# Patient Record
Sex: Male | Born: 1935 | Race: White | Hispanic: No | Smoking: Former smoker
Health system: Southern US, Community
[De-identification: ages and names within clinical notes are randomized; demographics above are authoritative.]

## PROBLEM LIST (undated history)

## (undated) DIAGNOSIS — L0390 Cellulitis, unspecified: Secondary | ICD-10-CM

## (undated) DIAGNOSIS — C8590 Non-Hodgkin lymphoma, unspecified, unspecified site: Secondary | ICD-10-CM

## (undated) DIAGNOSIS — M1120 Other chondrocalcinosis, unspecified site: Secondary | ICD-10-CM

## (undated) DIAGNOSIS — I491 Atrial premature depolarization: Secondary | ICD-10-CM

## (undated) DIAGNOSIS — K589 Irritable bowel syndrome without diarrhea: Secondary | ICD-10-CM

## (undated) DIAGNOSIS — J349 Unspecified disorder of nose and nasal sinuses: Secondary | ICD-10-CM

## (undated) DIAGNOSIS — R252 Cramp and spasm: Secondary | ICD-10-CM

## (undated) DIAGNOSIS — M1712 Unilateral primary osteoarthritis, left knee: Secondary | ICD-10-CM

## (undated) DIAGNOSIS — R0602 Shortness of breath: Secondary | ICD-10-CM

## (undated) DIAGNOSIS — M1990 Unspecified osteoarthritis, unspecified site: Secondary | ICD-10-CM

## (undated) DIAGNOSIS — N419 Inflammatory disease of prostate, unspecified: Secondary | ICD-10-CM

## (undated) DIAGNOSIS — H409 Unspecified glaucoma: Secondary | ICD-10-CM

## (undated) DIAGNOSIS — K7030 Alcoholic cirrhosis of liver without ascites: Secondary | ICD-10-CM

## (undated) DIAGNOSIS — F1010 Alcohol abuse, uncomplicated: Secondary | ICD-10-CM

## (undated) DIAGNOSIS — E785 Hyperlipidemia, unspecified: Secondary | ICD-10-CM

## (undated) DIAGNOSIS — G4700 Insomnia, unspecified: Secondary | ICD-10-CM

## (undated) DIAGNOSIS — T4145XA Adverse effect of unspecified anesthetic, initial encounter: Secondary | ICD-10-CM

## (undated) DIAGNOSIS — I10 Essential (primary) hypertension: Secondary | ICD-10-CM

## (undated) DIAGNOSIS — H3530 Unspecified macular degeneration: Secondary | ICD-10-CM

## (undated) DIAGNOSIS — K219 Gastro-esophageal reflux disease without esophagitis: Secondary | ICD-10-CM

## (undated) HISTORY — DX: Unspecified glaucoma: H40.9

## (undated) HISTORY — DX: Irritable bowel syndrome, unspecified: K58.9

## (undated) HISTORY — DX: Unspecified osteoarthritis, unspecified site: M19.90

## (undated) HISTORY — DX: Unspecified macular degeneration: H35.30

## (undated) HISTORY — DX: Unspecified disorder of nose and nasal sinuses: J34.9

## (undated) HISTORY — DX: Hyperlipidemia, unspecified: E78.5

## (undated) HISTORY — PX: HERNIA REPAIR: SHX51

## (undated) HISTORY — DX: Alcohol abuse, uncomplicated: F10.10

## (undated) HISTORY — DX: Cellulitis, unspecified: L03.90

## (undated) HISTORY — DX: Gastro-esophageal reflux disease without esophagitis: K21.9

## (undated) HISTORY — PX: ROTATOR CUFF REPAIR: SHX139

## (undated) HISTORY — DX: Other chondrocalcinosis, unspecified site: M11.20

## (undated) HISTORY — DX: Insomnia, unspecified: G47.00

## (undated) HISTORY — PX: COLONOSCOPY: SHX174

## (undated) HISTORY — DX: Atrial premature depolarization: I49.1

## (undated) HISTORY — DX: Inflammatory disease of prostate, unspecified: N41.9

## (undated) HISTORY — DX: Alcoholic cirrhosis of liver without ascites: K70.30

## (undated) HISTORY — DX: Non-Hodgkin lymphoma, unspecified, unspecified site: C85.90

---

## 1977-06-24 HISTORY — PX: INGUINAL HERNIA REPAIR: SHX194

## 1998-09-14 ENCOUNTER — Encounter: Payer: Self-pay | Admitting: Internal Medicine

## 1998-09-14 ENCOUNTER — Ambulatory Visit (HOSPITAL_COMMUNITY): Admission: RE | Admit: 1998-09-14 | Discharge: 1998-09-14 | Payer: Self-pay | Admitting: Internal Medicine

## 1999-02-14 ENCOUNTER — Ambulatory Visit (HOSPITAL_COMMUNITY): Admission: RE | Admit: 1999-02-14 | Discharge: 1999-02-14 | Payer: Self-pay | Admitting: Internal Medicine

## 2000-07-06 ENCOUNTER — Inpatient Hospital Stay (HOSPITAL_COMMUNITY): Admission: EM | Admit: 2000-07-06 | Discharge: 2000-07-10 | Payer: Self-pay | Admitting: Internal Medicine

## 2000-07-08 ENCOUNTER — Encounter: Payer: Self-pay | Admitting: Internal Medicine

## 2000-07-15 ENCOUNTER — Encounter: Payer: Self-pay | Admitting: Internal Medicine

## 2000-07-15 ENCOUNTER — Ambulatory Visit (HOSPITAL_COMMUNITY): Admission: RE | Admit: 2000-07-15 | Discharge: 2000-07-15 | Payer: Self-pay | Admitting: Internal Medicine

## 2002-01-18 ENCOUNTER — Ambulatory Visit (HOSPITAL_COMMUNITY): Admission: RE | Admit: 2002-01-18 | Discharge: 2002-01-18 | Payer: Self-pay | Admitting: Internal Medicine

## 2002-01-18 ENCOUNTER — Encounter: Payer: Self-pay | Admitting: Internal Medicine

## 2002-01-27 ENCOUNTER — Encounter: Payer: Self-pay | Admitting: Gastroenterology

## 2002-01-27 HISTORY — PX: ESOPHAGOGASTRODUODENOSCOPY: SHX1529

## 2004-05-15 ENCOUNTER — Ambulatory Visit: Payer: Self-pay | Admitting: Internal Medicine

## 2004-06-28 ENCOUNTER — Ambulatory Visit: Payer: Self-pay | Admitting: Internal Medicine

## 2004-08-30 ENCOUNTER — Ambulatory Visit: Payer: Self-pay | Admitting: Internal Medicine

## 2004-09-04 ENCOUNTER — Ambulatory Visit: Payer: Self-pay | Admitting: Internal Medicine

## 2004-09-06 ENCOUNTER — Ambulatory Visit: Payer: Self-pay | Admitting: Internal Medicine

## 2004-10-01 ENCOUNTER — Ambulatory Visit: Payer: Self-pay

## 2004-10-02 ENCOUNTER — Ambulatory Visit: Payer: Self-pay | Admitting: Pulmonary Disease

## 2004-10-03 ENCOUNTER — Ambulatory Visit: Payer: Self-pay | Admitting: Internal Medicine

## 2004-10-08 ENCOUNTER — Ambulatory Visit: Payer: Self-pay | Admitting: Internal Medicine

## 2004-10-14 ENCOUNTER — Ambulatory Visit (HOSPITAL_COMMUNITY): Admission: RE | Admit: 2004-10-14 | Discharge: 2004-10-14 | Payer: Self-pay | Admitting: Internal Medicine

## 2004-10-19 ENCOUNTER — Inpatient Hospital Stay (HOSPITAL_COMMUNITY): Admission: RE | Admit: 2004-10-19 | Discharge: 2004-10-20 | Payer: Self-pay | Admitting: Neurosurgery

## 2004-11-02 ENCOUNTER — Ambulatory Visit: Payer: Self-pay | Admitting: Internal Medicine

## 2004-11-07 ENCOUNTER — Ambulatory Visit: Payer: Self-pay | Admitting: Pulmonary Disease

## 2005-01-14 ENCOUNTER — Ambulatory Visit: Payer: Self-pay | Admitting: Pulmonary Disease

## 2005-03-12 ENCOUNTER — Ambulatory Visit: Payer: Self-pay | Admitting: Internal Medicine

## 2005-03-13 ENCOUNTER — Ambulatory Visit: Payer: Self-pay | Admitting: Internal Medicine

## 2005-03-25 ENCOUNTER — Ambulatory Visit: Payer: Self-pay | Admitting: Internal Medicine

## 2005-04-11 ENCOUNTER — Ambulatory Visit: Payer: Self-pay | Admitting: Gastroenterology

## 2005-04-24 ENCOUNTER — Encounter (INDEPENDENT_AMBULATORY_CARE_PROVIDER_SITE_OTHER): Payer: Self-pay | Admitting: Specialist

## 2005-04-24 ENCOUNTER — Ambulatory Visit: Payer: Self-pay | Admitting: Gastroenterology

## 2005-04-24 LAB — HM COLONOSCOPY

## 2005-04-25 ENCOUNTER — Ambulatory Visit: Payer: Self-pay | Admitting: Gastroenterology

## 2005-04-29 ENCOUNTER — Ambulatory Visit: Payer: Self-pay | Admitting: Gastroenterology

## 2005-05-07 ENCOUNTER — Ambulatory Visit: Payer: Self-pay | Admitting: Gastroenterology

## 2005-05-23 ENCOUNTER — Ambulatory Visit: Payer: Self-pay | Admitting: Internal Medicine

## 2005-05-27 ENCOUNTER — Ambulatory Visit: Payer: Self-pay | Admitting: Psychology

## 2005-05-28 ENCOUNTER — Ambulatory Visit: Payer: Self-pay | Admitting: Gastroenterology

## 2005-06-04 ENCOUNTER — Ambulatory Visit: Payer: Self-pay | Admitting: Psychology

## 2005-07-02 ENCOUNTER — Ambulatory Visit: Payer: Self-pay | Admitting: Psychology

## 2005-07-04 ENCOUNTER — Ambulatory Visit: Payer: Self-pay | Admitting: Gastroenterology

## 2005-07-15 ENCOUNTER — Ambulatory Visit: Payer: Self-pay | Admitting: Internal Medicine

## 2005-07-18 ENCOUNTER — Ambulatory Visit: Payer: Self-pay | Admitting: Psychology

## 2005-08-01 ENCOUNTER — Ambulatory Visit: Payer: Self-pay | Admitting: Psychology

## 2005-08-16 ENCOUNTER — Ambulatory Visit: Payer: Self-pay | Admitting: Psychology

## 2005-08-19 ENCOUNTER — Ambulatory Visit: Payer: Self-pay | Admitting: Internal Medicine

## 2005-09-03 ENCOUNTER — Ambulatory Visit: Payer: Self-pay | Admitting: Gastroenterology

## 2005-09-04 ENCOUNTER — Ambulatory Visit: Payer: Self-pay | Admitting: Psychology

## 2005-09-06 ENCOUNTER — Encounter (INDEPENDENT_AMBULATORY_CARE_PROVIDER_SITE_OTHER): Payer: Self-pay | Admitting: Otolaryngology

## 2005-09-06 ENCOUNTER — Encounter (INDEPENDENT_AMBULATORY_CARE_PROVIDER_SITE_OTHER): Payer: Self-pay | Admitting: Specialist

## 2005-09-06 ENCOUNTER — Ambulatory Visit (HOSPITAL_BASED_OUTPATIENT_CLINIC_OR_DEPARTMENT_OTHER): Admission: RE | Admit: 2005-09-06 | Discharge: 2005-09-06 | Payer: Self-pay | Admitting: Otolaryngology

## 2005-09-11 ENCOUNTER — Ambulatory Visit: Payer: Self-pay | Admitting: Oncology

## 2005-09-16 ENCOUNTER — Ambulatory Visit: Payer: Self-pay | Admitting: Internal Medicine

## 2005-09-19 ENCOUNTER — Ambulatory Visit (HOSPITAL_COMMUNITY): Admission: RE | Admit: 2005-09-19 | Discharge: 2005-09-19 | Payer: Self-pay | Admitting: Internal Medicine

## 2005-09-24 ENCOUNTER — Ambulatory Visit (HOSPITAL_COMMUNITY): Admission: RE | Admit: 2005-09-24 | Discharge: 2005-09-24 | Payer: Self-pay | Admitting: Internal Medicine

## 2005-09-26 ENCOUNTER — Ambulatory Visit: Payer: Self-pay | Admitting: Psychology

## 2005-09-30 ENCOUNTER — Ambulatory Visit (HOSPITAL_COMMUNITY): Admission: RE | Admit: 2005-09-30 | Discharge: 2005-09-30 | Payer: Self-pay | Admitting: Oncology

## 2005-10-03 ENCOUNTER — Ambulatory Visit: Payer: Self-pay

## 2005-10-03 ENCOUNTER — Ambulatory Visit: Payer: Self-pay | Admitting: Gastroenterology

## 2005-10-03 ENCOUNTER — Encounter: Payer: Self-pay | Admitting: Cardiology

## 2005-10-04 ENCOUNTER — Ambulatory Visit: Admission: RE | Admit: 2005-10-04 | Discharge: 2005-10-29 | Payer: Self-pay | Admitting: Radiation Oncology

## 2005-10-14 ENCOUNTER — Ambulatory Visit: Payer: Self-pay | Admitting: Internal Medicine

## 2005-10-23 ENCOUNTER — Ambulatory Visit (HOSPITAL_COMMUNITY): Admission: RE | Admit: 2005-10-23 | Discharge: 2005-10-23 | Payer: Self-pay | Admitting: Oncology

## 2005-10-24 ENCOUNTER — Ambulatory Visit: Payer: Self-pay | Admitting: Psychology

## 2005-10-24 LAB — CBC WITH DIFFERENTIAL/PLATELET
Basophils Absolute: 0 10*3/uL (ref 0.0–0.1)
EOS%: 2.8 % (ref 0.0–7.0)
Eosinophils Absolute: 0.2 10*3/uL (ref 0.0–0.5)
HCT: 45.1 % (ref 38.7–49.9)
HGB: 15.3 g/dL (ref 13.0–17.1)
MONO#: 0.7 10*3/uL (ref 0.1–0.9)
NEUT#: 3.7 10*3/uL (ref 1.5–6.5)
NEUT%: 56.8 % (ref 40.0–75.0)
RDW: 14.9 % — ABNORMAL HIGH (ref 11.2–14.6)
WBC: 6.4 10*3/uL (ref 4.0–10.0)
lymph#: 1.9 10*3/uL (ref 0.9–3.3)

## 2005-10-24 LAB — COMPREHENSIVE METABOLIC PANEL
AST: 25 U/L (ref 0–37)
Albumin: 3.7 g/dL (ref 3.5–5.2)
BUN: 12 mg/dL (ref 6–23)
CO2: 31 mEq/L (ref 19–32)
Calcium: 9 mg/dL (ref 8.4–10.5)
Chloride: 102 mEq/L (ref 96–112)
Creatinine, Ser: 1.1 mg/dL (ref 0.4–1.5)
Glucose, Bld: 120 mg/dL — ABNORMAL HIGH (ref 70–99)
Potassium: 4 mEq/L (ref 3.5–5.3)

## 2005-11-12 ENCOUNTER — Ambulatory Visit: Payer: Self-pay | Admitting: Oncology

## 2005-11-14 LAB — CBC WITH DIFFERENTIAL/PLATELET
BASO%: 2 % (ref 0.0–2.0)
EOS%: 0.3 % (ref 0.0–7.0)
Eosinophils Absolute: 0 10*3/uL (ref 0.0–0.5)
MCHC: 35.1 g/dL (ref 32.0–35.9)
MCV: 87.3 fL (ref 81.6–98.0)
MONO%: 10.9 % (ref 0.0–13.0)
NEUT#: 7.3 10*3/uL — ABNORMAL HIGH (ref 1.5–6.5)
RBC: 5.05 10*6/uL (ref 4.20–5.71)
RDW: 13 % (ref 11.2–14.6)

## 2005-11-14 LAB — COMPREHENSIVE METABOLIC PANEL
ALT: 22 U/L (ref 0–40)
AST: 21 U/L (ref 0–37)
Albumin: 3.8 g/dL (ref 3.5–5.2)
Alkaline Phosphatase: 65 U/L (ref 39–117)
Potassium: 3.7 mEq/L (ref 3.5–5.3)
Sodium: 137 mEq/L (ref 135–145)
Total Bilirubin: 0.4 mg/dL (ref 0.3–1.2)
Total Protein: 6.6 g/dL (ref 6.0–8.3)

## 2005-11-21 ENCOUNTER — Ambulatory Visit: Payer: Self-pay | Admitting: Psychology

## 2005-11-25 LAB — COMPREHENSIVE METABOLIC PANEL
ALT: 17 U/L (ref 0–40)
AST: 20 U/L (ref 0–37)
Albumin: 3.9 g/dL (ref 3.5–5.2)
CO2: 25 mEq/L (ref 19–32)
Calcium: 8.6 mg/dL (ref 8.4–10.5)
Chloride: 103 mEq/L (ref 96–112)
Creatinine, Ser: 0.92 mg/dL (ref 0.40–1.50)
Potassium: 3.4 mEq/L — ABNORMAL LOW (ref 3.5–5.3)
Sodium: 141 mEq/L (ref 135–145)
Total Protein: 6.2 g/dL (ref 6.0–8.3)

## 2005-11-25 LAB — CBC WITH DIFFERENTIAL/PLATELET
BASO%: 4.5 % — ABNORMAL HIGH (ref 0.0–2.0)
EOS%: 0.9 % (ref 0.0–7.0)
HCT: 38.3 % — ABNORMAL LOW (ref 38.7–49.9)
MCHC: 34.9 g/dL (ref 32.0–35.9)
MONO#: 0.3 10*3/uL (ref 0.1–0.9)
NEUT%: 20.5 % — ABNORMAL LOW (ref 40.0–75.0)
RDW: 14.3 % (ref 11.2–14.6)
WBC: 1.5 10*3/uL — ABNORMAL LOW (ref 4.0–10.0)
lymph#: 0.8 10*3/uL — ABNORMAL LOW (ref 0.9–3.3)

## 2005-12-02 LAB — CBC WITH DIFFERENTIAL/PLATELET
Basophils Absolute: 0 10*3/uL (ref 0.0–0.1)
LYMPH%: 16.3 % (ref 14.0–48.0)
MCH: 30.6 pg (ref 28.0–33.4)
MCHC: 34.3 g/dL (ref 32.0–35.9)
MONO#: 1 10*3/uL — ABNORMAL HIGH (ref 0.1–0.9)
MONO%: 17.6 % — ABNORMAL HIGH (ref 0.0–13.0)
NEUT%: 65 % (ref 40.0–75.0)
RBC: 4.5 10*6/uL (ref 4.20–5.71)
RDW: 14.9 % — ABNORMAL HIGH (ref 11.2–14.6)
WBC: 5.8 10*3/uL (ref 4.0–10.0)
lymph#: 0.9 10*3/uL (ref 0.9–3.3)

## 2005-12-02 LAB — COMPREHENSIVE METABOLIC PANEL
AST: 19 U/L (ref 0–37)
Alkaline Phosphatase: 51 U/L (ref 39–117)
BUN: 17 mg/dL (ref 6–23)
Calcium: 8.9 mg/dL (ref 8.4–10.5)
Creatinine, Ser: 0.92 mg/dL (ref 0.40–1.50)
Glucose, Bld: 115 mg/dL — ABNORMAL HIGH (ref 70–99)

## 2005-12-05 ENCOUNTER — Ambulatory Visit (HOSPITAL_COMMUNITY): Admission: RE | Admit: 2005-12-05 | Discharge: 2005-12-05 | Payer: Self-pay | Admitting: Oncology

## 2005-12-05 LAB — CBC WITH DIFFERENTIAL/PLATELET
Basophils Absolute: 0 10*3/uL (ref 0.0–0.1)
EOS%: 0.3 % (ref 0.0–7.0)
Eosinophils Absolute: 0 10*3/uL (ref 0.0–0.5)
HCT: 39.5 % (ref 38.7–49.9)
HGB: 13.4 g/dL (ref 13.0–17.1)
MCH: 30.4 pg (ref 28.0–33.4)
MCV: 89.2 fL (ref 81.6–98.0)
NEUT#: 7.8 10*3/uL — ABNORMAL HIGH (ref 1.5–6.5)
NEUT%: 79.1 % — ABNORMAL HIGH (ref 40.0–75.0)
RDW: 14.3 % (ref 11.2–14.6)
lymph#: 0.9 10*3/uL (ref 0.9–3.3)

## 2005-12-05 LAB — COMPREHENSIVE METABOLIC PANEL
AST: 19 U/L (ref 0–37)
Albumin: 2.8 g/dL — ABNORMAL LOW (ref 3.5–5.2)
BUN: 14 mg/dL (ref 6–23)
Calcium: 9 mg/dL (ref 8.4–10.5)
Chloride: 94 mEq/L — ABNORMAL LOW (ref 96–112)
Creatinine, Ser: 1.02 mg/dL (ref 0.40–1.50)
Glucose, Bld: 150 mg/dL — ABNORMAL HIGH (ref 70–99)
Potassium: 3.6 mEq/L (ref 3.5–5.3)

## 2005-12-26 LAB — URIC ACID: Uric Acid, Serum: 5.7 mg/dL (ref 2.4–7.0)

## 2005-12-26 LAB — COMPREHENSIVE METABOLIC PANEL
BUN: 13 mg/dL (ref 6–23)
CO2: 27 mEq/L (ref 19–32)
Calcium: 9 mg/dL (ref 8.4–10.5)
Chloride: 101 mEq/L (ref 96–112)
Creatinine, Ser: 1.1 mg/dL (ref 0.40–1.50)
Total Bilirubin: 0.5 mg/dL (ref 0.3–1.2)

## 2005-12-26 LAB — CBC WITH DIFFERENTIAL/PLATELET
BASO%: 2.2 % — ABNORMAL HIGH (ref 0.0–2.0)
Basophils Absolute: 0.1 10*3/uL (ref 0.0–0.1)
HCT: 36.4 % — ABNORMAL LOW (ref 38.7–49.9)
HGB: 12.9 g/dL — ABNORMAL LOW (ref 13.0–17.1)
LYMPH%: 20.5 % (ref 14.0–48.0)
MCH: 30.3 pg (ref 28.0–33.4)
MCHC: 35.4 g/dL (ref 32.0–35.9)
MONO#: 1.2 10*3/uL — ABNORMAL HIGH (ref 0.1–0.9)
NEUT%: 57.8 % (ref 40.0–75.0)
Platelets: 318 10*3/uL (ref 145–400)
WBC: 6.2 10*3/uL (ref 4.0–10.0)
lymph#: 1.3 10*3/uL (ref 0.9–3.3)

## 2005-12-26 LAB — LACTATE DEHYDROGENASE: LDH: 175 U/L (ref 94–250)

## 2006-01-03 ENCOUNTER — Ambulatory Visit: Payer: Self-pay | Admitting: Psychology

## 2006-01-06 ENCOUNTER — Ambulatory Visit (HOSPITAL_COMMUNITY): Admission: RE | Admit: 2006-01-06 | Discharge: 2006-01-06 | Payer: Self-pay | Admitting: Oncology

## 2006-01-13 ENCOUNTER — Ambulatory Visit: Payer: Self-pay | Admitting: Oncology

## 2006-01-13 LAB — CBC WITH DIFFERENTIAL/PLATELET
Basophils Absolute: 0 10*3/uL (ref 0.0–0.1)
Eosinophils Absolute: 0 10*3/uL (ref 0.0–0.5)
HGB: 10.5 g/dL — ABNORMAL LOW (ref 13.0–17.1)
MONO#: 1 10*3/uL — ABNORMAL HIGH (ref 0.1–0.9)
MONO%: 16.1 % — ABNORMAL HIGH (ref 0.0–13.0)
NEUT#: 3.1 10*3/uL (ref 1.5–6.5)
RBC: 3.47 10*6/uL — ABNORMAL LOW (ref 4.20–5.71)
RDW: 16.1 % — ABNORMAL HIGH (ref 11.2–14.6)
WBC: 6.2 10*3/uL (ref 4.0–10.0)
lymph#: 2 10*3/uL (ref 0.9–3.3)

## 2006-01-13 LAB — COMPREHENSIVE METABOLIC PANEL
Albumin: 3.7 g/dL (ref 3.5–5.2)
Alkaline Phosphatase: 52 U/L (ref 39–117)
BUN: 12 mg/dL (ref 6–23)
CO2: 26 mEq/L (ref 19–32)
Glucose, Bld: 117 mg/dL — ABNORMAL HIGH (ref 70–99)
Total Bilirubin: 0.4 mg/dL (ref 0.3–1.2)

## 2006-01-13 LAB — LACTATE DEHYDROGENASE: LDH: 198 U/L (ref 94–250)

## 2006-01-13 LAB — URIC ACID: Uric Acid, Serum: 5.8 mg/dL (ref 2.4–7.0)

## 2006-01-30 ENCOUNTER — Ambulatory Visit: Payer: Self-pay | Admitting: Psychology

## 2006-02-06 LAB — CBC WITH DIFFERENTIAL/PLATELET
Basophils Absolute: 0 10*3/uL (ref 0.0–0.1)
Eosinophils Absolute: 0 10*3/uL (ref 0.0–0.5)
HCT: 32.4 % — ABNORMAL LOW (ref 38.7–49.9)
HGB: 11 g/dL — ABNORMAL LOW (ref 13.0–17.1)
MCV: 88 fL (ref 81.6–98.0)
NEUT#: 5.3 10*3/uL (ref 1.5–6.5)
NEUT%: 69 % (ref 40.0–75.0)
RDW: 18.8 % — ABNORMAL HIGH (ref 11.2–14.6)
lymph#: 1.2 10*3/uL (ref 0.9–3.3)

## 2006-02-06 LAB — COMPREHENSIVE METABOLIC PANEL
Albumin: 4 g/dL (ref 3.5–5.2)
BUN: 16 mg/dL (ref 6–23)
Calcium: 9.4 mg/dL (ref 8.4–10.5)
Chloride: 99 mEq/L (ref 96–112)
Creatinine, Ser: 0.96 mg/dL (ref 0.40–1.50)
Glucose, Bld: 118 mg/dL — ABNORMAL HIGH (ref 70–99)
Potassium: 4.1 mEq/L (ref 3.5–5.3)

## 2006-02-13 ENCOUNTER — Ambulatory Visit: Payer: Self-pay | Admitting: Psychology

## 2006-02-26 ENCOUNTER — Ambulatory Visit (HOSPITAL_COMMUNITY): Admission: RE | Admit: 2006-02-26 | Discharge: 2006-02-26 | Payer: Self-pay | Admitting: Oncology

## 2006-02-27 ENCOUNTER — Ambulatory Visit: Payer: Self-pay | Admitting: Psychology

## 2006-03-03 ENCOUNTER — Ambulatory Visit: Payer: Self-pay | Admitting: Oncology

## 2006-03-05 LAB — BASIC METABOLIC PANEL
BUN: 17 mg/dL (ref 6–23)
CO2: 27 mEq/L (ref 19–32)
Chloride: 100 mEq/L (ref 96–112)
Potassium: 3.6 mEq/L (ref 3.5–5.3)

## 2006-03-05 LAB — CBC WITH DIFFERENTIAL/PLATELET
Basophils Absolute: 0.1 10*3/uL (ref 0.0–0.1)
EOS%: 0.3 % (ref 0.0–7.0)
Eosinophils Absolute: 0 10*3/uL (ref 0.0–0.5)
HGB: 11.3 g/dL — ABNORMAL LOW (ref 13.0–17.1)
MCH: 29.8 pg (ref 28.0–33.4)
MONO#: 1.1 10*3/uL — ABNORMAL HIGH (ref 0.1–0.9)
NEUT#: 6.7 10*3/uL — ABNORMAL HIGH (ref 1.5–6.5)
RDW: 20 % — ABNORMAL HIGH (ref 11.2–14.6)
WBC: 9.1 10*3/uL (ref 4.0–10.0)
lymph#: 1.2 10*3/uL (ref 0.9–3.3)

## 2006-03-13 ENCOUNTER — Ambulatory Visit: Payer: Self-pay | Admitting: Psychology

## 2006-03-25 ENCOUNTER — Ambulatory Visit: Payer: Self-pay | Admitting: Internal Medicine

## 2006-03-31 ENCOUNTER — Ambulatory Visit: Payer: Self-pay | Admitting: Internal Medicine

## 2006-04-03 ENCOUNTER — Ambulatory Visit: Payer: Self-pay | Admitting: Psychology

## 2006-04-18 ENCOUNTER — Ambulatory Visit: Payer: Self-pay | Admitting: Psychology

## 2006-04-21 ENCOUNTER — Ambulatory Visit: Payer: Self-pay | Admitting: Oncology

## 2006-04-23 ENCOUNTER — Ambulatory Visit: Payer: Self-pay | Admitting: Internal Medicine

## 2006-04-23 LAB — CONVERTED CEMR LAB
Cholesterol: 231 mg/dL (ref 0–200)
HDL: 91.5 mg/dL (ref >39.0)

## 2006-04-29 ENCOUNTER — Ambulatory Visit: Payer: Self-pay | Admitting: Internal Medicine

## 2006-05-06 ENCOUNTER — Ambulatory Visit: Payer: Self-pay | Admitting: *Deleted

## 2006-05-19 ENCOUNTER — Ambulatory Visit: Payer: Self-pay | Admitting: *Deleted

## 2006-05-28 ENCOUNTER — Ambulatory Visit: Payer: Self-pay | Admitting: Internal Medicine

## 2006-06-04 ENCOUNTER — Ambulatory Visit: Payer: Self-pay | Admitting: Oncology

## 2006-06-04 LAB — COMPREHENSIVE METABOLIC PANEL
ALT: 12 U/L (ref 0–53)
AST: 16 U/L (ref 0–37)
BUN: 17 mg/dL (ref 6–23)
Calcium: 8.9 mg/dL (ref 8.4–10.5)
Chloride: 97 mEq/L (ref 96–112)
Creatinine, Ser: 0.84 mg/dL (ref 0.40–1.50)
Total Bilirubin: 0.4 mg/dL (ref 0.3–1.2)

## 2006-06-04 LAB — CBC WITH DIFFERENTIAL/PLATELET
Basophils Absolute: 0 10*3/uL (ref 0.0–0.1)
Eosinophils Absolute: 0.1 10*3/uL (ref 0.0–0.5)
HCT: 38.6 % — ABNORMAL LOW (ref 38.7–49.9)
HGB: 13.3 g/dL (ref 13.0–17.1)
LYMPH%: 32.8 % (ref 14.0–48.0)
MCHC: 34.4 g/dL (ref 32.0–35.9)
MONO#: 0.9 10*3/uL (ref 0.1–0.9)
NEUT%: 50.1 % (ref 40.0–75.0)
Platelets: 229 10*3/uL (ref 145–400)
WBC: 5.6 10*3/uL (ref 4.0–10.0)
lymph#: 1.8 10*3/uL (ref 0.9–3.3)

## 2006-06-04 LAB — LACTATE DEHYDROGENASE: LDH: 218 U/L (ref 94–250)

## 2006-06-05 ENCOUNTER — Ambulatory Visit: Payer: Self-pay | Admitting: *Deleted

## 2006-06-11 ENCOUNTER — Ambulatory Visit: Payer: Self-pay

## 2006-06-11 ENCOUNTER — Ambulatory Visit: Payer: Self-pay | Admitting: *Deleted

## 2006-06-30 ENCOUNTER — Ambulatory Visit: Payer: Self-pay

## 2006-07-01 ENCOUNTER — Ambulatory Visit: Payer: Self-pay | Admitting: Cardiology

## 2006-07-10 ENCOUNTER — Ambulatory Visit: Payer: Self-pay | Admitting: Psychology

## 2006-07-21 ENCOUNTER — Ambulatory Visit: Payer: Self-pay | Admitting: Oncology

## 2006-08-01 ENCOUNTER — Ambulatory Visit: Payer: Self-pay | Admitting: *Deleted

## 2006-08-01 LAB — CONVERTED CEMR LAB
BUN: 13 mg/dL (ref 6–23)
CO2: 31 meq/L (ref 19–32)
Calcium: 9.3 mg/dL (ref 8.4–10.5)
Chloride: 103 meq/L (ref 96–112)
Glucose, Bld: 105 mg/dL — ABNORMAL HIGH (ref 70–99)

## 2006-08-25 ENCOUNTER — Ambulatory Visit (HOSPITAL_COMMUNITY): Admission: RE | Admit: 2006-08-25 | Discharge: 2006-08-25 | Payer: Self-pay | Admitting: Oncology

## 2006-08-27 LAB — COMPREHENSIVE METABOLIC PANEL
AST: 15 U/L (ref 0–37)
Albumin: 4.3 g/dL (ref 3.5–5.2)
Alkaline Phosphatase: 63 U/L (ref 39–117)
BUN: 18 mg/dL (ref 6–23)
Glucose, Bld: 122 mg/dL — ABNORMAL HIGH (ref 70–99)
Potassium: 4.5 mEq/L (ref 3.5–5.3)
Sodium: 140 mEq/L (ref 135–145)
Total Bilirubin: 0.4 mg/dL (ref 0.3–1.2)
Total Protein: 6.6 g/dL (ref 6.0–8.3)

## 2006-08-27 LAB — CBC WITH DIFFERENTIAL/PLATELET
EOS%: 2.8 % (ref 0.0–7.0)
LYMPH%: 33.1 % (ref 14.0–48.0)
MCH: 31.5 pg (ref 28.0–33.4)
MCV: 90.6 fL (ref 81.6–98.0)
MONO%: 11.7 % (ref 0.0–13.0)
Platelets: 201 10*3/uL (ref 145–400)
RBC: 4.49 10*6/uL (ref 4.20–5.71)
RDW: 14.4 % (ref 11.2–14.6)

## 2006-09-01 ENCOUNTER — Ambulatory Visit (HOSPITAL_COMMUNITY): Admission: RE | Admit: 2006-09-01 | Discharge: 2006-09-01 | Payer: Self-pay | Admitting: Oncology

## 2006-10-10 ENCOUNTER — Ambulatory Visit: Payer: Self-pay | Admitting: Internal Medicine

## 2006-10-13 ENCOUNTER — Ambulatory Visit: Payer: Self-pay | Admitting: *Deleted

## 2006-10-13 LAB — CONVERTED CEMR LAB
ALT: 17 units/L (ref 0–40)
Albumin: 3.7 g/dL (ref 3.5–5.2)
Direct LDL: 125.8 mg/dL
Total Bilirubin: 0.7 mg/dL (ref 0.3–1.2)
Total CHOL/HDL Ratio: 3.4
Total Protein: 6.4 g/dL (ref 6.0–8.3)

## 2006-10-15 ENCOUNTER — Ambulatory Visit: Payer: Self-pay | Admitting: *Deleted

## 2006-10-16 ENCOUNTER — Ambulatory Visit: Payer: Self-pay | Admitting: *Deleted

## 2006-10-16 LAB — CONVERTED CEMR LAB
Chloride: 104 meq/L (ref 96–112)
GFR calc Af Amer: 121 mL/min
GFR calc non Af Amer: 100 mL/min
Glucose, Bld: 107 mg/dL — ABNORMAL HIGH (ref 70–99)
Sodium: 138 meq/L (ref 135–145)

## 2006-11-05 ENCOUNTER — Ambulatory Visit: Payer: Self-pay | Admitting: Internal Medicine

## 2006-11-05 LAB — CONVERTED CEMR LAB
ALT: 17 units/L (ref 0–40)
Basophils Absolute: 0 10*3/uL (ref 0.0–0.1)
Basophils Relative: 0.6 % (ref 0.0–1.0)
Bilirubin, Direct: 0.1 mg/dL (ref 0.0–0.3)
Eosinophils Absolute: 0.3 10*3/uL (ref 0.0–0.6)
HCT: 39.8 % (ref 39.0–52.0)
Ketones, ur: NEGATIVE mg/dL
Monocytes Absolute: 0.9 10*3/uL — ABNORMAL HIGH (ref 0.2–0.7)
Neutro Abs: 3.7 10*3/uL (ref 1.4–7.7)
Neutrophils Relative %: 46.3 % (ref 43.0–77.0)
Platelets: 215 10*3/uL (ref 150–400)
Specific Gravity, Urine: 1.025 (ref 1.000–1.03)
Total Protein, Urine: NEGATIVE mg/dL
Urobilinogen, UA: 0.2 (ref 0.0–1.0)
WBC: 8.1 10*3/uL (ref 4.5–10.5)

## 2006-11-11 ENCOUNTER — Ambulatory Visit: Payer: Self-pay | Admitting: Internal Medicine

## 2006-11-25 ENCOUNTER — Ambulatory Visit: Payer: Self-pay | Admitting: Oncology

## 2006-11-27 LAB — COMPREHENSIVE METABOLIC PANEL
ALT: 15 U/L (ref 0–53)
AST: 17 U/L (ref 0–37)
Albumin: 4.6 g/dL (ref 3.5–5.2)
Alkaline Phosphatase: 63 U/L (ref 39–117)
BUN: 15 mg/dL (ref 6–23)
Calcium: 9.3 mg/dL (ref 8.4–10.5)
Chloride: 102 mEq/L (ref 96–112)
Potassium: 4.1 mEq/L (ref 3.5–5.3)

## 2006-11-27 LAB — CBC WITH DIFFERENTIAL/PLATELET
Eosinophils Absolute: 0.2 10*3/uL (ref 0.0–0.5)
HCT: 38.5 % — ABNORMAL LOW (ref 38.7–49.9)
LYMPH%: 29.8 % (ref 14.0–48.0)
MCV: 89.8 fL (ref 81.6–98.0)
MONO#: 0.7 10*3/uL (ref 0.1–0.9)
MONO%: 9.9 % (ref 0.0–13.0)
NEUT#: 4.2 10*3/uL (ref 1.5–6.5)
NEUT%: 57.5 % (ref 40.0–75.0)
Platelets: 206 10*3/uL (ref 145–400)
RBC: 4.29 10*6/uL (ref 4.20–5.71)
WBC: 7.3 10*3/uL (ref 4.0–10.0)

## 2006-11-28 ENCOUNTER — Ambulatory Visit: Payer: Self-pay | Admitting: *Deleted

## 2006-11-28 LAB — CONVERTED CEMR LAB
Albumin: 3.8 g/dL (ref 3.5–5.2)
Alkaline Phosphatase: 58 units/L (ref 39–117)
Cholesterol: 172 mg/dL (ref 0–200)
HDL: 63.1 mg/dL (ref >39.0)
LDL Cholesterol: 87 mg/dL (ref 0–99)
Total CHOL/HDL Ratio: 2.7
Triglycerides: 111 mg/dL (ref 0–149)
VLDL: 22 mg/dL (ref 0–40)

## 2007-01-05 ENCOUNTER — Ambulatory Visit: Payer: Self-pay | Admitting: Internal Medicine

## 2007-01-05 LAB — CONVERTED CEMR LAB
Crystals: NEGATIVE
Ketones, ur: NEGATIVE mg/dL
Mucus, UA: NEGATIVE
Total Protein, Urine: NEGATIVE mg/dL
Urine Glucose: NEGATIVE mg/dL

## 2007-01-27 ENCOUNTER — Ambulatory Visit: Payer: Self-pay | Admitting: Gastroenterology

## 2007-01-27 LAB — CONVERTED CEMR LAB
AFP-Tumor Marker: 5.6 ng/mL (ref 0.0–8.0)
Ammonia: 19 umol/L (ref 11–35)
INR: 0.8 — ABNORMAL LOW (ref 0.9–2.0)
Prothrombin Time: 10.9 s (ref 10.0–14.0)

## 2007-02-27 ENCOUNTER — Ambulatory Visit (HOSPITAL_COMMUNITY): Admission: RE | Admit: 2007-02-27 | Discharge: 2007-02-27 | Payer: Self-pay | Admitting: Oncology

## 2007-03-02 ENCOUNTER — Ambulatory Visit: Payer: Self-pay | Admitting: Oncology

## 2007-03-04 ENCOUNTER — Ambulatory Visit: Payer: Self-pay | Admitting: Internal Medicine

## 2007-03-04 LAB — CONVERTED CEMR LAB: PSA: 1.94 ng/mL (ref 0.10–4.00)

## 2007-03-04 LAB — CBC WITH DIFFERENTIAL/PLATELET
Basophils Absolute: 0 10*3/uL (ref 0.0–0.1)
EOS%: 4 % (ref 0.0–7.0)
Eosinophils Absolute: 0.2 10*3/uL (ref 0.0–0.5)
HGB: 13.7 g/dL (ref 13.0–17.1)
LYMPH%: 31 % (ref 14.0–48.0)
MCH: 31.8 pg (ref 28.0–33.4)
MCV: 91.2 fL (ref 81.6–98.0)
MONO%: 14.3 % — ABNORMAL HIGH (ref 0.0–13.0)
NEUT#: 2.7 10*3/uL (ref 1.5–6.5)
Platelets: 169 10*3/uL (ref 145–400)
RBC: 4.3 10*6/uL (ref 4.20–5.71)

## 2007-03-04 LAB — COMPREHENSIVE METABOLIC PANEL
AST: 18 U/L (ref 0–37)
Alkaline Phosphatase: 61 U/L (ref 39–117)
BUN: 12 mg/dL (ref 6–23)
Glucose, Bld: 110 mg/dL — ABNORMAL HIGH (ref 70–99)
Total Bilirubin: 0.4 mg/dL (ref 0.3–1.2)

## 2007-03-16 ENCOUNTER — Ambulatory Visit: Payer: Self-pay | Admitting: Internal Medicine

## 2007-04-15 ENCOUNTER — Ambulatory Visit: Payer: Self-pay | Admitting: Cardiology

## 2007-04-27 ENCOUNTER — Encounter: Payer: Self-pay | Admitting: Internal Medicine

## 2007-04-27 DIAGNOSIS — J438 Other emphysema: Secondary | ICD-10-CM | POA: Insufficient documentation

## 2007-04-27 DIAGNOSIS — K219 Gastro-esophageal reflux disease without esophagitis: Secondary | ICD-10-CM | POA: Insufficient documentation

## 2007-04-27 DIAGNOSIS — G5600 Carpal tunnel syndrome, unspecified upper limb: Secondary | ICD-10-CM

## 2007-06-01 ENCOUNTER — Ambulatory Visit: Payer: Self-pay | Admitting: Oncology

## 2007-06-03 ENCOUNTER — Encounter: Payer: Self-pay | Admitting: Internal Medicine

## 2007-06-03 LAB — CBC WITH DIFFERENTIAL/PLATELET
Eosinophils Absolute: 0.3 10*3/uL (ref 0.0–0.5)
HCT: 37.9 % — ABNORMAL LOW (ref 38.7–49.9)
LYMPH%: 27.9 % (ref 14.0–48.0)
MCHC: 34.7 g/dL (ref 32.0–35.9)
MCV: 91.5 fL (ref 81.6–98.0)
MONO%: 10.5 % (ref 0.0–13.0)
NEUT%: 58 % (ref 40.0–75.0)
Platelets: 190 10*3/uL (ref 145–400)
RBC: 4.14 10*6/uL — ABNORMAL LOW (ref 4.20–5.71)

## 2007-06-03 LAB — LACTATE DEHYDROGENASE: LDH: 171 U/L (ref 94–250)

## 2007-06-03 LAB — COMPREHENSIVE METABOLIC PANEL
Alkaline Phosphatase: 65 U/L (ref 39–117)
CO2: 24 mEq/L (ref 19–32)
Creatinine, Ser: 0.9 mg/dL (ref 0.40–1.50)
Glucose, Bld: 100 mg/dL — ABNORMAL HIGH (ref 70–99)
Sodium: 141 mEq/L (ref 135–145)
Total Bilirubin: 0.4 mg/dL (ref 0.3–1.2)
Total Protein: 6.6 g/dL (ref 6.0–8.3)

## 2007-07-03 ENCOUNTER — Encounter: Payer: Self-pay | Admitting: Internal Medicine

## 2007-08-20 ENCOUNTER — Ambulatory Visit: Payer: Self-pay | Admitting: Oncology

## 2007-08-25 ENCOUNTER — Ambulatory Visit (HOSPITAL_COMMUNITY): Admission: RE | Admit: 2007-08-25 | Discharge: 2007-08-25 | Payer: Self-pay | Admitting: Oncology

## 2007-08-25 LAB — COMPREHENSIVE METABOLIC PANEL
ALT: 23 U/L (ref 0–53)
AST: 25 U/L (ref 0–37)
Albumin: 3.8 g/dL (ref 3.5–5.2)
Alkaline Phosphatase: 61 U/L (ref 39–117)
BUN: 14 mg/dL (ref 6–23)
CO2: 30 mEq/L (ref 19–32)
Calcium: 8.9 mg/dL (ref 8.4–10.5)
Chloride: 101 mEq/L (ref 96–112)
Creatinine, Ser: 0.93 mg/dL (ref 0.40–1.50)
Glucose, Bld: 111 mg/dL — ABNORMAL HIGH (ref 70–99)
Potassium: 4.3 mEq/L (ref 3.5–5.3)
Sodium: 137 mEq/L (ref 135–145)
Total Bilirubin: 0.9 mg/dL (ref 0.3–1.2)
Total Protein: 6 g/dL (ref 6.0–8.3)

## 2007-08-25 LAB — CBC WITH DIFFERENTIAL/PLATELET
BASO%: 0.8 % (ref 0.0–2.0)
Eosinophils Absolute: 0.2 10*3/uL (ref 0.0–0.5)
HCT: 39.6 % (ref 38.7–49.9)
LYMPH%: 34.3 % (ref 14.0–48.0)
MCHC: 34.8 g/dL (ref 32.0–35.9)
MCV: 91.5 fL (ref 81.6–98.0)
MONO%: 8.6 % (ref 0.0–13.0)
NEUT%: 52.3 % (ref 40.0–75.0)
Platelets: 170 10*3/uL (ref 145–400)
RBC: 4.33 10*6/uL (ref 4.20–5.71)

## 2007-08-25 LAB — LACTATE DEHYDROGENASE: LDH: 163 U/L (ref 94–250)

## 2007-09-01 ENCOUNTER — Encounter: Payer: Self-pay | Admitting: Internal Medicine

## 2007-10-01 DIAGNOSIS — C8589 Other specified types of non-Hodgkin lymphoma, extranodal and solid organ sites: Secondary | ICD-10-CM | POA: Insufficient documentation

## 2007-10-01 DIAGNOSIS — K449 Diaphragmatic hernia without obstruction or gangrene: Secondary | ICD-10-CM | POA: Insufficient documentation

## 2007-10-01 DIAGNOSIS — E785 Hyperlipidemia, unspecified: Secondary | ICD-10-CM | POA: Insufficient documentation

## 2007-10-01 DIAGNOSIS — K5730 Diverticulosis of large intestine without perforation or abscess without bleeding: Secondary | ICD-10-CM | POA: Insufficient documentation

## 2007-10-01 DIAGNOSIS — D126 Benign neoplasm of colon, unspecified: Secondary | ICD-10-CM | POA: Insufficient documentation

## 2007-10-21 ENCOUNTER — Ambulatory Visit: Payer: Self-pay | Admitting: Internal Medicine

## 2007-10-21 DIAGNOSIS — K7030 Alcoholic cirrhosis of liver without ascites: Secondary | ICD-10-CM

## 2007-10-21 DIAGNOSIS — R252 Cramp and spasm: Secondary | ICD-10-CM

## 2007-10-21 DIAGNOSIS — K589 Irritable bowel syndrome without diarrhea: Secondary | ICD-10-CM

## 2007-10-21 DIAGNOSIS — N411 Chronic prostatitis: Secondary | ICD-10-CM | POA: Insufficient documentation

## 2007-10-21 LAB — CONVERTED CEMR LAB
ALT: 28 units/L (ref 0–53)
Ammonia: 26 umol/L (ref 11–35)
Bilirubin, Direct: 0.1 mg/dL (ref 0.0–0.3)
CO2: 31 meq/L (ref 19–32)
Calcium: 9.2 mg/dL (ref 8.4–10.5)
Folate: 7 ng/mL
GFR calc non Af Amer: 87 mL/min
Sodium: 140 meq/L (ref 135–145)
TSH: 0.63 microintl units/mL (ref 0.35–5.50)
Total Bilirubin: 0.8 mg/dL (ref 0.3–1.2)
VLDL: 43 mg/dL — ABNORMAL HIGH (ref 0–40)

## 2007-10-22 ENCOUNTER — Encounter: Payer: Self-pay | Admitting: Internal Medicine

## 2007-11-02 IMAGING — CR DG FEET 3 VIEWS BILAT
6 series · 6 of 6 positions shown · non-contrast
Comparison: none

CLINICAL DATA: Pain and swelling involving both feet, history of lymphoma.
 LEFT FOOT -   3 VIEW:
 There is no evidence of fracture or dislocation.  There is no evidence of arthropathy or other focal bone abnormality.  Soft tissues are unremarkable. Hallux valgus deformity noted.

[t foot ap right]
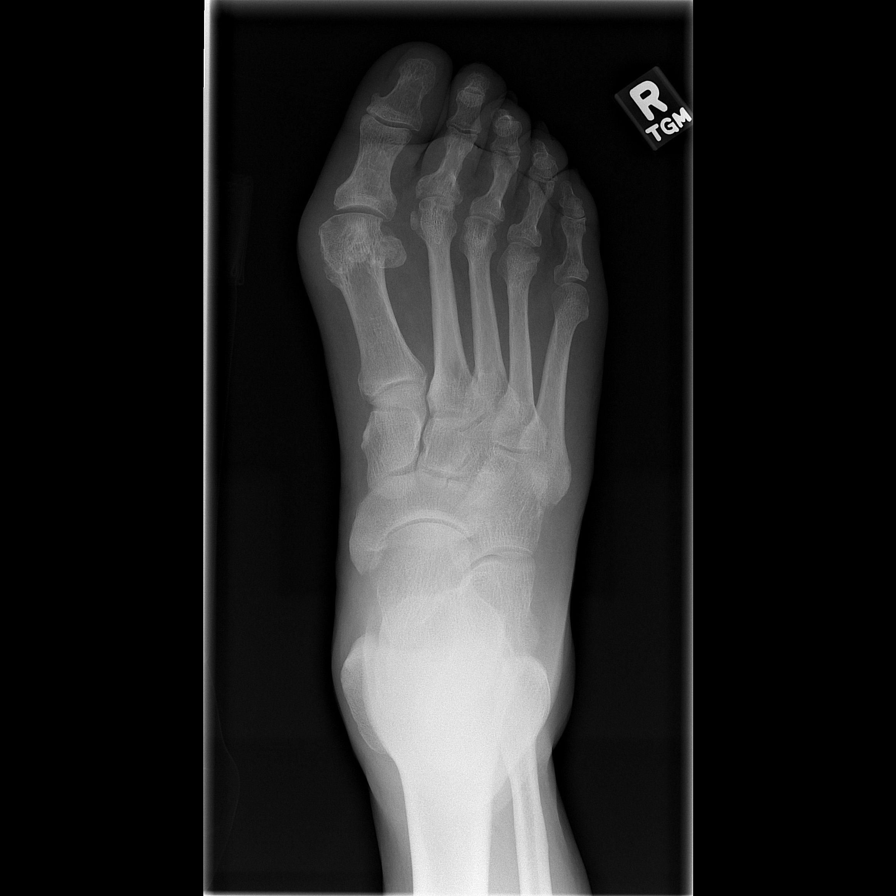

[t foot oblique right]
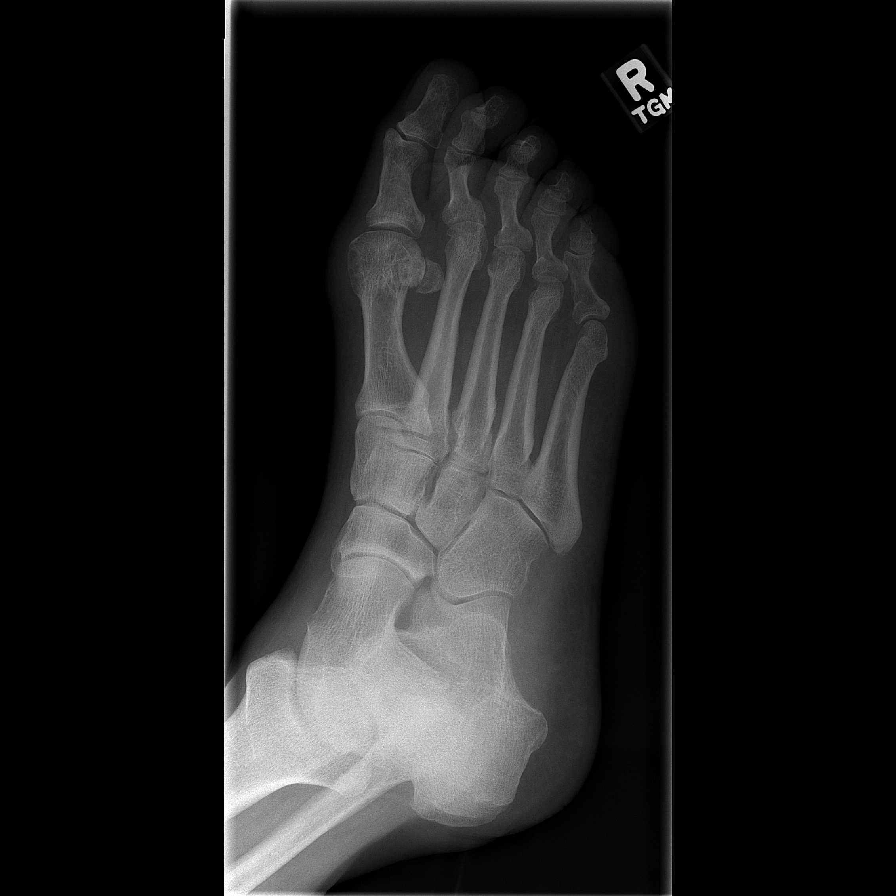

[t foot ap left]
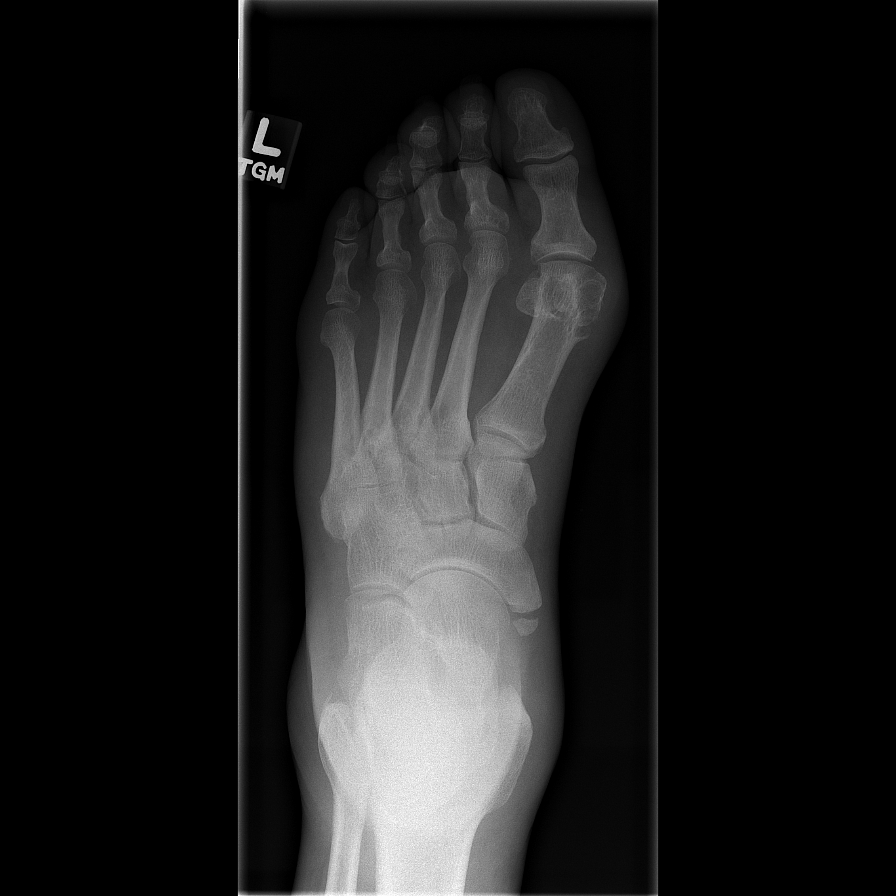

[t foot oblique left]
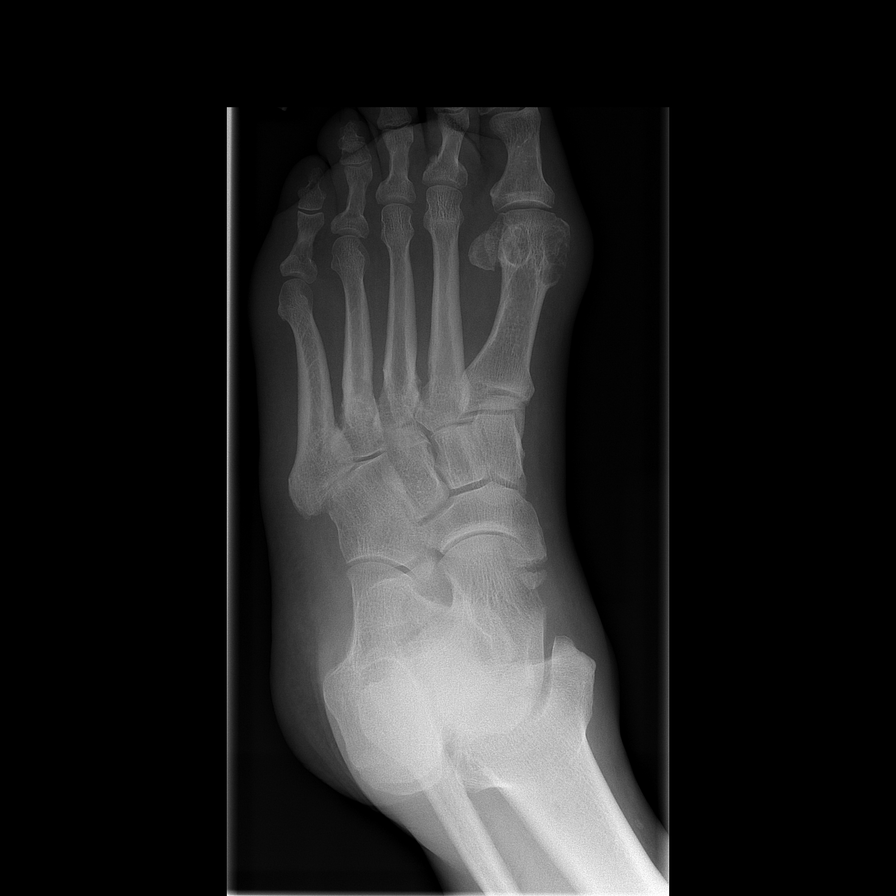

[t foot lat left]
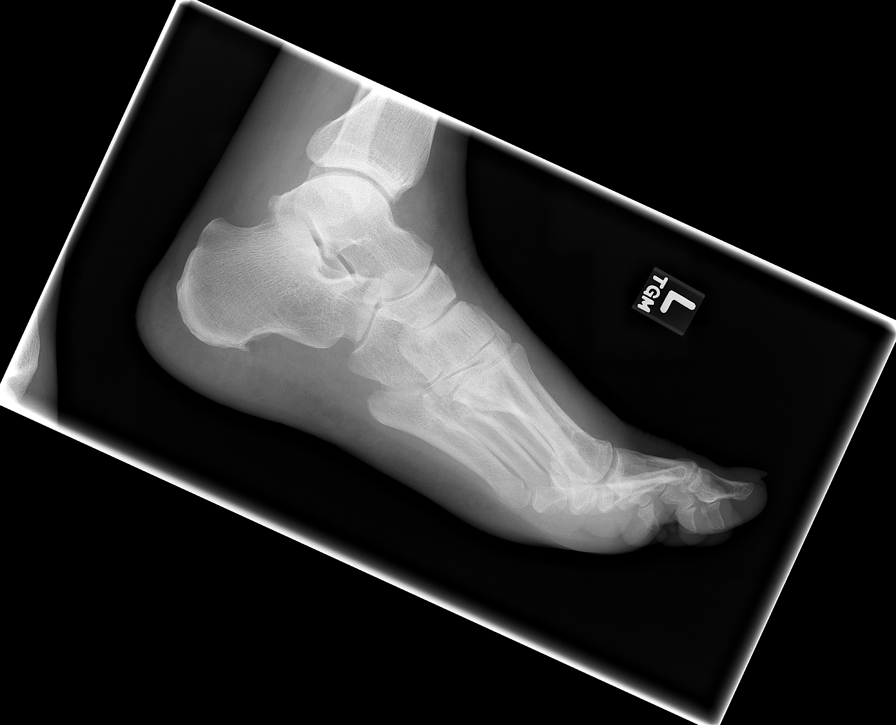

[t foot lat right]
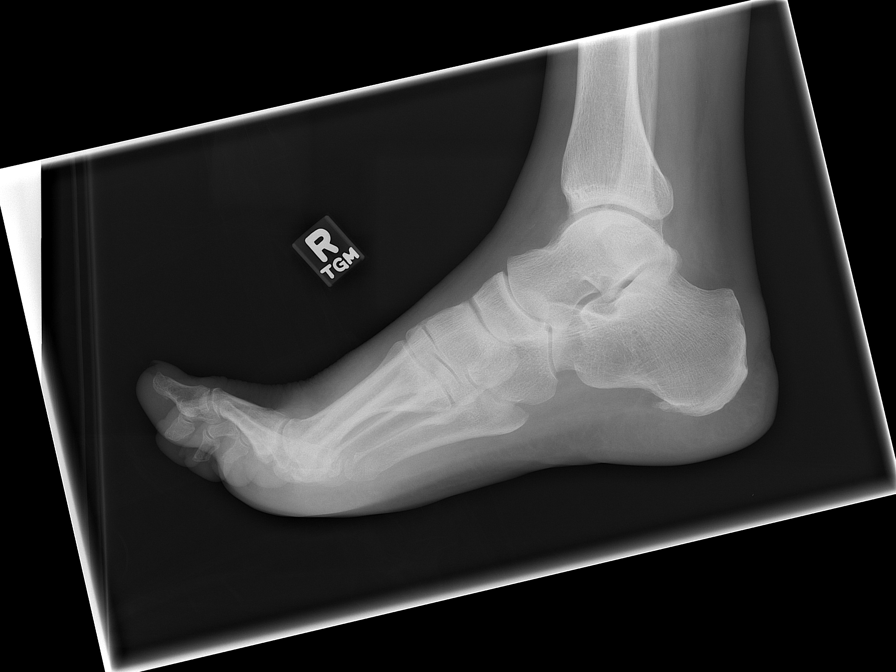

[6 of 6 positions shown; findings below may reference images not displayed]

IMPRESSION: Negative.
 RIGHT FOOT - 3 VIEW:
 There is no evidence of fracture or dislocation.  There is no evidence of arthropathy or other focal bone abnormality.  Soft tissues are unremarkable. Hallux valgus deformity noted.
IMPRESSION: Negative.

## 2007-11-30 ENCOUNTER — Ambulatory Visit: Payer: Self-pay | Admitting: Oncology

## 2007-12-02 ENCOUNTER — Encounter: Payer: Self-pay | Admitting: Internal Medicine

## 2007-12-02 LAB — COMPREHENSIVE METABOLIC PANEL
BUN: 15 mg/dL (ref 6–23)
CO2: 24 mEq/L (ref 19–32)
Calcium: 8.8 mg/dL (ref 8.4–10.5)
Creatinine, Ser: 0.94 mg/dL (ref 0.40–1.50)
Glucose, Bld: 162 mg/dL — ABNORMAL HIGH (ref 70–99)
Total Bilirubin: 0.5 mg/dL (ref 0.3–1.2)

## 2007-12-02 LAB — CBC WITH DIFFERENTIAL/PLATELET
BASO%: 0.6 % (ref 0.0–2.0)
Basophils Absolute: 0 10*3/uL (ref 0.0–0.1)
Eosinophils Absolute: 0.3 10*3/uL (ref 0.0–0.5)
HCT: 39.4 % (ref 38.7–49.9)
HGB: 13.7 g/dL (ref 13.0–17.1)
LYMPH%: 23.9 % (ref 14.0–48.0)
MCHC: 34.8 g/dL (ref 32.0–35.9)
MONO#: 0.5 10*3/uL (ref 0.1–0.9)
NEUT%: 58.6 % (ref 40.0–75.0)
Platelets: 190 10*3/uL (ref 145–400)
WBC: 4.9 10*3/uL (ref 4.0–10.0)

## 2007-12-04 ENCOUNTER — Encounter: Payer: Self-pay | Admitting: Internal Medicine

## 2007-12-04 ENCOUNTER — Telehealth: Payer: Self-pay | Admitting: Internal Medicine

## 2007-12-10 ENCOUNTER — Telehealth: Payer: Self-pay | Admitting: Internal Medicine

## 2008-01-11 ENCOUNTER — Ambulatory Visit: Payer: Self-pay | Admitting: Internal Medicine

## 2008-01-11 DIAGNOSIS — M25559 Pain in unspecified hip: Secondary | ICD-10-CM | POA: Insufficient documentation

## 2008-02-25 ENCOUNTER — Ambulatory Visit (HOSPITAL_COMMUNITY): Admission: RE | Admit: 2008-02-25 | Discharge: 2008-02-25 | Payer: Self-pay | Admitting: Oncology

## 2008-02-26 ENCOUNTER — Ambulatory Visit: Payer: Self-pay | Admitting: Oncology

## 2008-03-03 ENCOUNTER — Encounter: Payer: Self-pay | Admitting: Internal Medicine

## 2008-03-03 LAB — COMPREHENSIVE METABOLIC PANEL
Alkaline Phosphatase: 57 U/L (ref 39–117)
Glucose, Bld: 111 mg/dL — ABNORMAL HIGH (ref 70–99)
Total Bilirubin: 0.5 mg/dL (ref 0.3–1.2)
Total Protein: 6.5 g/dL (ref 6.0–8.3)

## 2008-03-03 LAB — CBC WITH DIFFERENTIAL/PLATELET
Basophils Absolute: 0.1 10*3/uL (ref 0.0–0.1)
EOS%: 4.2 % (ref 0.0–7.0)
HCT: 40.7 % (ref 38.7–49.9)
HGB: 13.9 g/dL (ref 13.0–17.1)
MCH: 31.3 pg (ref 28.0–33.4)
MONO#: 0.7 10*3/uL (ref 0.1–0.9)
NEUT#: 3.2 10*3/uL (ref 1.5–6.5)
NEUT%: 51.9 % (ref 40.0–75.0)
RDW: 13.6 % (ref 11.2–14.6)
WBC: 6.3 10*3/uL (ref 4.0–10.0)
lymph#: 2 10*3/uL (ref 0.9–3.3)

## 2008-03-25 ENCOUNTER — Telehealth: Payer: Self-pay | Admitting: Internal Medicine

## 2008-05-12 ENCOUNTER — Telehealth: Payer: Self-pay | Admitting: Internal Medicine

## 2008-06-06 ENCOUNTER — Telehealth: Payer: Self-pay | Admitting: Internal Medicine

## 2008-07-19 ENCOUNTER — Encounter: Payer: Self-pay | Admitting: Internal Medicine

## 2008-08-19 ENCOUNTER — Ambulatory Visit: Payer: Self-pay | Admitting: Oncology

## 2008-08-23 ENCOUNTER — Encounter: Payer: Self-pay | Admitting: Internal Medicine

## 2008-08-23 LAB — COMPREHENSIVE METABOLIC PANEL
ALT: 25 U/L (ref 0–53)
Alkaline Phosphatase: 55 U/L (ref 39–117)
CO2: 28 mEq/L (ref 19–32)
Creatinine, Ser: 0.98 mg/dL (ref 0.40–1.50)
Sodium: 139 mEq/L (ref 135–145)
Total Bilirubin: 0.4 mg/dL (ref 0.3–1.2)
Total Protein: 6.4 g/dL (ref 6.0–8.3)

## 2008-08-23 LAB — CBC WITH DIFFERENTIAL/PLATELET
BASO%: 0.4 % (ref 0.0–2.0)
EOS%: 3.7 % (ref 0.0–7.0)
HCT: 38.3 % — ABNORMAL LOW (ref 38.4–49.9)
LYMPH%: 30.8 % (ref 14.0–49.0)
MCH: 32.2 pg (ref 27.2–33.4)
MCHC: 34.5 g/dL (ref 32.0–36.0)
MCV: 93.3 fL (ref 79.3–98.0)
MONO#: 0.5 10*3/uL (ref 0.1–0.9)
MONO%: 9.2 % (ref 0.0–14.0)
NEUT%: 55.9 % (ref 39.0–75.0)
Platelets: 149 10*3/uL (ref 140–400)
RBC: 4.11 10*6/uL — ABNORMAL LOW (ref 4.20–5.82)
WBC: 5.7 10*3/uL (ref 4.0–10.3)

## 2008-08-23 LAB — LACTATE DEHYDROGENASE: LDH: 157 U/L (ref 94–250)

## 2008-10-03 ENCOUNTER — Telehealth: Payer: Self-pay | Admitting: Internal Medicine

## 2008-12-05 ENCOUNTER — Ambulatory Visit: Payer: Self-pay | Admitting: Internal Medicine

## 2008-12-08 ENCOUNTER — Ambulatory Visit: Payer: Self-pay | Admitting: Internal Medicine

## 2008-12-08 LAB — CONVERTED CEMR LAB
ALT: 48 units/L (ref 0–53)
AST: 51 units/L — ABNORMAL HIGH (ref 0–37)
Albumin: 4 g/dL (ref 3.5–5.2)
Calcium: 8.9 mg/dL (ref 8.4–10.5)
Cholesterol: 160 mg/dL (ref 0–200)
Creatinine, Ser: 1 mg/dL (ref 0.4–1.5)
GFR calc non Af Amer: 76.92 mL/min (ref >60)
HDL: 63.4 mg/dL (ref >39.00)
PSA: 1.65 ng/mL (ref 0.10–4.00)
Sodium: 140 meq/L (ref 135–145)
TSH: 0.85 microintl units/mL (ref 0.35–5.50)
Total CHOL/HDL Ratio: 3
Total Protein: 6.7 g/dL (ref 6.0–8.3)
Triglycerides: 146 mg/dL (ref 0.0–149.0)

## 2008-12-11 ENCOUNTER — Encounter: Payer: Self-pay | Admitting: Internal Medicine

## 2008-12-27 ENCOUNTER — Telehealth: Payer: Self-pay | Admitting: Internal Medicine

## 2009-03-03 ENCOUNTER — Ambulatory Visit: Payer: Self-pay | Admitting: Oncology

## 2009-03-07 ENCOUNTER — Ambulatory Visit (HOSPITAL_COMMUNITY): Admission: RE | Admit: 2009-03-07 | Discharge: 2009-03-07 | Payer: Self-pay | Admitting: Oncology

## 2009-03-07 LAB — COMPREHENSIVE METABOLIC PANEL
ALT: 30 U/L (ref 0–53)
Albumin: 4.1 g/dL (ref 3.5–5.2)
Alkaline Phosphatase: 53 U/L (ref 39–117)
Glucose, Bld: 123 mg/dL — ABNORMAL HIGH (ref 70–99)
Potassium: 4.3 mEq/L (ref 3.5–5.3)
Sodium: 139 mEq/L (ref 135–145)
Total Bilirubin: 0.5 mg/dL (ref 0.3–1.2)
Total Protein: 6.5 g/dL (ref 6.0–8.3)

## 2009-03-07 LAB — CBC WITH DIFFERENTIAL/PLATELET
BASO%: 0.7 % (ref 0.0–2.0)
Eosinophils Absolute: 0.2 10*3/uL (ref 0.0–0.5)
LYMPH%: 41.2 % (ref 14.0–49.0)
MCHC: 34.2 g/dL (ref 32.0–36.0)
MCV: 94.7 fL (ref 79.3–98.0)
MONO#: 0.6 10*3/uL (ref 0.1–0.9)
MONO%: 11.4 % (ref 0.0–14.0)
NEUT#: 2.3 10*3/uL (ref 1.5–6.5)
RBC: 4.26 10*6/uL (ref 4.20–5.82)
RDW: 13.9 % (ref 11.0–14.6)
WBC: 5.4 10*3/uL (ref 4.0–10.3)

## 2009-03-10 ENCOUNTER — Encounter: Payer: Self-pay | Admitting: Internal Medicine

## 2009-03-14 ENCOUNTER — Encounter: Payer: Self-pay | Admitting: Internal Medicine

## 2009-03-29 ENCOUNTER — Telehealth: Payer: Self-pay | Admitting: Internal Medicine

## 2009-07-05 ENCOUNTER — Telehealth: Payer: Self-pay | Admitting: Internal Medicine

## 2009-08-07 ENCOUNTER — Ambulatory Visit: Payer: Self-pay | Admitting: Internal Medicine

## 2009-09-01 ENCOUNTER — Ambulatory Visit: Payer: Self-pay | Admitting: Oncology

## 2009-09-05 ENCOUNTER — Encounter: Payer: Self-pay | Admitting: Internal Medicine

## 2009-09-05 LAB — CBC WITH DIFFERENTIAL/PLATELET
Basophils Absolute: 0 10*3/uL (ref 0.0–0.1)
Eosinophils Absolute: 0.3 10*3/uL (ref 0.0–0.5)
HCT: 41.2 % (ref 38.4–49.9)
HGB: 14 g/dL (ref 13.0–17.1)
LYMPH%: 30.5 % (ref 14.0–49.0)
MCH: 32.3 pg (ref 27.2–33.4)
MCV: 95 fL (ref 79.3–98.0)
MONO%: 11.1 % (ref 0.0–14.0)
NEUT#: 3.2 10*3/uL (ref 1.5–6.5)
NEUT%: 53.8 % (ref 39.0–75.0)
Platelets: 159 10*3/uL (ref 140–400)
RDW: 14 % (ref 11.0–14.6)

## 2009-09-05 LAB — COMPREHENSIVE METABOLIC PANEL
Albumin: 4.3 g/dL (ref 3.5–5.2)
Alkaline Phosphatase: 53 U/L (ref 39–117)
BUN: 16 mg/dL (ref 6–23)
Creatinine, Ser: 1.04 mg/dL (ref 0.40–1.50)
Glucose, Bld: 117 mg/dL — ABNORMAL HIGH (ref 70–99)
Potassium: 4.4 mEq/L (ref 3.5–5.3)

## 2009-11-02 ENCOUNTER — Encounter: Payer: Self-pay | Admitting: Internal Medicine

## 2009-11-02 ENCOUNTER — Ambulatory Visit: Payer: Self-pay | Admitting: Oncology

## 2009-11-02 LAB — CBC WITH DIFFERENTIAL/PLATELET
BASO%: 0.7 % (ref 0.0–2.0)
Basophils Absolute: 0 10*3/uL (ref 0.0–0.1)
EOS%: 2.6 % (ref 0.0–7.0)
HCT: 41.1 % (ref 38.4–49.9)
HGB: 13.6 g/dL (ref 13.0–17.1)
MCH: 31.2 pg (ref 27.2–33.4)
MCHC: 33.1 g/dL (ref 32.0–36.0)
MCV: 94.3 fL (ref 79.3–98.0)
MONO%: 8.4 % (ref 0.0–14.0)
NEUT%: 56.3 % (ref 39.0–75.0)
RDW: 13.4 % (ref 11.0–14.6)
lymph#: 1.9 10*3/uL (ref 0.9–3.3)

## 2009-11-02 LAB — COMPREHENSIVE METABOLIC PANEL
ALT: 39 U/L (ref 0–53)
AST: 43 U/L — ABNORMAL HIGH (ref 0–37)
Alkaline Phosphatase: 60 U/L (ref 39–117)
BUN: 21 mg/dL (ref 6–23)
Creatinine, Ser: 1.12 mg/dL (ref 0.40–1.50)
Total Bilirubin: 0.5 mg/dL (ref 0.3–1.2)

## 2009-11-09 ENCOUNTER — Ambulatory Visit (HOSPITAL_COMMUNITY): Admission: RE | Admit: 2009-11-09 | Discharge: 2009-11-09 | Payer: Self-pay | Admitting: Oncology

## 2009-11-10 ENCOUNTER — Encounter: Payer: Self-pay | Admitting: Internal Medicine

## 2009-11-16 ENCOUNTER — Telehealth: Payer: Self-pay | Admitting: Internal Medicine

## 2009-12-08 ENCOUNTER — Ambulatory Visit: Payer: Self-pay | Admitting: Internal Medicine

## 2009-12-08 ENCOUNTER — Encounter: Payer: Self-pay | Admitting: Internal Medicine

## 2009-12-08 LAB — CONVERTED CEMR LAB
ALT: 45 units/L (ref 0–53)
Alkaline Phosphatase: 57 units/L (ref 39–117)
Bilirubin, Direct: 0.2 mg/dL (ref 0.0–0.3)
Cholesterol: 194 mg/dL (ref 0–200)
Total Bilirubin: 0.6 mg/dL (ref 0.3–1.2)
Total Protein: 6.9 g/dL (ref 6.0–8.3)

## 2010-01-15 ENCOUNTER — Encounter: Payer: Self-pay | Admitting: Internal Medicine

## 2010-01-22 ENCOUNTER — Telehealth: Payer: Self-pay | Admitting: Internal Medicine

## 2010-01-22 DIAGNOSIS — R209 Unspecified disturbances of skin sensation: Secondary | ICD-10-CM | POA: Insufficient documentation

## 2010-02-09 ENCOUNTER — Telehealth: Payer: Self-pay | Admitting: Internal Medicine

## 2010-02-20 ENCOUNTER — Encounter: Payer: Self-pay | Admitting: Internal Medicine

## 2010-02-21 ENCOUNTER — Telehealth (INDEPENDENT_AMBULATORY_CARE_PROVIDER_SITE_OTHER): Payer: Self-pay | Admitting: *Deleted

## 2010-03-01 ENCOUNTER — Ambulatory Visit: Payer: Self-pay | Admitting: Oncology

## 2010-03-06 ENCOUNTER — Ambulatory Visit (HOSPITAL_COMMUNITY): Admission: RE | Admit: 2010-03-06 | Discharge: 2010-03-06 | Payer: Self-pay | Admitting: Oncology

## 2010-03-06 LAB — CBC WITH DIFFERENTIAL/PLATELET
BASO%: 0.6 % (ref 0.0–2.0)
EOS%: 3.6 % (ref 0.0–7.0)
MCH: 32.7 pg (ref 27.2–33.4)
MCHC: 34.5 g/dL (ref 32.0–36.0)
MONO#: 0.6 10*3/uL (ref 0.1–0.9)
RBC: 4.3 10*6/uL (ref 4.20–5.82)
RDW: 13.9 % (ref 11.0–14.6)
WBC: 5.4 10*3/uL (ref 4.0–10.3)
lymph#: 1.9 10*3/uL (ref 0.9–3.3)

## 2010-03-06 LAB — COMPREHENSIVE METABOLIC PANEL
ALT: 34 U/L (ref 0–53)
AST: 37 U/L (ref 0–37)
CO2: 23 mEq/L (ref 19–32)
Calcium: 9.3 mg/dL (ref 8.4–10.5)
Chloride: 102 mEq/L (ref 96–112)
Creatinine, Ser: 1 mg/dL (ref 0.40–1.50)
Sodium: 138 mEq/L (ref 135–145)
Total Protein: 6.6 g/dL (ref 6.0–8.3)

## 2010-03-06 LAB — LACTATE DEHYDROGENASE: LDH: 181 U/L (ref 94–250)

## 2010-03-08 ENCOUNTER — Encounter: Payer: Self-pay | Admitting: Internal Medicine

## 2010-03-30 ENCOUNTER — Encounter: Payer: Self-pay | Admitting: Internal Medicine

## 2010-04-13 ENCOUNTER — Encounter: Admission: RE | Admit: 2010-04-13 | Discharge: 2010-04-13 | Payer: Self-pay | Admitting: Diagnostic Neuroimaging

## 2010-04-23 ENCOUNTER — Encounter: Payer: Self-pay | Admitting: Internal Medicine

## 2010-05-22 ENCOUNTER — Encounter: Payer: Self-pay | Admitting: Internal Medicine

## 2010-05-24 ENCOUNTER — Telehealth: Payer: Self-pay | Admitting: Internal Medicine

## 2010-06-26 ENCOUNTER — Telehealth: Payer: Self-pay | Admitting: Internal Medicine

## 2010-07-15 ENCOUNTER — Encounter: Payer: Self-pay | Admitting: Oncology

## 2010-07-24 NOTE — Consult Note (Signed)
Summary: [facility]  [facility]   Imported By: Sherian Rein 04/03/2010 14:14:06  _____________________________________________________________________  External Attachment:    Type:   Image     Comment:   External Document

## 2010-07-24 NOTE — Progress Notes (Signed)
Summary: REFERRAL?  Phone Note Call from Patient Call back at [phone]   Caller: Patient Summary of Call: Patient lmovm wanting to discuss  left side face paresthesia, states this was discuss at last office visit (12/08/09) Initial call taken by: Rock Nephew CMA,  January 22, 2010 2:40 PM  Follow-up for Phone Call        Spoke w/pt. Pt c/o facial tingling/itching on the left side of his face. He said it has increased since last office visit and bothers him daily. He would like referral to dermatologist. Has seen Dr Mayford Knife in the past.   Follow-up by: Lamar Sprinkles, CMA,  January 22, 2010 3:00 PM  Additional Follow-up for Phone Call Additional follow up Details #1::        Note reviewed: paresthesia left face that was intermittent was documented along with a normal neuro and skin exam. Didn't see evidence of skin change.  OK for referral to Dr. Dorinda Hill [facility] notifide.  May need furhter neuro work up if not a skin problem: will start, if needed, with an MRI brain. Additional Follow-up by: Jacques Navy MD,  January 22, 2010 5:50 PM  New Problems: FACIAL PARESTHESIA, LEFT (ICD-782.0)   Additional Follow-up for Phone Call Additional follow up Details #2::    Informed pt.  Follow-up by: Ami Bullins CMA,  January 23, 2010 8:30 AM  New Problems: FACIAL PARESTHESIA, LEFT (ICD-782.0)

## 2010-07-24 NOTE — Consult Note (Signed)
Summary: Duard Larsen MD   Duard Larsen MD   Imported By: Sherian Rein 02/28/2010 15:37:49  _____________________________________________________________________  External Attachment:    Type:   Image     Comment:   External Document

## 2010-07-24 NOTE — Letter (Signed)
Summary: [facility]  [facility]   Imported By: Esmeralda Links D'jimraou 09/15/2007 11:02:21  _____________________________________________________________________  External Attachment:    Type:   Image     Comment:   External Document

## 2010-07-24 NOTE — Letter (Signed)
Summary: [facility]  [facility]   Imported By: Lester Arispe 03/21/2010 09:09:59  _____________________________________________________________________  External Attachment:    Type:   Image     Comment:   External Document

## 2010-07-24 NOTE — Letter (Signed)
Summary: [facility]  [facility]   Imported By: Sherian Rein 05/02/2010 10:39:19  _____________________________________________________________________  External Attachment:    Type:   Image     Comment:   External Document

## 2010-07-24 NOTE — Letter (Signed)
[facility]   Imported By: Sherian Rein 11/15/2009 11:48:47  _____________________________________________________________________  External Attachment:    Type:   Image     Comment:   External Document

## 2010-07-24 NOTE — Progress Notes (Signed)
Summary: Referral ?  Phone Note Call from Patient   Summary of Call: Pt c/o continued problems and wants neuro workup. Please advise.  Initial call taken by: Lamar Sprinkles, CMA,  February 09, 2010 1:42 PM  Follow-up for Phone Call        all records reviewed. Did he see dermatologist - Dr. Mayford Knife? 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 OK for referral to neurology-may take a couple of weeks. [facility] notified Follow-up by: Jacques Navy MD,  February 09, 2010 3:47 PM  Additional Follow-up for Phone Call Additional follow up Details #1::        left mess to call office back..............Marland KitchenLamar Sprinkles, CMA  February 09, 2010 4:38 PM     Additional Follow-up for Phone Call Additional follow up Details #2::    pt called and states he did see dermatologist and was given clobetasol and it is only helping with itching.  He still c/o facial numbness.  I advised him [facility] is waiting on Neuro referral and we will advise him of that appt info when we know. Follow-up by: Lanier Prude, [facility]),  February 12, 2010 11:07 AM

## 2010-07-24 NOTE — Assessment & Plan Note (Signed)
Summary: IBS IS BOTHERING HIM NOW/ MED REVIEW/ NWS #   Vital Signs:  Patient profile:   77 year old male Height:      69 inches Weight:      196 pounds BMI:     29.05 O2 Sat:      96 % on Room air Temp:     97.3 degrees F oral Pulse rate:   81 / minute BP sitting:   124 / 62  (left arm) Cuff size:   large  Vitals Entered By: [name] CMA (August 07, 2009 10:42 AM)  O2 Flow:  Room air CC: pt here to discuss reoccurance of IBS/ ab   Primary Care Provider:  [name]  CC:  pt here to discuss reoccurance of IBS/ ab.  History of Present Illness: Has had some problem with bowel habit: frequency in the AM, somewhat watery. No continued diarrhea. There has been no blood or mucus in the stool.  He is also getting leg cramps mostly first thing in th eAM when he gets up.  He is taking quinine as needed which works to prevent or reduce the cramps. (available otc in [place]).  He follows with [name] and has had no evidence of recurrence of his lymphoma. He reports that he is getting PET scan annually.  He continues to attend AA meetings. He does admt that he has had an increase in the amount of wine he drinks, citing many stressors, but he remains abstinent of spirits.  Current Medications (verified): 1)  Advair Diskus 250-50 Mcg/dose Misc (Fluticasone-Salmeterol) .... Inhale One Dose By Mouth Every 12 Hours 2)  Toprol Xl 50 Mg Xr24h-Tab (Metoprolol Succinate) .Marland Kitchen.. 1 & 1/2 Once Daily 3)  Nexium 40 Mg Cpdr (Esomeprazole Magnesium) .... Take 1 Tablet By Mouth Once A Day 4)  Hyomax-Sl 0.125 Mg  Subl (Hyoscyamine Sulfate) .Marland Kitchen.. 1 Q 2 Hrs As Needed Bloating, Gas & Cramps 5)  Simvastatin 20 Mg  Tabs (Simvastatin) .... Take 1 Tablet By Mouth Once A Day 6)  Potassium Chloride 20 Meq  Pack (Potassium Chloride) .... Take 1 Tablet By Mouth Once A Day 7)  Adult Aspirin Low Strength 81 Mg  Tbdp (Aspirin) .... Take 1 Tablet By Mouth Once A Day 8)  Viagra 100 Mg Tabs (Sildenafil Citrate) .... 1/2   or 1 As Needed 9)  Proair Hfa 108 (90 Base) Mcg/act  Aers (Albuterol Sulfate) .... As Needed 10)  Gabapentin 300 Mg  Caps (Gabapentin) .Marland Kitchen.. 1 At Bedtime 11)  Celebrex 100 Mg  Caps (Celecoxib) .Marland Kitchen.. 1 By Mouth Two Times A Day. 12)  Temazepam 15 Mg  Caps (Temazepam) .Marland Kitchen.. 1 At Bedtime 13)  Magnesium Oxide 400 Mg  Caps (Magnesium Oxide) .Marland Kitchen.. 1 By Mouth Once Daily 14)  Diphenoxylate-Atropine 2.5-0.025 Mg Tabs (Diphenoxylate-Atropine) .... Take One Tab After Each Loose Stool Max 4 A Day 15)  Cialis  Allergies (verified): 1)  ! 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 fib) Prostatitis - chronic bacterial pseudogout chronic sinus problems cellulitis - 2nd to T. Pedis hearing loss   Physician roster:                  GI-Dr.  Jarold Motto                  Card -  Dr. Antoine Poche                  Onc - [name]                  GU - Dr. Ermelinda Das - Dr. Delford Field                  Psych - Dr. Dellia Cloud  Past Surgical History: Last updated: 11/12/07 EGD (01/27/2002) Inguinal herniorrhaphy '79 torn biceps repair  Family History: Last updated: 2007-11-12 father- deceased @ 55 - CAD/MI-fatal, mother - deceased @ 44 - natural causes brother - deceased colon cancer aunt - TB  Social History: Last updated: 11/12/07 Jack [name] married '56- widowed '07 1 son, 2 daughters, 4 grandchildren work: former Counsellor; very active in civic affairs lives alone: very busy renovating family home in [place]09) Active in AA  Risk Factors: Alcohol Use: <1 (12/05/2008) Caffeine Use: 1 cup of coffee - decaf (12/05/2008) Diet: heart healthy (12/05/2008) Exercise: yes  (12/05/2008)  Risk Factors: Smoking Status: quit (12/05/2008)  Review of Systems       The patient complains of decreased hearing.  The patient denies anorexia, fever, weight loss, vision loss, chest pain, dyspnea on exertion, peripheral edema, prolonged cough, headaches, abdominal pain, severe indigestion/heartburn, muscle weakness, difficulty walking, enlarged lymph nodes, and angioedema.    Physical Exam  General:  WNWD white male in no distress Head:  Normocephalic and atraumatic without obvious abnormalities. No apparent alopecia or balding. Eyes:  pupils equal, pupils round, and corneas and lenses clear.   Lungs:  normal respiratory effort and normal breath sounds.   Heart:  normal rate and regular rhythm.   Msk:  no joint tenderness and no joint swelling.   Pulses:  2+ radial Neurologic:  alert & oriented X3, cranial nerves II-XII intact, and gait normal.   Skin:  turgor normal and color normal.   Psych:  Oriented X3, memory intact for recent and remote, normally interactive, and good eye contact.     Impression & Recommendations:  Problem # 1:  IRRITABLE BOWEL SYNDROME (ICD-564.1) His diarrehea seems more like irregular bowel habit and not true medical diarrhea.  Plan - use of a bulk laxative daily  Problem # 2:  LEG CRAMPS (ICD-729.82) A chronic problem that he is managing with the use of quinine that he obtains otc in [place]. I did advise him that there is a known, but low risk of cardiac arrhythmia associated with quinine which is why it is off the market in the [place].   Plan - to continue with quinine 1 tablet ( unspecified mg on the label) at bedtime.  Problem # 3:  LYMPHOMA (ICD-202.80) Stable remission  Problem # 4:  OSTEOARTHRITIS (ICD-715.90) Reviewed the [place] of celebrex. He is having no GI affects and gets good relief with regular use.  Plan - cointinue present regimen  His updated medication list for this problem includes:    Adult Aspirin Low Strength 81 Mg  Tbdp (Aspirin) .Marland Kitchen... Take 1 tablet by mouth once a day    Celebrex 100 Mg Caps (Celecoxib) .Marland Kitchen... 1 by mouth two times a day.  Problem # 5:  travel He is headed for [place] in March for 3 weeks. Provided Rx for cipro to use of traveler's diarrhea or infection. He is cautioned to avoid ice in his beverages. All  Rx's are brought up to date.  Complete Medication List: 1)  Advair Diskus 250-50 Mcg/dose Misc (Fluticasone-salmeterol) .... Inhale one dose by mouth every 12 hours 2)  Toprol Xl 50 Mg Xr24h-tab (Metoprolol succinate) .Marland Kitchen.. 1 & 1/2 once daily 3)  Nexium 40 Mg Cpdr (Esomeprazole magnesium) .... Take 1 tablet by mouth once a day 4)  Hyomax-sl 0.125 Mg Subl (Hyoscyamine sulfate) .Marland Kitchen.. 1 q 2 hrs as needed bloating, gas & cramps 5)  Simvastatin 20 Mg Tabs (Simvastatin) .... Take 1 tablet by mouth once a day 6)  Potassium Chloride 20 Meq Pack (Potassium chloride) .... Take 1 tablet by mouth once a day 7)  Adult Aspirin Low Strength 81 Mg Tbdp (Aspirin) .... Take 1 tablet by mouth once a day 8)  Viagra 100 Mg Tabs (Sildenafil citrate) .... 1/2  or 1 as needed 9)  Proair Hfa 108 (90 Base) Mcg/act Aers (Albuterol sulfate) .... As needed 10)  Celebrex 100 Mg Caps (Celecoxib) .Marland Kitchen.. 1 by mouth two times a day. 11)  Temazepam 15 Mg Caps (Temazepam) .Marland Kitchen.. 1 at bedtime 12)  Magnesium Oxide 400 Mg Caps (Magnesium oxide) .Marland Kitchen.. 1 by mouth once daily 13)  Diphenoxylate-atropine 2.5-0.025 Mg Tabs (Diphenoxylate-atropine) .... Take one tab after each loose stool max 4 a day 14)  Quinine Hcl Dihydrate Crys (Quinine hcl dihydrate) .Marland Kitchen.. 1 by mouth at bedtime for leg cramps 15)  Ciprofloxacin Hcl 500 Mg Tabs (Ciprofloxacin hcl) .Marland Kitchen.. 1 by mouth two times a day x 5 days for diarrhea abroad, x 7 days for respiratory infection Prescriptions: TEMAZEPAM 15 MG  CAPS (TEMAZEPAM) 1 at bedtime  #30 x 5   Entered and Authorized by:   Jacques Navy MD   Signed by:   Jacques Navy MD on 08/07/2009   Method used:    Handwritten   RxID:   [id number] CIPROFLOXACIN HCL 500 MG TABS (CIPROFLOXACIN HCL) 1 by mouth two times a day x 5 days for diarrhea abroad, x 7 days for respiratory infection  #21 x 1   Entered and Authorized by:   Jacques Navy MD   Signed by:   Jacques Navy MD on 08/07/2009   Method used:   Electronically to        [facility]. [phone]* (retail)       [phone] [address]       [place], [place]  [address]       Ph: [phone]       Fax: [phone]   RxID:   [id number] VIAGRA 100 MG TABS (SILDENAFIL CITRATE) 1/2  or 1 as needed  #6 Tablet x 12   Entered and Authorized by:   Jacques Navy MD   Signed by:   Jacques Navy MD on 08/07/2009   Method used:   Electronically to        [facility]. [phone]* (retail)       [phone] [address].       [place], [place]  [address]       Ph: [phone]       Fax: [phone]   RxID:   [id number] TOPROL XL 50 MG XR24H-TAB (METOPROLOL SUCCINATE) 1 & 1/2 once daily  #45 x 12   Entered and Authorized by:   Jacques Navy MD   Signed by:   Jacques Navy MD on 08/07/2009   Method used:  Electronically to        [facility]. [phone]* (retail)       [phone] [address].       [place], [place]  [address]       Ph: [phone]       Fax: [phone]   RxID:   [id number] ADVAIR DISKUS 250-50 MCG/DOSE MISC (FLUTICASONE-SALMETEROL) Inhale one dose by mouth every 12 hours  #1 inhaler x 12   Entered and Authorized by:   Jacques Navy MD   Signed by:   Jacques Navy MD on 08/07/2009   Method used:   Electronically to        [facility]. [phone]* (retail)       [phone] [address]       [place], [place]  [address]       Ph: [phone]       Fax: [phone]   RxID:   [id number] HYOMAX-SL 0.125 MG  SUBL (HYOSCYAMINE SULFATE) 1 q 2 hrs as needed bloating, gas & cramps  #30 x  3   Entered and Authorized by:   Jacques Navy MD   Signed by:   Jacques Navy MD on 08/07/2009   Method used:   Electronically to        [facility]. [phone]* (retail)       [phone] [address].       [place], [place]  [address]       Ph: [phone]       Fax: [phone]   RxID:   [id number] NEXIUM 40 MG CPDR (ESOMEPRAZOLE MAGNESIUM) Take 1 tablet by mouth once a day  #30 Capsule x 12   Entered and Authorized by:   Jacques Navy MD   Signed by:   Jacques Navy MD on 08/07/2009   Method used:   Electronically to        [facility]. [phone]* (retail)       [phone] [address]       [place], [place]  [address]       Ph: [phone]       Fax: [phone]   RxID:   [id number]

## 2010-07-24 NOTE — Progress Notes (Signed)
Summary: Referral status  Phone Note Call from Patient Call back at Home Phone [phone] Call back at [phone]   Summary of Call: Patient called to check on status of referral.  Initial call taken by: Lucious Groves CMA,  February 21, 2010 4:19 PM  Follow-up for Phone Call        Appt scheduled for 03-30-2010@8 :30 Dr Marjory Lies - pt informformed spoke with pt Jack [name]  February 22, 2010 12:30 PM'

## 2010-07-24 NOTE — Progress Notes (Signed)
  Phone Note Refill Request Message from:  Fax from Pharmacy on May 24, 2010 4:52 PM  Refills Requested: Medication #1:  CIPROFLOXACIN HCL 500 MG TABS 1 by mouth two times a day x 5 days for diarrhea abroad Initial call taken by: Ami Bullins CMA,  May 24, 2010 4:52 PM    Prescriptions: CIPROFLOXACIN HCL 500 MG TABS (CIPROFLOXACIN HCL) 1 by mouth two times a day x 5 days for diarrhea abroad, x 7 days for respiratory infection  #21 x 0   Entered by:   Ami Bullins CMA   Authorized by:   Jacques Navy MD   Signed by:   [name] CMA on 05/24/2010   Method used:   Electronically to        [facility]. [phone]* (retail)       [phone] [address]       [place], [place]  [address]       Ph: [phone]       Fax: [phone]   RxID:   [id number]

## 2010-07-24 NOTE — Letter (Signed)
Summary: [facility]  [facility]   Imported By: Sherian Rein 05/25/2010 10:43:43  _____________________________________________________________________  External Attachment:    Type:   Image     Comment:   External Document

## 2010-07-24 NOTE — Progress Notes (Signed)
  Phone Note Refill Request Message from:  Fax from Pharmacy on July 05, 2009 2:03 PM  Refills Requested: Medication #1:  DIPHENOXYLATE-ATROPINE 2.5-0.025 MG TABS Take one tab after each loose stool max 4 a day recieved refill request from [facility] [address] [phone]  Initial call taken by: Ami Bullins CMA,  July 05, 2009 2:04 PM  Follow-up for Phone Call        ok for refill x 2 Follow-up by: Jacques Navy MD,  July 05, 2009 4:03 PM    Prescriptions: DIPHENOXYLATE-ATROPINE 2.5-0.025 MG TABS (DIPHENOXYLATE-ATROPINE) Take one tab after each loose stool max 4 a day  #20 x 2   Entered by:   Ami Bullins CMA   Authorized by:   Jacques Navy MD   Signed by:   [name] CMA on 07/05/2009   Method used:   Telephoned to ...       [facility]. [phone]* (retail)       [phone] [address]       [place], [place]  [address]       Ph: [phone]       Fax: [phone]   RxID:   [id number]

## 2010-07-24 NOTE — Letter (Signed)
[facility]   Imported By: Lester Grandview 11/22/2009 11:40:57  _____________________________________________________________________  External Attachment:    Type:   Image     Comment:   External Document

## 2010-07-24 NOTE — Progress Notes (Signed)
  Phone Note Refill Request Message from:  Fax from Pharmacy on Nov 16, 2009 9:41 AM  Refills Requested: Medication #1:  DIPHENOXYLATE-ATROPINE 2.5-0.025 MG TABS Take one tab after each loose stool max 4 a day Initial call taken by: Ami Bullins CMA,  Nov 16, 2009 9:42 AM  Follow-up for Phone Call        Please Advise refill for pt Follow-up by: Ami Bullins CMA,  Nov 16, 2009 9:43 AM  Additional Follow-up for Phone Call Additional follow up Details #1::        ok to refill: 1 by mouth after loose stool. Limit - 4 doses/24hrs, # 30 2 refills Additional Follow-up by: Jacques Navy MD,  Nov 16, 2009 1:10 PM    Prescriptions: DIPHENOXYLATE-ATROPINE 2.5-0.025 MG TABS (DIPHENOXYLATE-ATROPINE) Take one tab after each loose stool max 4 a day  #30 x 2   Entered by:   Ami Bullins CMA   Authorized by:   Jacques Navy MD   Signed by:   [name] CMA on 11/16/2009   Method used:   Telephoned to ...       [facility]. [phone]* (retail)       [phone] [address]       [place], [place]  [address]       Ph: [phone]       Fax: [phone]   RxID:   [id number]

## 2010-07-24 NOTE — Assessment & Plan Note (Signed)
Summary: YEARLY FU/ LABS AFTER/ MEDICARE/[facility]   Vital Signs:  Patient profile:   77 year old male Height:      69 inches Weight:      194 pounds BMI:     28.75 O2 Sat:      95 % on Room air Temp:     98.3 degrees F oral Pulse rate:   76 / minute BP sitting:   128 / 80  (left arm) Cuff size:   regular  Vitals Entered By: [name] CMA (December 08, 2009 10:37 AM)  O2 Flow:  Room air  Primary Care Provider:  [name]   History of Present Illness: Patient had a recent scare with fullness in the neck-May 12th. He had a full evaluation by [name] with full labs that were normal except for mildly elevated transaminase. He had normal CT/PET with no hypermetabolic lesions. The symptoms did resolve.  Today he reports that he has intermittent paresthesia at the left face which can last up to an hour. He has no pain, no slurred speech, no drooling, no droop of the face, no visual changes, no cognitive changes, no tenderness at the TMJ or ear pain.   He is otherwise feeling well. Remains active: renovating a carriage house, building ATV trails, traveling. Taking the family (68) to [place]. Planning a trip to [place] in August.  Current Medications (verified): 1)  Advair Diskus 250-50 Mcg/dose Misc (Fluticasone-Salmeterol) .... Inhale One Dose By Mouth Every 12 Hours 2)  Toprol Xl 50 Mg Xr24h-Tab (Metoprolol Succinate) .Marland Kitchen.. 1 & 1/2 Once Daily 3)  Nexium 40 Mg Cpdr (Esomeprazole Magnesium) .... Take 1 Tablet By Mouth Once A Day 4)  Hyomax-Sl 0.125 Mg  Subl (Hyoscyamine Sulfate) .Marland Kitchen.. 1 Q 2 Hrs As Needed Bloating, Gas & Cramps 5)  Simvastatin 20 Mg  Tabs (Simvastatin) .... Take 1 Tablet By Mouth Once A Day 6)  Potassium Chloride 20 Meq  Pack (Potassium Chloride) .... Take 1 Tablet By Mouth Once A Day 7)  Adult Aspirin Low Strength 81 Mg  Tbdp (Aspirin) .... Take 1 Tablet By Mouth Once A Day 8)  Viagra 100 Mg Tabs (Sildenafil Citrate) .... 1/2  or 1 As Needed 9)  Proair Hfa 108 (90 Base) Mcg/act   Aers (Albuterol Sulfate) .... As Needed 10)  Celebrex 100 Mg  Caps (Celecoxib) .Marland Kitchen.. 1 By Mouth Two Times A Day. 11)  Temazepam 15 Mg  Caps (Temazepam) .Marland Kitchen.. 1 At Bedtime 12)  Magnesium Oxide 400 Mg  Caps (Magnesium Oxide) .Marland Kitchen.. 1 By Mouth Once Daily 13)  Diphenoxylate-Atropine 2.5-0.025 Mg Tabs (Diphenoxylate-Atropine) .... Take One Tab After Each Loose Stool Max 4 A Day 14)  Quinine Hcl Dihydrate  Crys (Quinine Hcl Dihydrate) .Marland Kitchen.. 1 By Mouth At Bedtime For Leg Cramps 15)  Ciprofloxacin Hcl 500 Mg Tabs (Ciprofloxacin Hcl) .Marland Kitchen.. 1 By Mouth Two Times A Day X 5 Days For Diarrhea Abroad, X 7 Days For Respiratory Infection  Allergies (verified): 1)  ! 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 fib) Prostatitis - chronic bacterial pseudogout chronic sinus problems cellulitis - 2nd to T. Pedis hearing loss   Physician roster:                  GI-Dr.  Jarold Motto  Card - Dr. Antoine Poche                  Onc - [name]                  GU - Dr. Ermelinda Das - Dr. Delford Field                  Psych - Dr. Dellia Cloud  Past Surgical History: Last updated: 11/12/07 EGD (01/27/2002) Inguinal herniorrhaphy '79 torn biceps repair  Family History: Last updated: 11/12/07 father- deceased @ 36 - CAD/MI-fatal, mother - deceased @ 67 - natural causes brother - deceased colon cancer aunt - TB  Social History: Last updated: 11-12-2007 [place] married '56- widowed '07 1 son, 2 daughters, 4 grandchildren work: former Counsellor; very active in civic affairs lives alone: very busy renovating [place] home in [place] ("09) Active in AA  Risk Factors: Alcohol Use: <1  (12/05/2008) Caffeine Use: 1 cup of coffee - decaf (12/05/2008) Diet: heart healthy (12/05/2008) Exercise: yes (12/05/2008)  Risk Factors: Smoking Status: quit (12/05/2008)  Review of Systems  The patient denies anorexia, fever, weight loss, weight gain, vision loss, decreased hearing, hoarseness, chest pain, syncope, dyspnea on exertion, peripheral edema, prolonged cough, hemoptysis, abdominal pain, severe indigestion/heartburn, incontinence, genital sores, suspicious skin lesions, difficulty walking, depression, enlarged lymph nodes, and angioedema.    Physical Exam  General:  WNWD white male in no distress. Head:  normocephalic, atraumatic, and no abnormalities observed. Normal facies. No tenderness over the TMJ left.   Eyes:  pupils equal, pupils round, corneas and lenses clear, and no injection.   Ears:  no external deformities.   Nose:  no external deformity and no external erythema.   Mouth:  Oral mucosa and oropharynx without lesions or exudates.  Teeth in good repair. Neck:  supple, full ROM, no masses, no thyromegaly, and no carotid bruits.   Chest Wall:  no deformities.   Lungs:  Normal respiratory effort, chest expands symmetrically. Lungs are clear to auscultation, no crackles or wheezes. Heart:  Normal rate and regular rhythm. S1 and S2 normal without gallop, murmur, click, rub or other extra sounds. Abdomen:  soft, non-tender, normal bowel sounds, no guarding, no rigidity, no abdominal hernia, and no hepatomegaly.   Rectal:  No external abnormalities noted. Normal sphincter tone. No rectal masses or tenderness. Prostate:  Prostate gland firm and smooth, no enlargement, nodularity, tenderness, mass, asymmetry or induration. Msk:  normal ROM, no joint tenderness, no joint swelling, and no redness over joints.   Pulses:  2+ radial, DP and PT pulses Extremities:  No clubbing, cyanosis, edema, or deformity noted with normal full range of motion of all joints.   Neurologic:   alert & oriented X3, cranial nerves II-XII intact, strength normal in all extremities, sensation intact to light touch, sensation intact to pinprick, gait normal, and DTRs symmetrical and normal.   Skin:  turgor normal.  Very tanned. No suspicious lesions, no ulcerations Cervical Nodes:  no anterior cervical adenopathy and no posterior cervical adenopathy.   Psych:  Oriented X3, memory intact for recent and remote, normally interactive, good eye contact, and not anxious appearing.     Impression & Recommendations:  Problem # 1:  IRRITABLE BOWEL SYNDROME (ICD-564.1) Stable without complaint of bloating or irregular bowel sounds  Problem # 2:  ALCOHOLIC CIRRHOSIS OF LIVER (ICD-571.2) Patient remains  abstemious of liquor but admits to drinking wine. He has been irregular in his attendance to AA. He is not currently a sponsor. He has no abdominal pain, no jaundice. LFTs with minimal elevation AST. No palpable liver edge on exam.  Plan - exercise restraint and moderation  in use of wine.  Problem # 3:  LEG CRAMPS (ICD-729.82) Still a recurring problem. He has been able to buy an OTC quinine product. He uses a TENS unit to good effect. He continues to use gabapentin. This combination keeps him relatively comfortable.  Problem # 4:  HYPERLIPIDEMIA (ICD-272.4) For routine lab follow-up  His updated medication list for this problem includes:    Simvastatin 20 Mg Tabs (Simvastatin) .Marland Kitchen... Take 1 tablet by mouth once a day  Orders: Prescription Created Electronically [phone]) TLB-Lipid Panel (80061-LIPID) TLB-Hepatic/Liver Function Pnl (80076-HEPATIC)  Addendum - LDL 89 - good control on low dose statin therapy  Problem # 5:  LYMPHOMA (ICD-202.80) Follow routinely with [name]. He continues to be in remission.  Problem # 6:  EMPHYSEMA (ICD-492.8) Continues to use advair. PFTs done in office today with confirmation of his COPD. He seems stable. Will repeat PFTs in 1 year.   Problem # 7:   OSTEOARTHRITIS (ICD-715.90)  Continues with NSAID therapy. Lab at [facility] revealed normal renal function. He has not dypsepsia. He continues to exercise and work with a Systems analyst which now includes Yoga once a week.  Plan - continue present medications            continue or increase YOGA for preservation of mobility  His updated medication list for this problem includes:    Adult Aspirin Low Strength 81 Mg Tbdp (Aspirin) .Marland Kitchen... Take 1 tablet by mouth once a day    Celebrex 100 Mg Caps (Celecoxib) .Marland Kitchen... 1 by mouth two times a day.  Orders: Prescription Created Electronically [phone])  Problem # 8:  Preventive Health Care (ICD-V70.0) History is positive. Exam is normal. Lab from [facility] reviewed - minimal elevatin of LFT. Prostate exam and PSA normal. Patient is very active with no signs or symptoms of depression. He has no increased fall risk and has normal mobility. He is totally independent in his ADLs. Last colonoscopy in '06 witout polyps or abnormality except diverticulosis. Tetnus and pneumovax in '10.  In summary - a very nice man who is medically stable and doing well. He will return in 1 year or as needed.   Complete Medication List: 1)  Advair Diskus 250-50 Mcg/dose Misc (Fluticasone-salmeterol) .... Inhale one dose by mouth every 12 hours 2)  Toprol Xl 50 Mg Xr24h-tab (Metoprolol succinate) .Marland Kitchen.. 1 & 1/2 once daily 3)  Nexium 40 Mg Cpdr (Esomeprazole magnesium) .... Take 1 tablet by mouth once a day 4)  Hyomax-sl 0.125 Mg Subl (Hyoscyamine sulfate) .Marland Kitchen.. 1 q 2 hrs as needed bloating, gas & cramps 5)  Simvastatin 20 Mg Tabs (Simvastatin) .... Take 1 tablet by mouth once a day 6)  Potassium Chloride 20 Meq Pack (Potassium chloride) .... Take 1 tablet by mouth once a day 7)  Adult Aspirin Low Strength 81 Mg Tbdp (Aspirin) .... Take 1 tablet by mouth once a day 8)  Viagra 100 Mg Tabs (Sildenafil citrate) .... 1/2  or 1 as needed 9)  Proair Hfa 108 (90 Base) Mcg/act Aers (Albuterol  sulfate) .... As needed 10)  Celebrex 100 Mg Caps (Celecoxib) .Marland Kitchen.. 1 by mouth two times a day. 11)  Temazepam 30 Mg Caps (Temazepam) .Marland Kitchen.. 1 by  mouth at bedtime 12)  Magnesium Oxide 400 Mg Caps (Magnesium oxide) .Marland Kitchen.. 1 by mouth once daily 13)  Diphenoxylate-atropine 2.5-0.025 Mg Tabs (Diphenoxylate-atropine) .... Take one tab after each loose stool max 4 a day 14)  Quinine Hcl Dihydrate Crys (Quinine hcl dihydrate) .Marland Kitchen.. 1 by mouth at bedtime for leg cramps 15)  Ciprofloxacin Hcl 500 Mg Tabs (Ciprofloxacin hcl) .Marland Kitchen.. 1 by mouth two times a day x 5 days for diarrhea abroad, x 7 days for respiratory infection 16)  Gabapentin 300 Mg Caps (Gabapentin) .Marland Kitchen.. 1 by mouth at bedtime  Other Orders: TLB-PSA (Prostate Specific Antigen) (84153-PSA) Subsequent annual wellness visit with prevention plan (Y7829)   : Jack [name] Note: All result statuses are Final unless otherwise noted.  Tests: (1) Lipid Panel (LIPID)   Cholesterol               194 mg/dL                   5-621     ATP III Classification            Desirable:  < 200 mg/dL                    Borderline High:  200 - 239 mg/dL               High:  > = 240 mg/dL   Triglycerides        [H]  177.0 mg/dL                 3.0-865.7     Normal:  <150 mg/dL     Borderline High:  846 - 199 mg/dL   HDL                       96.29 mg/dL                 >52.84   VLDL Cholesterol          35.4 mg/dL                  1.3-24.4   LDL Cholesterol           89 mg/dL                    0-10  CHO/HDL Ratio:  CHD Risk                             3                    Men          Women     1/2 Average Risk     3.4          3.3     Average Risk          5.0          4.4     2X Average Risk          9.6          7.1     3X Average Risk          15.0          11.0                           Tests: (2) Hepatic/Liver Function  Panel (HEPATIC)   Total Bilirubin           0.6 mg/dL                   1.6-1.0   Direct Bilirubin          0.2 mg/dL                    9.6-0.4   Alkaline Phosphatase      57 U/L                      39-117   AST                  [H]  45 U/L                      0-37   ALT                       45 U/L                      0-53   Total Protein             6.9 g/dL                    5.4-0.9   Albumin                   4.6 g/dL                    8.1-1.9  Tests: (3) Prostate Specific Antigen (PSA)   PSA-Hyb                   1.88 ng/mL                  0.10-4.00Prescriptions: GABAPENTIN 300 MG CAPS (GABAPENTIN) 1 by mouth at bedtime  #30 x 12   Entered and Authorized by:   Jacques Navy MD   Signed by:   Jacques Navy MD on 12/08/2009   Method used:   Electronically to        [facility]. [phone]* (retail)       [phone]       [place], [place]  [address]       Ph: [phone]       Fax: [phone]   RxID:   [id number] GABAPENTIN 300 MG CAPS (GABAPENTIN) 1 by mouth at bedtime  #30 x 12   Entered and Authorized by:   Jacques Navy MD   Signed by:   Jacques Navy MD on 12/08/2009   Method used:   Handwritten   RxID:   [id number] TEMAZEPAM 30 MG CAPS (TEMAZEPAM) 1 by mouth at bedtime  #30 x 5   Entered and Authorized by:   Jacques Navy MD   Signed by:   Jacques Navy MD on 12/08/2009   Method used:   Handwritten   RxID:   [id number] CELEBREX 100 MG  CAPS (CELECOXIB) 1 by mouth two times a day.  #60 x 12   Entered and Authorized by:   Jacques Navy MD   Signed by:   Jacques Navy MD on 12/08/2009   Method used:   Electronically to        [facility] [phone]* (retail)       [phone]       [place], [place]  [address]       Ph: [phone]       Fax: [phone]   RxID:   [id number] SIMVASTATIN 20 MG  TABS (SIMVASTATIN) Take 1 tablet by mouth once a day  #30 Tablet x 4   Entered and Authorized by:   Jacques Navy MD   Signed by:   Jacques Navy MD on 12/08/2009   Method used:    Electronically to        [facility]. [phone]* (retail)       [phone] [address]       [place], [place]  [address]       Ph: [phone]       Fax: [phone]   RxID:   [id number] CIPROFLOXACIN HCL 500 MG TABS (CIPROFLOXACIN HCL) 1 by mouth two times a day x 5 days for diarrhea abroad, x 7 days for respiratory infection  #21 x 0   Entered and Authorized by:   Jacques Navy MD   Signed by:   Jacques Navy MD on 12/08/2009   Method used:   Electronically to        [facility]. [phone]* (retail)       [phone]       [place], [place]  [address]       Ph: [phone]       Fax: [phone]   RxID:   [id number]

## 2010-07-24 NOTE — Letter (Signed)
[facility]   Imported By: Sherian Rein 09/22/2009 14:46:33  _____________________________________________________________________  External Attachment:    Type:   Image     Comment:   External Document

## 2010-07-24 NOTE — Therapy (Signed)
Summary: [facility]  [facility]   Imported By: Sherian Rein 02/28/2010 15:36:08  _____________________________________________________________________  External Attachment:    Type:   Image     Comment:   External Document

## 2010-07-26 NOTE — Progress Notes (Signed)
Summary: [facility]  Phone Note Refill Request Message from:  Pharmacy  Refills Requested: Medication #1:  TEMAZEPAM 30 MG CAPS 1 by mouth at bedtime Initial call taken by: Lamar Sprinkles, CMA,  June 26, 2010 10:09 AM  Follow-up for Phone Call        ok for refill x 5 Follow-up by: Jacques Navy MD,  June 26, 2010 1:28 PM    Prescriptions: TEMAZEPAM 30 MG CAPS (TEMAZEPAM) 1 by mouth at bedtime  #30 x 5   Entered by:   Ami Bullins CMA   Authorized by:   Jacques Navy MD   Signed by:   [name] CMA on 06/27/2010   Method used:   Telephoned to ...       [facility]. [phone]* (retail)       [phone] [address]       [place], [place]  [address]       Ph: [phone]       Fax: [phone]   RxID:   [id number]

## 2010-08-14 ENCOUNTER — Ambulatory Visit (INDEPENDENT_AMBULATORY_CARE_PROVIDER_SITE_OTHER): Payer: Medicare Other | Admitting: Internal Medicine

## 2010-08-14 ENCOUNTER — Encounter: Payer: Self-pay | Admitting: Internal Medicine

## 2010-08-14 DIAGNOSIS — K7030 Alcoholic cirrhosis of liver without ascites: Secondary | ICD-10-CM

## 2010-08-14 DIAGNOSIS — K589 Irritable bowel syndrome without diarrhea: Secondary | ICD-10-CM

## 2010-08-14 DIAGNOSIS — R252 Cramp and spasm: Secondary | ICD-10-CM

## 2010-08-21 NOTE — Assessment & Plan Note (Signed)
Summary: PER PT FU  STC   Vital Signs:  Patient profile:   77 year old male Height:      69 inches Weight:      196 pounds BMI:     29.05 O2 Sat:      95 % on Room air Temp:     97.5 degrees F oral Pulse rate:   71 / minute BP sitting:   120 / 62  (left arm) Cuff size:   regular  Vitals Entered By: [name] CMA (August 14, 2010 9:13 AM)  O2 Flow:  Room air  Primary Care Provider:  Jozlin Bently   History of Present Illness: Having continued leg cramps. He has been taking gabapentin 30-0mg  qhs  He had some strange feelings and went to Dr. Rolland Bimler, neuro, negative work up for stroke. He was advised to increase  gabapenitin to two times a day for leg cramps.  He needs traveling meds: cipro,   He is drinking wine and asked what he should limit his intake to. I advised that abstention would be best, but if he is going to drink wine he should limit himself to 8 oz per day.  He has a h/o IBS and reports that he does have intermittent bouts of frequent loose stools. He is advised to take a bulk laxative on a daily basis to regulate his  bowel habit. He can use hysoscamine as needed for bloating and pain.  He is asking for a referral to a new audioloigst - referred him to Dr Maren Reamer at [facility]  He has dark toenails. On inspection this is mild fungal infection that does not require treatment.  Reviewed all his meds and renewed several. Reviewed last labs: june '11 - will need routine exam in June.  Onc- he is to see [name] for routine follow -up,  no imaging before hand. He has been doing well.  Social - he is going to [place] for 3 week. IN [place] he is taking a crowd to the [place] on the [employer].   Current Medications (verified): 1)  Advair Diskus 250-50 Mcg/dose Misc (Fluticasone-Salmeterol) .... Inhale One Dose By Mouth Every 12 Hours 2)  Toprol Xl 50 Mg Xr24h-Tab (Metoprolol Succinate) .Marland Kitchen.. 1 & 1/2 Once Daily 3)  Nexium 40 Mg Cpdr (Esomeprazole Magnesium) .... Take 1 Tablet  By Mouth Once A Day 4)  Hyomax-Sl 0.125 Mg  Subl (Hyoscyamine Sulfate) .Marland Kitchen.. 1 Q 2 Hrs As Needed Bloating, Gas & Cramps 5)  Simvastatin 20 Mg  Tabs (Simvastatin) .... Take 1 Tablet By Mouth Once A Day 6)  Potassium Chloride 20 Meq  Pack (Potassium Chloride) .... Take 1 Tablet By Mouth Once A Day 7)  Adult Aspirin Low Strength 81 Mg  Tbdp (Aspirin) .... Take 1 Tablet By Mouth Once A Day 8)  Viagra 100 Mg Tabs (Sildenafil Citrate) .... 1/2  or 1 As Needed 9)  Proair Hfa 108 (90 Base) Mcg/act  Aers (Albuterol Sulfate) .... As Needed 10)  Celebrex 100 Mg  Caps (Celecoxib) .Marland Kitchen.. 1 By Mouth Two Times A Day. 11)  Temazepam 30 Mg Caps (Temazepam) .Marland Kitchen.. 1 By Mouth At Bedtime 12)  Magnesium Oxide 400 Mg  Caps (Magnesium Oxide) .Marland Kitchen.. 1 By Mouth Once Daily 13)  Diphenoxylate-Atropine 2.5-0.025 Mg Tabs (Diphenoxylate-Atropine) .... Take One Tab After Each Loose Stool Max 4 A Day 14)  Quinine Hcl Dihydrate  Crys (Quinine Hcl Dihydrate) .Marland Kitchen.. 1 By Mouth At Bedtime For Leg Cramps 15)  Ciprofloxacin Hcl 500  Mg Tabs (Ciprofloxacin Hcl) .Marland Kitchen.. 1 By Mouth Two Times A Day X 5 Days For Diarrhea Abroad, X 7 Days For Respiratory Infection 16)  Gabapentin 300 Mg Caps (Gabapentin) .Marland Kitchen.. 1 By Mouth At Bedtime 17)  Joint Flex Cream .... As Needed  Allergies (verified): 1)  ! 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 fib) Prostatitis - chronic bacterial pseudogout chronic sinus problems cellulitis - 2nd to T. Pedis hearing loss   Physician roster:                  GI-Dr.  Jarold Motto                  Card - Dr. Antoine Poche                  Onc - [name]                  GU - Dr. Ermelinda Das - Dr. Delford Field                  Psych  - Dr. Dellia Cloud  Past Surgical History: Last updated: 10/21/2007 EGD (01/27/2002) Inguinal herniorrhaphy '79 torn biceps repair FH reviewed for relevance, SH/Risk Factors reviewed for relevance  Review of Systems       The patient complains of weight gain.  The patient denies anorexia, fever, weight loss, chest pain, dyspnea on exertion, headaches, abdominal pain, muscle weakness, suspicious skin lesions, unusual weight change, enlarged lymph nodes, and angioedema.    Physical Exam  General:  Well-developed,well-nourished,in no acute distress; alert,appropriate and cooperative throughout examination Head:  normocephalic and atraumatic.   Eyes:  C&S clear Lungs:  normal respiratory effort.   Heart:  normal rate and regular rhythm.   Msk:  normal ROM.   Neurologic:  alert & oriented X3.   Skin:  mild nail thickening all toes with a dark hue. Psych:  Oriented X3, normally interactive, and good eye contact.     Impression & Recommendations:  Problem # 1:  LEG CRAMPS (ICD-729.82) still a problem. He will increase gabapentin to 300mg  two times a day. Advised to try GinkoBiloba for leg cramps.  Problem # 2:  ALCOHOLIC CIRRHOSIS OF LIVER (ICD-571.2) stable based on last labs June '11. Did advise him to limit wine to 2 glasses per day and warned him of the "creep" of increased use in alcoholics. He is advised to continue with AA.  Problem # 3:  IRRITABLE BOWEL SYNDROME (ICD-564.1) Discussed use of bulk laxatives per HPI.  Complete Medication List: 1)  Advair Diskus 250-50 Mcg/dose Misc (Fluticasone-salmeterol) .... Inhale one dose by mouth every 12 hours 2)  Toprol Xl 50 Mg Xr24h-tab (Metoprolol succinate) .Marland Kitchen.. 1 & 1/2 once daily 3)  Nexium 40 Mg Cpdr (Esomeprazole magnesium) .... Take 1 tablet by mouth once a day 4)  Hyomax-sl 0.125 Mg Subl (Hyoscyamine sulfate) .Marland Kitchen.. 1 q 2 hrs as needed bloating, gas & cramps 5)  Simvastatin 20 Mg Tabs (Simvastatin) .... Take 1 tablet by mouth once a  day 6)  Potassium Chloride 20 Meq Pack (Potassium chloride) .... Take 1 tablet by mouth once a day 7)  Adult Aspirin Low Strength 81 Mg Tbdp (  Aspirin) .... Take 1 tablet by mouth once a day 8)  Viagra 100 Mg Tabs (Sildenafil citrate) .... 1/2  or 1 as needed 9)  Proair Hfa 108 (90 Base) Mcg/act Aers (Albuterol sulfate) .... As needed 10)  Celebrex 100 Mg Caps (Celecoxib) .Marland Kitchen.. 1 by mouth two times a day. 11)  Temazepam 30 Mg Caps (Temazepam) .Marland Kitchen.. 1 by mouth at bedtime 12)  Magnesium Oxide 400 Mg Caps (Magnesium oxide) .Marland Kitchen.. 1 by mouth once daily 13)  Quinine Hcl Dihydrate Crys (Quinine hcl dihydrate) .Marland Kitchen.. 1 by mouth at bedtime for leg cramps 14)  Ciprofloxacin Hcl 500 Mg Tabs (Ciprofloxacin hcl) .Marland Kitchen.. 1 by mouth two times a day x 5 days for diarrhea abroad, x 7 days for respiratory infection 15)  Gabapentin 300 Mg Caps (Gabapentin) .Marland Kitchen.. 1 by mouth two times a day 16)  Joint Flex Cream  .... As needed 17)  Triamcinolone Acetonide 0.1 % Crea (Triamcinolone acetonide) .... Apply two times a day to skin rash as needed. avoid use of face. Prescriptions: TRIAMCINOLONE ACETONIDE 0.1 % CREA (TRIAMCINOLONE ACETONIDE) apply two times a day to skin rash as needed. Avoid use of face.  #80g x 1   Entered and Authorized by:   Jacques Navy MD   Signed by:   Jacques Navy MD on 08/14/2010   Method used:   Electronically to        [facility]. [phone]* (retail)       [phone]       [place], [place]  [address]       Ph: [phone]       Fax: [phone]   RxID:   [id number] GABAPENTIN 300 MG CAPS (GABAPENTIN) 1 by mouth two times a day  #60 x 12   Entered and Authorized by:   Jacques Navy MD   Signed by:   Jacques Navy MD on 08/14/2010   Method used:   Electronically to        [facility]. [phone]* (retail)       [phone] [address]       [place], [place]  [address]       Ph: [phone]       Fax:  [phone]   RxID:   [id number] CIPROFLOXACIN HCL 500 MG TABS (CIPROFLOXACIN HCL) 1 by mouth two times a day x 5 days for diarrhea abroad, x 7 days for respiratory infection  #21 x 0   Entered and Authorized by:   Jacques Navy MD   Signed by:   Jacques Navy MD on 08/14/2010   Method used:   Electronically to        [facility]. [phone]* (retail)       [phone] [address]       [place], [place]  [address]       Ph: [phone]       Fax: [phone]   RxID:   [id number] NEXIUM 40 MG CPDR (ESOMEPRAZOLE MAGNESIUM) Take 1 tablet by mouth once a day  #30 Capsule x 12   Entered and Authorized by:   Jacques Navy MD   Signed by:   Jacques Navy MD on 08/14/2010   Method used:   Electronically to        [facility]. [phone]* (retail)  [address]       [place], [place]  [address]       Ph: [phone]       Fax: [phone]   RxID:   [id number] TOPROL XL 50 MG XR24H-TAB (METOPROLOL SUCCINATE) 1 & 1/2 once daily  #45 x 12   Entered and Authorized by:   Jacques Navy MD   Signed by:   Jacques Navy MD on 08/14/2010   Method used:   Electronically to        [facility]. [phone]* (retail)       [phone] [address]       [place], [place]  [address]       Ph: [phone]       Fax: [phone]   RxID:   [id number]    Orders Added: 1)  Est. Patient Level II [[address]]

## 2010-08-31 ENCOUNTER — Other Ambulatory Visit: Payer: Self-pay | Admitting: Oncology

## 2010-08-31 ENCOUNTER — Encounter (HOSPITAL_BASED_OUTPATIENT_CLINIC_OR_DEPARTMENT_OTHER): Payer: Medicare Other | Admitting: Oncology

## 2010-08-31 DIAGNOSIS — C8590 Non-Hodgkin lymphoma, unspecified, unspecified site: Secondary | ICD-10-CM

## 2010-08-31 DIAGNOSIS — C8299 Follicular lymphoma, unspecified, extranodal and solid organ sites: Secondary | ICD-10-CM

## 2010-08-31 LAB — COMPREHENSIVE METABOLIC PANEL
Alkaline Phosphatase: 58 U/L (ref 39–117)
BUN: 18 mg/dL (ref 6–23)
CO2: 27 mEq/L (ref 19–32)
Creatinine, Ser: 0.97 mg/dL (ref 0.40–1.50)
Glucose, Bld: 129 mg/dL — ABNORMAL HIGH (ref 70–99)
Sodium: 138 mEq/L (ref 135–145)
Total Bilirubin: 0.6 mg/dL (ref 0.3–1.2)
Total Protein: 6.6 g/dL (ref 6.0–8.3)

## 2010-08-31 LAB — LACTATE DEHYDROGENASE: LDH: 177 U/L (ref 94–250)

## 2010-08-31 LAB — CBC WITH DIFFERENTIAL/PLATELET
Basophils Absolute: 0 10*3/uL (ref 0.0–0.1)
Eosinophils Absolute: 0.2 10*3/uL (ref 0.0–0.5)
HCT: 39.5 % (ref 38.4–49.9)
HGB: 13.3 g/dL (ref 13.0–17.1)
MCV: 92.7 fL (ref 79.3–98.0)
MONO%: 11.3 % (ref 0.0–14.0)
NEUT#: 4.7 10*3/uL (ref 1.5–6.5)
RDW: 13.9 % (ref 11.0–14.6)
lymph#: 1.7 10*3/uL (ref 0.9–3.3)

## 2010-09-06 LAB — GLUCOSE, CAPILLARY: Glucose-Capillary: 150 mg/dL — ABNORMAL HIGH (ref 70–99)

## 2010-09-10 LAB — GLUCOSE, CAPILLARY: Glucose-Capillary: 129 mg/dL — ABNORMAL HIGH (ref 70–99)

## 2010-10-05 ENCOUNTER — Other Ambulatory Visit: Payer: Self-pay | Admitting: Internal Medicine

## 2010-10-10 ENCOUNTER — Other Ambulatory Visit: Payer: Self-pay | Admitting: Internal Medicine

## 2010-11-06 NOTE — Assessment & Plan Note (Signed)
[facility]                            CARDIOLOGY OFFICE NOTE   Jack [name], Jack [name]                         MRN:          [id number]  DATE:04/15/2007                            DOB:          1928-11-04    PRIMARY CARE PHYSICIAN:  Dr. Illene Regulus.   REASON FOR PRESENTATION:  Evaluate patient with PACs.   HISTORY OF PRESENT ILLNESS:  The patient is 77 years old. He was seen in  the past by Dr. Gabriel Rung for evaluation of arrhythmias. He was felt to have  PACs. From a cardiovascular standpoint, he has gotten along well. He has  been treated from lymphoma with CHOP. He is in remission from this. He  is exercising. He is not developing any chest discomfort or shortness of  breath, though he has a little emphysema. He has no PND or orthopnea. He  is not noticing any palpitations, pre-syncope, or syncope.   PAST MEDICAL HISTORY:  Mild bilateral ileac stenosis, lymphoma,  hyperlipidemia, gastroesophageal reflux disease, carpal tunnel,  osteoarthritis, mild emphysema.   ALLERGIES:  PENICILLIN.   MEDICATIONS:  1. Advair 250/50 b.i.d.  2. Nexium 40 mg daily.  3. Aspirin 81 mg daily.  4. Simvastatin 20 mg daily.  5. Potassium 20 mEq daily.  6. Metoprolol 75 mg daily.  7. Levsin 0.125 mg daily.  8. Sulfamethoxazole recently started.   REVIEW OF SYSTEMS:  Positive for leg cramping, otherwise as stated in  the HPI and otherwise negative for other systems.   PHYSICAL EXAMINATION:  GENERAL:  The patient is in no distress.  VITAL SIGNS:  Blood pressure 110/70, heart rate 74 and regular, weight  186 pounds, body mass index 28.  NECK:  No jugular vein distension at 45 degrees, carotid upstroke brisk  and symmetric, no thyromegaly.  LYMPHATICS:  No cervical, axillary, or inguinal adenopathy.  LUNGS:  Clear to auscultation bilaterally.  CHEST:  Unremarkable.  HEART:  PMI not displaced or sustained, S1 and S2 within normal limits,  no S3, no S4, no clicks, rubs,  or murmurs.  ABDOMEN:  Mildly obese, positive bowel sounds, normal to frequency and  pitch, no bruits, no rebound, no guarding, no midline pulsatile mass, no  organomegaly.  SKIN:  No rashes, no nodules.  EXTREMITIES:  2+ pulses, no edema.   EKG sinus rhythm, rate 74, leftward axis, early transition lead in V2, R  subprime in V1 and V2, no acute ST-T wave change.   ASSESSMENT AND PLAN:  1. Leg cramps. This is the patient's only question today. He wondered      if this could be related to the Simvastatin. I told him that this      is not the usual presentation with the myositis of statins. This is      probably an etiopathic thing. I have suggested tonic water which      may contain some quinine to see if this helps. He has already been      put on potassium in the past for this without improvement.  2. Palpitations. The patient is not having any  symptomatic      dysrhythmia. No further evaluation is warranted.  3. Mild ileac disease. This is certainly not causing any symptoms. He      should continue with the risk reduction.  4. Follow up. We will see the patient back in this clinic as needed.     Rollene Rotunda, MD, [facility]  Electronically Signed    JH/MedQ  DD: 04/15/2007  DT: 04/16/2007  Job #: [id number]   cc:   Rosalyn Gess. [name], MD

## 2010-11-06 NOTE — Assessment & Plan Note (Signed)
[facility]                         GASTROENTEROLOGY OFFICE NOTE   Jack [name], Jack [name]                         MRN:          [id number]  DATE:01/27/2007                            DOB:          05-24-1929    Jack [name] is a 77 year old white male former [employer], [employer]  [employer].  I have been following him along with Dr. Debby [name] for several  years now because of alcoholism with associated alcoholic liver disease  and Child's A cirrhosis with associated mild encephalopathy.  Since I  last saw him in April of 2007, he has been diagnosed with follicular  lymphoma, grade III stage II and receiving chemotherapy per Dr. Clelia [name].   Jack [name] has been bothered by diverticulosis and last had colonoscopy 2 years  ago.  He has chronic GERD, for which he takes Nexium 20 mg a day.  He  recently saw Dr. Jonny [name] with lower abdominal pain, gas and bloating.  Was  felt to possibly have a urinary tract infection and was treated with  Cipro for 10 days.  Cultures showed 50,000 colonies of E. coli.  He had  a flat and upright KUB on January 05, 2007, which was unremarkable.  He has  not had any evidence that I am aware of, of variceal hemorrhage or  ascites.   He does have chronic atrial fibrillation and is followed by  cardiologist.  He also has been a heavy smoker in the past and has  emphysema.   His abdominal pain seems to have abated, but he continues with some  vague gas and bloating, and was having regular bowel movements.  He  denies any hepatobiliary complaints.  He did have a CT scan of the  abdomen within the last year that was unremarkable.  He has had no  melena or hematochezia, and follows a fairly regular diet.  He continues  to drink wine, but I do not use the hard stuff.  Somewhat  surprisingly, he is going to [name] and also is serving as a  sponsor, which is really surprising since he has not quit drinking.   MEDICATIONS:  1. Advair 250/50 twice  a day.  2. Nexium 40 mg a day.  3. Ambien CR 12.5 mg at bedtime.  4. Aspirin 81 mg a day.  5. Simvastatin 20 mg a day for hyperlipidemia.  6. Potassium 20 mEq a day.  7. Metoprolol 75 mg a day.  8. P.r.n. Levsin 0.125 mg.  9. He is supposed to be on Xifaxan for his encephalopathy, but does      not take this medication.   He has a history of PENICILLIN allergy.   He uses p.r.n. Tylenol, albuterol, and Viagra.  >/Exam/>  He is a healthy-appearing white male appearing younger than his stated  age.  He has an extremely good suntan, but I cannot appreciate stigmata of  chronic liver disease.  His mental status is entirely clear, and there is no asterixis.  Chest was clear to percussion and auscultation.  He did have diminished  breath sounds in both lung fields.  He appeared to be in a steady rhythm without significant murmurs,  gallops, or rubs.  I could not appreciate hepatosplenomegaly, abdominal masses, tenderness,  or ascites.  Bowel sounds were normal.  Peripheral extremities were unremarkable without edema, phlebitis, or  swollen joints.  His memory was intact and he was oriented x3.   ASSESSMENT:  1. Chronic alcoholic liver disease with continued ethanol use in      moderation.  2. Chronic obstructive lung disease - emphysema.  3. Chronic reflux on Nexium therapy.  4. Diverticulosis coli with possible recent episodes of      diverticulitis.  5. Chronic atrial fibrillation, not on Coumadin therapy.  6. Non-Hodgkin's lymphoma being treated with CHOP therapy.  7. Hyperlipidemia on simvastatin.  8. Chronic insomnia.   RECOMMENDATIONS:  1. Check carbohydrate deficient transferrin level to see actually what      his level of ethanol intake is.  2. Check alpha-fetoprotein level, prothrombin time, and prothrombin      time.  3. Trial of Align probiotic therapy.  4. High fiber diet as tolerated with daily Citrucel.  5. Continue p.r.n. Levsin use.  6. Check serum ammonia  level.     Jack [name]. Jack Motto, MD, [facility], FAGA  Electronically Signed    DRP/MedQ  DD: 01/27/2007  DT: 01/27/2007  Job #: [id number]   cc:   Jack Gess. [name], MD  Jack [name]. [employer]

## 2010-11-06 NOTE — Letter (Signed)
March 16, 2007    Jack [name], M.D.  [address] [place]  [place], [place] [address]   RE:  Jack [name]  MRN:  [id number]  /  DOB:  [birth year]   Dear Jack [name],   Thank you very much for seeing Jack [name] for evaluation of possible  bladder abnormality.   Jack [name] is a very pleasant 77 year old gentleman who presents to the  office with a seven or eight weeks history of intermittent low back  pain, lower abdominal pain and discomfort, nocturia, and other  increasing signs of prostatism with slow stream.  He was seen on the  14th; and again more recently on September 10th by Dr. Efrain [name].  In  both instances he was treated for possible cystitis/prostatitis.  During  his evaluation he did have one urinalysis, which was positive and did  grow out E. coli.  His most recent urinalysis dated September 10th was  positive for nitrites and leukocyte esterase.  He most recently was  treated in September with ciprofloxacin 500 mg b.i.d. for 10 days.  The  patient does have a mild history of prostate enlargement in the past.   The patient is in no night sweats, he has had no significant weight  loss.  He has had no hematuria, including microscopic exam revealed 0-2  rbc's per high-power field.   Past medical history is significant for a history of lymphoma with  complete remission and treatment after chemotherapy.  The patient also  follows up with carpal tunnel syndrome, GERD, osteoarthritis, and he  does have a primary emphysema.   Current medications Advair 250/50 b.i.d., Nexium 40 mg daily, Ambien CR  on a p.r.n. basis, aspirin 81 mg daily, simvastatin 20 mg daily,  potassium 20 mEq daily, metoprolol 75 mg daily, Levsin 0.125 mg daily.  He takes albuterol on a p.r.n. basis. Viagra on a p.r.n. basis.   In the office today the patient was noted to have what was felt to be a  normal-sized firm, prostate without nodules.  He was mildly tender.  He  did have significant  abdominal tenderness to deep palpation.  He had no  CVA tenderness.   Laboratory data included an acute abdominal series with chest performed  on January 05, 2007, which was nonspecific but no evidence of  pneumoperitoneum.  No evidence of acute cardiopulmonary disease.  The  patient's last CT of the abdomen and pelvis, I think, was then more  recently at the cancer center.  The last study that I have was September 19, 2005, which was an unremarkable study with no significant abnormalities  noted.  Specifically, no evidence of pelvic adenopathy.  The patient  also has had remedial ultrasound report performed on April 30, 2006  which showed changes of nonspecific renal disease as evidenced by  cortical thinning and minimally elevated cortical echogenicity with a  stable.  7.7 cm, upper pole left renal cyst, otherwise a negative study.   Working diagnoses at today's visit is persistent prostatitis.  I am  treating him with Cipro 500 mg b.i.d. for 14 days with one refill if  symptoms do not clear.  However, the patient is concerned, and I share  his concern, that this may very well represent an underlying abnormality  in the bladder, particularly worrisome would be a recurrent neoplasm  given his history of lymphoma.   I appreciate your assistance in evaluating this nice gentleman.  If I  can provide any additional  information to you.  Please do not hesitate  to contact me.   Thank you, once again, for helping me evaluate this nice patient.  I  look forward to hearing from you.    Sincerely,      Jack Gess. [name], MD  Electronically Signed    Jack [name]  DD: 03/16/2007  DT: 03/16/2007  Job #: [id number]

## 2010-11-09 NOTE — Op Note (Signed)
NAMEBRENNER, VISCONTI                  ACCOUNT NO.:  [id number]   MEDICAL RECORD NO.:  [id number]          PATIENT TYPE:  INP   LOCATION:  3041                         FACILITY:  [facility]   PHYSICIAN:  Coletta Memos, M.D.     DATE OF BIRTH:  06/03/1929   DATE OF PROCEDURE:  10/19/2004  DATE OF DISCHARGE:                                 OPERATIVE REPORT   PREOPERATIVE DIAGNOSIS:  1.  Displaced disk, right far lateral L3-4.  2.  Right L4-5 displaced disk.  3.  Lumbar radiculopathy.   POSTOPERATIVE DIAGNOSES:  1.  Displaced disk, right far lateral L3-4.  2.  Right L4-5 displaced disk.  3.  Lumbar radiculopathy.   PROCEDURE:  1.  Right far lateral diskectomy L3-4 with microscopic dissection.  2.  Right L4-5 semi-hemilaminectomy and diskectomy with microdissection.   COMPLICATIONS:  None.   SURGEON:  Coletta Memos, M.D.   ASSISTANT:  Stefani Dama, M.D.   INDICATIONS:  Mr. Adeoluwa Silvers is a 77 year old who has had severe pain over  the last few weeks in his right lower extremity. MRI shows two herniated  disks neither of which I can determine is the absolute cause of his problem.  Both being large, one a far lateral disk herniation at L3-4, the other a  disk herniation with apparent fragment which has migrated rostrally. I  recommended and he agreed to undergo operative decompression.   OPERATIVE NOTE:  Mr. Weakland was brought to the operating room, intubated,  and placed under general anesthetic. His back was prepped and he was draped  in sterile fashion. I infiltrated 20 mL of 0.5% lidocaine with 1:20,000  strength epinephrine. I opened the skin with a #10 blade and took this down  to the thoracolumbar fascia. I then exposed the lamina of what were L3 and  L4. I placed a double ended ganglion knife inferior to the lamina of L3. X-  ray showed that I was at the correct position. I then exposed the pars  interarticularis of L3. I used a high-speed drill to drill a small portion  of the  lateral surface of the pars. I was able to then get underneath the  ligamentum flavum and expose the L3 nerve root. At that time, the nerve root  was under a tremendous amount of pressure. Using a nerve hook, I was able to  breech the capsule and pull out disk material. Then using the nerve hook  further with microdissection, I was able to work out what was a fair amount  of disk material. I went into the disk space and removed more disk. I felt  that the nerve root was then well decompressed. I placed some Gelfoam and  then turned my attention to the L4-5 disk space. I performed a semi-  hemilaminectomy using a high-speed drill at L4. I exposed the thecal sac and  retracted the thecal sac medially. I opened the disk space and removed disk  material. I also removed disk which was subligamentous and had gone somewhat  rostrally. Dr. Danielle Dess assisted. We removed a great deal  of disk and I then  felt that the nerve roots of L4 and L5 were well decompressed. I then  irrigated the wound. I  then placed Depo-Medrol over the far lateral resection site. I then closed  the wound in the layer of fascia using Vicryl sutures. Vicryl sutures were  used also to reapproximate subcutaneous tissue and skin edges. Dermabond  used for a sterile dressing. The patient tolerated the procedure well.      KC/MEDQ  D:  10/19/2004  T:  10/20/2004  Job:  [id number]

## 2010-11-09 NOTE — Assessment & Plan Note (Signed)
[name]                              CARDIOLOGY OFFICE NOTE   Jack [name], Jack [name]                         MRN:          [id number]  DATE:05/06/2006                            DOB:          Nov 10, 1928    [name] is a very pleasant 77 year old white male, recent widower with  lymphoma, now with completed chemotherapy.   He was seen by Dr. Debby [name] on April 29, 2006 for evaluation.  His weight  has dropped from 190 to 168 but is stabilized now.   Patient has no prior cardiac history.  He did have a transthoracic  echocardiogram in April 2007 with a EF of 55-60%.  There is a question of a  possible bicuspid valve, although valve excursion was normal, as well as the  transvalvular velocity.  The left atrium was mildly dilated.  He had an  adenosine Myoview a year and a half ago October 01, 2004 with EF of 55% and no  effusion defect.   Patient had no cardiac symptoms.  he has never had palpitations.  he has had  some shortness of breath at times.  He had some hypertension and mild  hyperlipidemia, with recent LDL of 117.   SOCIAL HISTORY:  His wife recently died in 2022-12-06.  She had had a previous  stroke.  She died in her sleep.  Patient has a past history of back surgery,  lymphoma as noted.  He is a retired Geneticist, molecular, exercises regularly.  He has 3 children, as well as  grandchildren.   ALLERGIES:  TO PENICILLIN.   FAMILY HISTORY:  Father died of emphysema.  .  Two brothers died of colon  cancer and a heart attack.   REVIEW OF SYSTEMS:  HEENT:  Unremarkable.  CARDIOVASCULAR:  As note above.  GI:  History negative.  GU:  Unremarkable.   Patient is seen now for evaluation of atrial fibrillation diagnosed on  April 29, 2006.   PHYSICAL EXAMINATION:  VITAL SIGNS:  Blood pressure 118/50, pulse 66, normal  sinus rhythm, PACs,  GENERAL APPEARANCE:  Normal.  NECK:  JVP is not elevated.  Carotid pulses bilaterally equal without  bruits.  LUNGS:  Clear.  CARDIAC:  Normal, no murmur or gallop.  ABDOMEN:  Normal.  EXTREMITIES:  Normal. Pulses _palpable and equal bilaterally.   Reviewing the EKGs and rhythm strips from April 29, 2006, I noted that he  is actually in normal sinus rhythm, with frequent PACs, although the  computer interpretation is that of atrial fibrillation.  The EKG today  reveals normal sinus rhythm, frequent PACs occurring approximately every  fourth beat, otherwise normal.   IMPRESSION:  1. No evidence of cardiac disease, except for frequent atrial premature      beats.  2. Lymphoma with weight loss.  3. Moderate hyperlipidemia.  4. Hypertension.   PLAN:  I have reviewed the findings with the patient and suggested that he  could discontinue the coumadin at this time.  We will plan to do a Holter  monitor and a BMP.  Because of the  atrial ectopy, I suggest that he decrease  his caffeine intake.  I will be happy to see him again in 4-6 weeks.  Certainly, if there is any further symptoms, I will be glad to see him prior  to that time.  If he has evidence of atrial fibrillation on the holter  monitor, then we plan to restart the Coumadin.  Thank you for the  opportunity of sharing in this nice gentleman's care.     Jack Cranker, MD, [facility]  Electronically Signed    EJL/MedQ  DD: 05/06/2006  DT: 05/06/2006  Job #: [id number]   cc:   Jack Gess. [name], MD

## 2010-11-09 NOTE — Assessment & Plan Note (Signed)
[name]                            CARDIOLOGY OFFICE NOTE   MAGIC, MOHLER                         MRN:          [id number]  DATE:07/01/2006                            DOB:          02-20-1929    PRIMARY CARE PHYSICIAN:  Rosalyn Gess. [name], M.D.   CARDIOLOGIST:  Cecil Cranker, M.D.   HISTORY:  This is a 77 year old white male patient whom Dr. Corinda Gubler  initially saw for the question of atrial fibrillation.  It turned out  that he was in normal sinus rhythm with PACs.  He was told to decrease  his caffeine and underwent an exercise treadmill on June 11, 2006.  The patient had clear moderate exercise tolerance with increased  supraventricular ectopy with short runs of supraventricular tachycardia  post-exercise.  At that time Dr. Corinda Gubler asked him to cut his caffeine  intake again, and he changed his atenolol/hydrochlorothiazide to Toprol  XL 75 mg.   The patient just realized this morning that he has only been taking  Toprol XL 50 mg, instead of the 75 mg.  He has just about cut out all  caffeine.  He stopped drinking [facility] and has very little coffee  any more.  He is asymptomatic as far as the palpitations are concerned,  and he just started working out with the trainer, to try to start  exercising again, as he has been unable, since he underwent chemotherapy  for a lymphoma.   CURRENT MEDICATIONS:  1. Advair 250/50 mg b.i.d.  2. Nexium 40 mg daily.  3. Ambien CR 12.5 mg q.h.s.  4. K-Dur 20 mEq b.i.d.  5. Toprol XL 50 mg.  He is only taking one daily.   PHYSICAL EXAMINATION:  GENERAL:  This is a pleasant 77 year old white  male, in no acute distress.  VITAL SIGNS:  Blood pressure 106/54, pulse 76, weight 174 pounds.  NECK:  Without jugular venous distention, HSR, bruit or thyroid  enlargement.  LUNGS:  Clear anterior, posterior and lateral.  HEART:  A regular rate and rhythm with frequent ectopy.  Normal S1, S2.  No  murmur heard.  ABDOMEN:  Soft, without organomegaly, mass lesions or abnormal  tenderness.  EXTREMITIES:  Without clubbing, cyanosis or edema.  Good distal pulses.   IMPRESSION:  1. Short runs of supraventricular tachycardia, post-exercise.  2. History of lymphoma, treated with chemotherapy.  3. Hypertension.  4. Mild hyperlipidemia.  5. Echocardiogram in April 2007, with an ejection fraction of 55%-60%.  6. Recent Doppler of the aorta showed normal abdominal aorta with a      proximal common iliac artery, without evidence of focal dilatation,      mild atherosclerosis with mild stenosis of the common iliac      arteries, right greater than left.  An electrocardiogram today      revealed a normal sinus rhythm at 82 beats per minute with      premature apical contractions.   PLAN:  1. I will increase his Toprol to 75 mg q.d.  As initially stated, his  blood pressure is somewhat low today, so I will ask him to keep an      eye on his blood pressure and let us know if he has any dizziness      at this higher dose.  2. He will see Dr. Celso Sickle [place] back in two months, as scheduled.      Jacolyn Reedy, PA-C  Electronically Signed      Jesse Sans. Daleen Squibb, MD, [facility]  Electronically Signed   ML/MedQ  DD: 07/01/2006  DT: 07/01/2006  Job #: [id number]   cc:   Rosalyn Gess. [name], MD

## 2010-11-09 NOTE — Assessment & Plan Note (Signed)
[facility]                             PRIMARY CARE OFFICE NOTE   Jack [name]                         MRN:          [id number]  DATE:04/29/2006                            DOB:          09-16-1928    Jack [name] is very well known to me, followed for primary emphysema, GERD,  osteoarthritis, and he has been followed and treated for lymphoma.  The  patient presents today for followup.   The patient reports he is actually feeling great, doing better than he has  for some time.  He has finished his most recent and hopefully last [name] of  chemotherapy.  During the [name] of his treatment he has lost down from 190  to 168 pounds.  The patient does have a followup evaluation scheduled with  Dr. Clelia [name] in December and he is supposed to have PET scanning in March.   ETOH abuse.  The patient is doing very well.  He continues with AA and finds  this a very supportive group that has been sustaining for him during his  treatment for lymphoma and the recent loss of his wife.  The patient reports  he does have an occasional glass of wine but basically is 99% abstemious.  He does have a sponsor who is aware of his wine use.   INTERVAL SOCIAL HISTORY:  The patient reports he has remained very busy.  He  has spent some time with his grandchildren in [place].  He has remodeled  his home and his beach house.  He is doing some traveling and leaving for  [name] tomorrow.  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  In addition the patient shares  with me that he is donating and dedicating a Retail banker at the  Jack [name] in honor of his wife Jack [name], who was lost to  [place] during this year.   PAST MEDICAL HISTORY:  Well documented in my note of September 04, 2004, as is  his social history.   CURRENT  MEDICATIONS:  1. Advair 250/50, one inhalation a.m. and h.s.  2. Nexium 40 mg daily.  3. Ambien CR 12.5 mg q.h.s.  4. Lopressor HCT 25/50, once daily.  5. Viagra on a p.r.n. basis.  6. Campral 333 mg p.r.n.  7. Flexeril 5 mg t.i.d. p.r.n.  8. Lomotil p.r.n.   REVIEW OF SYSTEMS:  The patient has had no fevers, sweats or chills.  He has  had weight loss as noted.  He does have insomnia, well managed with Ambien.  The patient reports he is due for an eye exam.  He has had no dental or ENT  complaints.  No cardiovascular, respiratory, GI problems.  GU:  The patient  does have nocturia but only when he is on prednisone.  MUSCULOSKELETAL:  The  patient complains of some hand stiffness and cramping.  Quinine helps.  No  dermatologic, neurologic complaints.  PHYSICAL EXAMINATION:  VITAL SIGNS:  Temperature was 98.2, blood pressure  112/57, pulse 66, weight 168.  [name] APPEARANCE:  A slender gentleman in no acute distress who is looking  very healthy at this point.  HEENT:  Normocephalic atraumatic.  Right EAC was clear.  Hearing aid in the  left EAC and not examined.  Oropharynx with native dentition in good repair.  No buccal lesions were noted.  Posterior pharynx was clear.  Conjunctivae  and sclerae were clear.  NECK:  Supple without thyromegaly.  LYMPH NODES:  No adenopathy was noted in the cervical, supraclavicular  regions.  CHEST:  No CVA tenderness.  LUNGS:  Clear to auscultation and percussion.  CARDIOVASCULAR:  With 2+ radial pulse.  He had no JVD.  No carotid bruits.  His precordium was quiet.  He had an irregularly irregular heart rhythm.  I  did not appreciate any murmurs.  ABDOMEN:  Soft.  No guarding or rebound.  GENITAL:  Deferred.  RECTAL:  Deferred.  EXTREMITIES:  Without clubbing, cyanosis, or edema.   DATABASE:  Cholesterol is 231, triglycerides 58, HDL was 91.5, LDL was  117.6.  Liver functions were normal.  PSA was 2.39 and on reviewing his  chart, it was  1.6, then up to 3.9, now down to 2.39.  Ammonia level was 12.   A 12-lead electrocardiogram revealed the patient to have atrial fibrillation  with a heart rate of 68-70.   ASSESSMENT/PLAN:  1. Oncology.  The patient is to complete his last round of chemotherapy      for his lymphoma and is doing well.  I think he has attained a      successful remission.  He will follow up with Dr. Clelia [name] as noted.  2. Lipids.  The patient's control is excellent with a very high HDL and a      low LDL.  3. Gastroesophageal reflux disease.  Well controlled on his present      medications.  4. Pulmonary.  The patient is stable on his present medical regimen.  5. Insomnia.  The patient is stable with daily use of Ambien CR.  6. Cardiovascular.  The patient with new onset atrial fibrillation.  The      patient is stable in regards to blood pressure.  He is having no chest      pain or chest discomfort.  He has had no limitation in his activities.      In reviewing the patient's chart, he did have a stress Cardiolite study      performed on October 01, 2004, which was unremarkable with an ejection      fraction of 55%, with no evidence of ischemia.  The patient had a 2D      echocardiogram on October 03, 2005, which showed a normal ejection      fraction of 55-60%, no wall motion abnormalities.  At this point, the      patient does not need any additional evaluation for ischemic heart      disease.  Plan, the patient is started on Coumadin 5 mg every day to      start today.  The patient will follow up in the Coumadin clinic on      May 06, 2006.  The patient is to see Dr. Glennon [name] for      cardiology evaluation and followup.  He has an appointment scheduled      for May 06, 2006 at 10:30 a.m.  The  patient is aware of this      appointment.  7. The patient was provided with a Zostavax at today's visit.  SUMMARY:  A pleasant gentleman who has had another shoe drop with the onset  of atrial  fibrillation, however, I think this will be well managed.  The  plan would be to have him fully anticoagulated.  He will have cardiac  evaluation as noted.  He is a good candidate for attempt at cardioversion.    ______________________________  Rosalyn Gess [name], MD    MEN/MedQ  DD: 04/30/2006  DT: 04/30/2006  Job #: [id number]   cc:   Cecil Cranker, MD, [name]  Blenda Nicely. Shadad  [name]

## 2010-11-09 NOTE — Assessment & Plan Note (Signed)
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  I saw him for  possible atrial fibrillation in November 2007.  We determined at that  time he had frequent PACs but no atrial fibrillation, and moderate  hyperlipidemia.   He has possible bicuspid aortic valve with normal valve excursion and no  gradient.  EF of 55%.   MEDICATIONS:  Advair 250/50 b.i.d.  Nexium 40 mg b.i.d.  Toprol XL 75 mg.  Potassium chloride 20 mEq b.i.d. He does have a history of hypokalemia  but I believe he was on a diuretic at that time.   The patient  is feeling quite well at this time with no cardiac  symptoms.   VITAL SIGNS:  Blood pressure 129/69.  Pulse 72.  Normal sinus rhythm.  GENERAL APPEARANCE:  Normal.  NECK:  JVP is not elevated.  Carotid pulses palpable and equal without  bruits.  LUNGS:  Clear.  CARDIAC EXAM:  Reveals no murmur, gallop, or click.  ABDOMINAL EXAM:  Unremarkable.  EXTREMITIES:  Normal.   IMPRESSION:  Diagnoses as above.  The patient is getting along quite  well.  His recent LDL was 125 and I think he should be on simvastatin 20  mg.  I have also added aspirin 81 mg, decreased the potassium to 20 mEq.  We plan to check his BMP.  He may be able to be off his potassium since  he is not on a diuretic.  Also, I plan to getting a screening abdominal  ultrasound.  I suggested he continue follow up with Dr. Debby Bud and he  can be seen in Cardiology p.r.n. per Dr. Debby Bud.  We will recheck his  lipids and LFTs in 2-3 months.     Cecil Cranker, MD, [facility]  Electronically Signed    EJL/MedQ   DD: 10/15/2006  DT: 10/15/2006  Job #: [id number]

## 2010-11-09 NOTE — Procedures (Signed)
[facility]                              EXERCISE TREADMILL   NAME:[name], [name] [place]                         MRN:          [id number]  DATE:06/11/2006                            DOB:          1928-11-11    The patient has frequent PACs, lymphoma with weight loss, moderate  hyperlipidemia and hypertension.  We decided to do a stress test to see  if there was any evidence of arrhythmia.  At rest, he has frequent PACs  with a heart rate in the 90s.  He exercised through stage 2 of the Bruce  protocol.  The heart rate increased to 134, 110% of target and rate.  He  had less ectopy during exercise.  However, post exercise, he had 3-4  beats runs of SVT and frequent PACs.  He had no ST or T abnormalities.   IMPRESSION:  The patient with clear moderate exercise tolerance with  increase in supraventricular ectopy with short runs of supraventricular  tachycardia post exercise.  There is no history of T abnormality.   I suggest he further decrease his caffeine intake.  I have changed him  from atenolol/HCT 50/25 to Toprol XL 75.  He will see our PA in two  weeks for blood pressure check and pulse check and I will see him back  in two months or p.r.n.  I should note that his TSH recently was normal.     E. Graceann Congress, MD, [facility]  Electronically Signed    EJL/MedQ  DD: 06/11/2006  DT: 06/12/2006  Job #: [id number]   cc:   Rosalyn Gess. [name], MD

## 2010-11-09 NOTE — Assessment & Plan Note (Signed)
[facility]                            CARDIOLOGY OFFICE NOTE   Jack [name], Jack [name]                         MRN:          [id number]  DATE:08/01/2006                            DOB:          Nov 21, 1928    Jack [name] is a very pleasant 77 year old white male with hypertension,  lymphoma, EF of 55% to 60%, normal aorta by abdominal ultrasound, mild  .atherosclerosis common iliac. he has history of SVT post exercise with  brief runs.   He was seen by Herma Carson in January.  Toprol increased to 75.  Patient has been feeling well.  Had no further palpitations.  He is off  most caffeine.   MEDICATIONS:  1. Potassium 20 b.i.d.  2. Toprol 75.  3. Cyclobenzaprine 5 t.i.d.  4. Ambien.  5. Nexium 40.  6. Advair 250/50 b.i.d.   Blood pressure 131/66.  Pulse 78 and regular.  GENERAL APPEARANCE:  Normal.  JVP is not elevated.  Carotid pulses are palpable and equal without  bruits.  LUNGS:  Clear.  CARDIAC EXAM:  Normal.  ABDOMINAL EXAM:  Normal.  EXTREMITIES:  Normal.   DIAGNOSES:  As above.   We plan to continue the same therapy.  We will get a BMP today, and  lipid and LFTs in 3 months, at which time I will see him for followup.     Cecil Cranker, MD, [facility]  Electronically Signed    EJL/MedQ  DD: 08/01/2006  DT: 08/01/2006  Job #: [id number]

## 2010-11-09 NOTE — Assessment & Plan Note (Signed)
[facility]                            CARDIOLOGY OFFICE NOTE   THERON, CUMBIE                         MRN:          [id number]  DATE:06/05/2006                            DOB:          Sep 09, 1928    Jack [name] is a very pleasant, 77 year old, white male with lymphoma,  having completed chemotherapy. He  saw Dr. Debby Bud on November 6 and  there was a question of atrial fibrillation.  The EKG, however, although  read as atrial fibrillation by the computer actually showed normal sinus  rhythm with PACs.  The patient has had no cardiac symptoms.  He has  never had palpitations, shortness of breath, or chest pain.  He does  have a history of hypertension and mild hyperlipidemia.  He had an echo,  October 03, 2005, revealing an EF of 55%-60%.  There was no wall motion  abnormality.  There was a question of a bicuspid valve.   He had a stress adenosine Cardiolite in April 2006 revealing an EF of  55%.  No arrhythmias.  No perfusion defect.   After his last visit, we suggested decreasing the caffeine intake.  He  has decreased his coffee, however, continues to drink soft drinks  including [facility].  Holter monitor has not been done.   MEDICATIONS:  Include:  1. Toprol 50.  2. K-Dur 20 b.i.d. because of low potassium.  3. Advair.  4. Nexium.  5. Ambien.  6. Xifaxan.   Blood pressure 111/62.  Pulse 70 with frequent PACs.  GENERAL APPEARANCE:  Normal.  JVP not elevated.  Carotid pulse is palpable without bruits.  LUNGS:  Clear.  CARDIAC:  Normal except for PACs.  ABDOMEN:  Reveals palpable aorta.  EXTREMITIES:  Normal.   IMPRESSION:  Diagnoses as above.   The patient continues to have frequent PACs.  I cannot make a diagnosis  of atrial fibrillation by history and is not documented.   He has started an exercise program.  I have suggested further reducing  his caffeine intake, particularly by discontinuing the soft drinks.  We  will plan to  have him come for treadmill exercie  study.  Also because of the palpable aorta, we will do a screening  abdominal ultrasound and get a BMP.  I will see him back in 3-4 months.     Cecil Cranker, MD, [facility]  Electronically Signed    EJL/MedQ  DD: 06/05/2006  DT: 06/05/2006  Job #: [id number]

## 2010-11-09 NOTE — Discharge Summary (Signed)
[facility]  Patient:    Jack [name], Jack [name]                         MRN: [id number] Adm. Date:  [id number] Disc. Date: [id number] Attending:  Dellia Nims CC:         Rosalyn Gess. [name], M.D. [employer]   Discharge Summary  DISCHARGE DIAGNOSES: 1. Right foot cellulitis. 2. Tenia pedis. 3. Chronic obstructive pulmonary disease. 4. Gastroesophageal reflux disease. 5. New onset left foot pain (possible gout). 6. Status post herniorrhaphy. 7. Status post left rotator cuff surgery. 8. History of tobacco abuse.  DISCHARGE MEDICATIONS: 1. Pulmicort one puff q.d. 2. Nexium 40 mg p.o. q.d. 3. Combivent two puffs q.i.d. p.r.n. 4. Monistat dermatologic cream b.i.d. between toes. 5. Keflex 500 mg p.o. q.i.d. 6. Indocin 75 mg p.o. t.i.d. p.r.n.  HOSPITAL PROCEDURE:  X-ray of the right foot demonstrated no acute bony abnormality.  There were degenerative changes in hallux valgus of the first MTP joint.  HOSPITAL LABORATORY DATA:  Admission CBC was normal with a white blood cell count of 10.3.  Next day CBC was also normal with white blood cell count of 9.5.  BMET was normal on July 08, 2000.  CONDITION ON DISCHARGE:  Improved.  FOLLOWUP PLANS:  With Dr. Debby Bud in 2-3 weeks.  HOSPITAL COURSE:  The patient was admitted to the hospital service on July 06, 2000.  See Dr. Baldo Daub note for details.  Briefly, the patient was admitted with right foot cellulitis.  He was treated aggressively with IV antibiotics.  He was then changed to p.o. antibiotics.  Cellulitis improved during his hospital stay.  The patient was ambulating at the time of discharge.  On the day of discharge, the patient noted left MTP joint pain.  On examination, there was no significant swelling, was markedly tender over that area.  He had full range of motion and he was able to ambulate.  This was discussed with Mr. Odis Luster.  It is possible that he has gout.  We will treat with Indocin  empirically and have him followup with Dr. Debby Bud. DD:  07/10/00 TD:  07/11/00 Job: [id number]

## 2010-11-09 NOTE — Op Note (Signed)
NAMEJAMEEK, Jack [name]                  ACCOUNT NO.:  [id number]   MEDICAL RECORD NO.:  [id number]          PATIENT TYPE:  AMB   LOCATION:  DSC                          FACILITY:  [facility]   PHYSICIAN:  Christopher E. Ezzard Standing, M.D.DATE OF BIRTH:  12/08/1928   DATE OF PROCEDURE:  09/06/2005  DATE OF DISCHARGE:                                 OPERATIVE REPORT   PREOPERATIVE DIAGNOSIS:  Enlarged right submandibular neck node.   POSTOPERATIVE DIAGNOSIS:  Enlarged right submandibular neck node.   OPERATION PERFORMED:  Direct laryngoscopy and excisional biopsy of right  submandibular neck node.   SURGEON:  Kristine Garbe. Ezzard Standing, M.D.   ANESTHESIA:  General endotracheal.   COMPLICATIONS:  None.   INDICATIONS FOR PROCEDURE:  Jack [name] is a 77 year old gentleman who has  history of heavy alcohol use.  He has had an enlarged right submandibular  neck node for a little over five weeks.  On examination, he has an  approximately 2 cm firm submandibular node just anterior to the angle of the  mandible.  Remaining oral exam is clear.  He is taken to the operating room  at this time for direct laryngoscopy and excisional biopsy of the right  submandibular node because of suspicion for possible neoplastic process.   DESCRIPTION OF PROCEDURE:  After adequate endotracheal anesthesia, first  direct laryngoscopy was performed.  The floor of mouth, tongue was normal to  evaluation, was soft to palpation, tonsil area appeared benign.  Base of  tongue, vallecula and epiglottis were all normal.  Both AE folds and  piriform sinuses were clear.  False and true cords were normal and laryngeal  surface of the epiglottis was clear.  This completed the direct  laryngoscopy.  Following this, the right neck was prepped with Betadine  solution and draped out with sterile towels.  The proposed incision site for  removal of the right submandibular node was marked just inferior to the  node.  Incision was made through  the skin, through the platysmas muscle.  A  branch or the marginal nerve was identified lying just on the superior  aspect of the node.  This was carefully dissected off of the node and  retracted superiorly.  The node was then dissected out being careful to stay  just adjacent to the node as the node was in the vicinity of the marginal  branch of the facial nerve.  The inferior attachments of the node were  clamped, ligated with 3-0 silk suture.  The node was removed and sent to  pathology in saline.  Frozen section was obtained to rule out cancer.  After  obtaining adequate hemostasis, defect was closed with 3-0 chromic sutures to  reapproximate the platysmas muscle and subcutaneous tissue and a 5-0 nylon  to reapproximate the skin edges.  Steri-Strips were applied.  Dressings were  applied.  Pathology report on the frozen section revealed suspicion for  possible lymphoma.  No evidence of a carcinoma.  The procedure was  completed.  The patient was awakened from anesthesia and transferred to  recovery room postoperatively doing well.  DISPOSITION:  Jack [name] is discharged home later this morning on Tylenol and  Tylenol #3 p.r.n. pain.  Will continue with his regular medications and have  him follow up in my office in five to six days to review pathology.           ______________________________  Kristine Garbe Ezzard Standing, M.D.     CEN/MEDQ  D:  09/06/2005  T:  09/07/2005  Job:  [id number]   cc:   Rosalyn Gess. [name], M.D. [facility]  [address]  [place]  [place] [address]

## 2010-11-29 ENCOUNTER — Encounter: Payer: Self-pay | Admitting: Internal Medicine

## 2010-11-30 ENCOUNTER — Encounter: Payer: Self-pay | Admitting: Internal Medicine

## 2010-11-30 ENCOUNTER — Other Ambulatory Visit (INDEPENDENT_AMBULATORY_CARE_PROVIDER_SITE_OTHER): Payer: Medicare Other

## 2010-11-30 ENCOUNTER — Ambulatory Visit (INDEPENDENT_AMBULATORY_CARE_PROVIDER_SITE_OTHER): Payer: Medicare Other | Admitting: Internal Medicine

## 2010-11-30 ENCOUNTER — Telehealth: Payer: Self-pay | Admitting: *Deleted

## 2010-11-30 VITALS — BP 128/74 | HR 66 | Temp 97.5°F | Wt 197.0 lb

## 2010-11-30 DIAGNOSIS — Z136 Encounter for screening for cardiovascular disorders: Secondary | ICD-10-CM

## 2010-11-30 DIAGNOSIS — K219 Gastro-esophageal reflux disease without esophagitis: Secondary | ICD-10-CM

## 2010-11-30 DIAGNOSIS — R252 Cramp and spasm: Secondary | ICD-10-CM

## 2010-11-30 DIAGNOSIS — E785 Hyperlipidemia, unspecified: Secondary | ICD-10-CM

## 2010-11-30 DIAGNOSIS — K7030 Alcoholic cirrhosis of liver without ascites: Secondary | ICD-10-CM

## 2010-11-30 DIAGNOSIS — C8589 Other specified types of non-Hodgkin lymphoma, extranodal and solid organ sites: Secondary | ICD-10-CM

## 2010-11-30 DIAGNOSIS — Z0000 Encounter for general adult medical examination without abnormal findings: Secondary | ICD-10-CM

## 2010-11-30 DIAGNOSIS — Z23 Encounter for immunization: Secondary | ICD-10-CM

## 2010-11-30 DIAGNOSIS — J438 Other emphysema: Secondary | ICD-10-CM

## 2010-11-30 LAB — COMPREHENSIVE METABOLIC PANEL
ALT: 32 U/L (ref 0–53)
Albumin: 4.2 g/dL (ref 3.5–5.2)
Alkaline Phosphatase: 58 U/L (ref 39–117)
CO2: 31 mEq/L (ref 19–32)
GFR: 89.86 mL/min (ref >60.00)
Glucose, Bld: 120 mg/dL — ABNORMAL HIGH (ref 70–99)
Potassium: 4.7 mEq/L (ref 3.5–5.1)
Sodium: 139 mEq/L (ref 135–145)
Total Bilirubin: 0.6 mg/dL (ref 0.3–1.2)
Total Protein: 7 g/dL (ref 6.0–8.3)

## 2010-11-30 LAB — CBC WITH DIFFERENTIAL/PLATELET
Basophils Absolute: 0 10*3/uL (ref 0.0–0.1)
Basophils Relative: 0.4 % (ref 0.0–3.0)
Eosinophils Absolute: 0.2 10*3/uL (ref 0.0–0.7)
HCT: 40 % (ref 39.0–52.0)
Hemoglobin: 13.7 g/dL (ref 13.0–17.0)
Lymphocytes Relative: 29 % (ref 12.0–46.0)
Lymphs Abs: 1.9 10*3/uL (ref 0.7–4.0)
MCHC: 34.2 g/dL (ref 30.0–36.0)
MCV: 95 fl (ref 78.0–100.0)
Neutro Abs: 3.8 10*3/uL (ref 1.4–7.7)
RBC: 4.21 Mil/uL — ABNORMAL LOW (ref 4.22–5.81)
RDW: 14.2 % (ref 11.5–14.6)

## 2010-11-30 LAB — HEPATIC FUNCTION PANEL
AST: 37 U/L (ref 0–37)
Albumin: 4.2 g/dL (ref 3.5–5.2)
Alkaline Phosphatase: 58 U/L (ref 39–117)
Total Protein: 7 g/dL (ref 6.0–8.3)

## 2010-11-30 LAB — LIPID PANEL
HDL: 67 mg/dL (ref >39.00)
Total CHOL/HDL Ratio: 2

## 2010-11-30 MED ORDER — CIPROFLOXACIN HCL 500 MG PO TABS: 500.0000 mg | ORAL_TABLET | Freq: Two times a day (BID) | ORAL | Status: DC

## 2010-11-30 MED ORDER — ESOMEPRAZOLE MAGNESIUM 40 MG PO CPDR: 40.0000 mg | DELAYED_RELEASE_CAPSULE | Freq: Every day | ORAL | Status: DC

## 2010-11-30 MED ORDER — SILDENAFIL CITRATE 100 MG PO TABS: 100.0000 mg | ORAL_TABLET | ORAL | Status: DC | PRN

## 2010-11-30 MED ORDER — CELECOXIB 100 MG PO CAPS: 100.0000 mg | ORAL_CAPSULE | Freq: Two times a day (BID) | ORAL | Status: DC

## 2010-11-30 MED ORDER — FLUTICASONE-SALMETEROL 250-50 MCG/DOSE IN AEPB: 1.0000 | INHALATION_SPRAY | Freq: Two times a day (BID) | RESPIRATORY_TRACT | Status: DC

## 2010-11-30 MED ORDER — METOPROLOL SUCCINATE ER 50 MG PO TB24: 75.0000 mg | ORAL_TABLET | Freq: Every day | ORAL | Status: DC

## 2010-11-30 MED ORDER — ESZOPICLONE 2 MG PO TABS: 2.0000 mg | ORAL_TABLET | Freq: Every day | ORAL | Status: DC

## 2010-11-30 MED ORDER — METOPROLOL SUCCINATE ER 50 MG PO TB24: 50.0000 mg | ORAL_TABLET | Freq: Every day | ORAL | Status: DC

## 2010-11-30 MED ORDER — GABAPENTIN 300 MG PO CAPS: 300.0000 mg | ORAL_CAPSULE | Freq: Two times a day (BID) | ORAL | Status: DC

## 2010-11-30 MED ORDER — ZOSTER VACCINE LIVE 19400 UNT/0.65ML ~~LOC~~ SOLR: 0.6500 mL | Freq: Once | SUBCUTANEOUS | Status: DC

## 2010-11-30 MED ORDER — SIMVASTATIN 20 MG PO TABS: 20.0000 mg | ORAL_TABLET | Freq: Every day | ORAL | Status: DC

## 2010-11-30 NOTE — Progress Notes (Signed)
Subjective:    Patient ID: Jack [name], male    DOB: October 18, 1928, 77 y.o.   MRN: [id number]  HPI  The patient is here for annual Medicare wellness examination and management of other chronic and acute problems. He has been doing well. He has been traveling. He has had no major illness, surgery or injury. He has been doing very well with his hearing aids: a  Better system. He has done well with his lymphoma and is scheduled for PET scan in September. He does have an on-going problem with leg cramps.    The risk factors are reflected in the social history.  The roster of all physicians providing medical care to patient - is listed in the Snapshot section of the chart.  Activities of daily living:  The patient is 100% inedpendent in all ADLs: dressing, toileting, feeding as well as independent mobility  Home safety : The patient has smoke detectors in the home. They wear seatbelts.No firearms at home. There is no violence in the home.   There is no risks for hepatitis, STDs or HIV. There is no   history of blood transfusion. They have no travel history to infectious disease endemic areas of the world.  The patient has seen their dentist in the last six month. They have seen their eye doctor in the last year. They admit to any hearing difficulty and have  had audiologic testing in the last year.  They do  have excessive sun exposure. Discussed the need for sun protection: hats, long sleeves and use of sunscreen if there is significant sun exposure.   Diet: the importance of a healthy diet is discussed. They do have a healthy (unhealthy-high fat/fast food) diet.  The patient has a regular exercise program:  , 30 minute duration, 2 times per week.  The benefits of regular aerobic exercise were discussed.  Depression screen: there are no signs or vegative symptoms of depression- irritability, change in appetite, anhedonia, sadness/tearfullness.  Cognitive assessment: the patient manages all their  financial and personal affairs and is actively engaged. They could relate day,date,year and events; recalled 3/3 objects at 3 minutes; performed clock-face test normally.  The following portions of the patient's history were reviewed and updated as appropriate: allergies, current medications, past family history, past medical history,  past surgical history, past social history  and problem list.  Vision, hearing, body mass index were assessed and reviewed.   During the course of the visit the patient was educated and counseled about appropriate screening and preventive services including : fall prevention , diabetes screening, nutrition counseling, colorectal cancer screening, and recommended immunizations.   Past Medical History  Diagnosis Date  . GERD (gastroesophageal reflux disease)   . Osteoarthritis   . Alcohol abuse     in recovery 2006  . Cirrhosis with alcoholism   . Irritable bowel   . Non Hodgkin's lymphoma     in remission  . Hyperlipidemia   . Insomnia   . Emphysema   . Carpal tunnel syndrome   . PAC (premature atrial contraction)     stable (ruled out for a fib)  . Prostatitis     chronic bacterial  . Pseudogout   . Sinus problem     chronic  . Cellulitis     2nd to T. Pedis  . Hearing loss    Past Surgical History  Procedure Date  . Esophagogastroduodenoscopy 01-27-02  . Inguinal hernia repair 1979  . Torn biceps repair  Family History  Problem Relation Age of Onset  . Coronary artery disease Father   . Heart attack Father   . Cancer Brother     colon  . Other Other     TB   History   Social History  . Marital Status: Widowed    Spouse Name: N/A    Number of Children: 3  . Years of Education: 18   Occupational History  . newspaper Programmer, multimedia     [employer]   Social History Main Topics  . Smoking status: Former Smoker    Types: Cigarettes    Quit date: 06/24/1978  . Smokeless tobacco: Never Used  . Alcohol Use: 2.5 oz/week    5 drink(s) per  week     Currently only drinks wine - in moderation. He is cutting back  . Drug Use: No  . Sexually Active: Not on file   Other Topics Concern  . Not on file   Social History Narrative   [employer] [place]. married '56- widowed '07. 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 lives alone: completed  [place] continues to [place] improvement ('12), keeps up his beach house; takes his extended family on great trips - [place] and [place] July '12. Socially active.  Active in AAEnd of Life issues: He does want CPR; no prolonged intubation; no futile or heroic measure to maintain him if the quality of life isn't good.       Review of Systems Review of Systems  Constitutional:  Negative for fever, chills, activity change and unexpected weight change.  HENT:  Negative ear pain, congestion, neck stiffness and postnasal drip.   Eyes: Negative for pain, discharge and visual disturbance.  Respiratory: Negative for chest tightness and wheezing.   Cardiovascular: Negative for chest pain and palpitations.       No decreased exercise tolerance Gastrointestinal: No change in bowel habit. No bloating or gas. No reflux or indigestion Genitourinary: Negative for urgency, flank pain and difficulty urinating. He does have daytime frequency, nocturia x 1.  Musculoskeletal: Negative for myalgias, back pain, arthralgias and gait problem. Some left knee pain with overuse.  Neurological: Negative for dizziness, weakness and headaches.  Hematological: Negative for adenopathy.  Psychiatric/Behavioral: Negative for behavioral problems and dysphoric mood.       Objective:   Physical Exam Vital signs reviewed Gen'l: Well nourished well developed white male in no acute distress  HENT:  Head: Normocephalic and atraumatic.  Right Ear: External ear normal. EAC/TM nl Left Ear: External ear normal.  EAC/TM nl Nose: Nose  normal.  Mouth/Throat: Oropharynx is clear and moist. Dentition - native, in good repair. No buccal or palatal lesions. Posterior pharynx clear. Eyes: Conjunctivae and sclera clear. EOM intact. Pupils are equal, round, and reactive to light. Right eye exhibits no discharge. Left eye exhibits no discharge. Neck: Normal range of motion. Neck supple. No JVD present. No tracheal deviation present. No thyromegaly present.  Cardiovascular: Normal rate, regular rhythm, no gallop, no friction rub, no murmur heard.      Quiet precordium. 2+ radial and DP pulses . No carotid bruits Pulmonary/Chest: Effort normal. No respiratory distress or increased WOB, no wheezes, no rales. No chest wall deformity or CVAT. Abdominal: Soft. Bowel sounds are normal in all quadrants. He exhibits no distension, no tenderness, no rebound or guarding, No heptosplenomegaly. Small umbilical hernia.  Genitourinary:  deferred to urology Musculoskeletal: Normal range of motion. He exhibits  no edema and no tenderness.       Small and large joints without redness, synovial thickening or deformity. Full range of motion preserved about all small, median and large joints.  Lymphadenopathy:    He has no cervical or supraclavicular adenopathy.  Neurological: He is alert and oriented to person, place, and time. CN II-XII intact. DTRs 2+ and symmetrical biceps, radial and patellar tendons. Cerebellar function normal with no tremor, rigidity, normal gait and station.  Skin: Skin is warm and dry. No rash noted. No erythema.  Psychiatric: He has a normal mood and affect. His behavior is normal. Thought content normal.   Lab Results  Component Value Date   WBC 6.6 11/30/2010   HGB 13.7 11/30/2010   HCT 40.0 11/30/2010   PLT 178.0 11/30/2010   CHOL 166 11/30/2010   TRIG 100.0 11/30/2010   HDL 67.00 11/30/2010   LDLDIRECT 86.5 10/21/2007   ALT 32 11/30/2010   ALT 32 11/30/2010   AST 37 11/30/2010   AST 37 11/30/2010   NA 139 11/30/2010   K 4.7 11/30/2010   CL  103 11/30/2010   CREATININE 0.9 11/30/2010   BUN 20 11/30/2010   CO2 31 11/30/2010   TSH 0.65 11/30/2010   PSA 1.88 12/08/2009   INR 0.8 RATIO* 01/27/2007           Assessment & Plan:

## 2010-11-30 NOTE — Telephone Encounter (Signed)
Corrected toprol qty and directions. We had 1 qd and 1 and 1/2 qd. Rx sent in was for # 30 and pharm called for clarification. I asked pt, he takes 1 and 1/2 qd. Corrected RX an qty

## 2010-12-01 ENCOUNTER — Encounter: Payer: Self-pay | Admitting: Internal Medicine

## 2010-12-01 NOTE — Assessment & Plan Note (Signed)
Lipid panel reveals good control: he is close to goal of an LDL 80 or less.  Plan - continue present medical regimen           Better diet control

## 2010-12-01 NOTE — Assessment & Plan Note (Signed)
In durable remission. He is too have a PET scan in September.

## 2010-12-01 NOTE — Assessment & Plan Note (Signed)
Interval history is unremarkable and good. Physical exam is normal. Lab results are within normal limits. He will see urology for Prostate follow-up. He is current with colorectal cancer screening with last study  Nov '06. Immunizations: Tetanu June '10, pneumonia vaccine June '10; shingles vaccine today. 12 lead EKG with sinus bradycardia at 59 otherwise normal with no signs of ischemia or injury.  In summary - a very nice man who is medically stable and doing well. He is thankful for his good fortune and is enjoying his extended family. He is provided with refills on his medications, a Rx for cipro for his travel kit. He will return as needed or in 1 year for follow-up.

## 2010-12-01 NOTE — Assessment & Plan Note (Signed)
Liver functions are normal. Exam is unremarkable with no RUQ tenderness or palpable liver edge. He does continue to drink wine but is cutting back.  Plan - continue with AA           Continue to reduce alcohol consumption

## 2010-12-01 NOTE — Assessment & Plan Note (Signed)
This is an on-going problem that is very bothersome. He is using neurontin, ginko, otc  quinine product and a TENS unit.  Plan - it is ok to take quinine on a regular daily basis to reduce the incidence of leg cramps.           After 4-6 weeks of daily use he is asked to come to the office for a follow-up EKG to rule out any quinine effect.

## 2010-12-01 NOTE — Assessment & Plan Note (Signed)
He has no active complaint of symptoms. He continues on PPI therapy with good results.

## 2010-12-01 NOTE — Assessment & Plan Note (Signed)
No respiratory symptoms. He is able to carry out all his normal activities. He does work out on a regular basis. He has no chronic cough.

## 2010-12-10 ENCOUNTER — Other Ambulatory Visit: Payer: Self-pay | Admitting: Internal Medicine

## 2011-02-14 ENCOUNTER — Other Ambulatory Visit: Payer: Self-pay | Admitting: Internal Medicine

## 2011-02-27 ENCOUNTER — Ambulatory Visit (INDEPENDENT_AMBULATORY_CARE_PROVIDER_SITE_OTHER): Payer: Medicare Other | Admitting: Internal Medicine

## 2011-02-27 ENCOUNTER — Encounter: Payer: Self-pay | Admitting: Internal Medicine

## 2011-02-27 DIAGNOSIS — W1809XA Striking against other object with subsequent fall, initial encounter: Secondary | ICD-10-CM

## 2011-02-27 DIAGNOSIS — K7030 Alcoholic cirrhosis of liver without ascites: Secondary | ICD-10-CM

## 2011-02-27 DIAGNOSIS — W1800XA Striking against unspecified object with subsequent fall, initial encounter: Secondary | ICD-10-CM

## 2011-02-27 NOTE — Progress Notes (Signed)
  Subjective:    Patient ID: Jack [name], male    DOB: 1929/04/14, 77 y.o.   MRN: [id number]  HPI Jack [name] last Friday had an accident where he walked into a glass wall. He was taken to [facility] 4:30 AM Saturday, Sept 1. He had CT brain and EKG and routine evaluation that was evidently negative. He had dermabond gluing of a wound to the occiput. He has no persistent symptoms  I have reviewed the patient's medical history in detail and updated the computerized patient record.    Review of Systems System review is negative for any constitutional, cardiac, pulmonary, GI or neuro symptoms or complaints        Objective:   Physical Exam Vitals - ok Gen'l - WNWD white male in no distress. HEENT- small lac occiput that is closed and with no erythema or signs of infection Cor- RRR Resp- unlabored       Assessment & Plan:  Fall - no lasting injury. Have request labs and CT scan report from outlying hospital

## 2011-03-02 NOTE — Assessment & Plan Note (Signed)
Alcohol remains a problem. He has been attending AA and serving as a sponsor. However, although he doesn't drink spirits he has continued to drink wine and this last incident was related to excessive consumption of champagne. He is aware of this as a problem and will need to pick up a chip at AA mtg.

## 2011-03-05 ENCOUNTER — Encounter (HOSPITAL_BASED_OUTPATIENT_CLINIC_OR_DEPARTMENT_OTHER): Payer: Medicare Other | Admitting: Oncology

## 2011-03-05 ENCOUNTER — Encounter (HOSPITAL_COMMUNITY)
Admission: RE | Admit: 2011-03-05 | Discharge: 2011-03-05 | Disposition: A | Discharge status: Home / Self Care | Payer: Medicare Other | Source: Ambulatory Visit | Attending: Oncology | Admitting: Oncology

## 2011-03-05 ENCOUNTER — Other Ambulatory Visit: Payer: Self-pay | Admitting: Oncology

## 2011-03-05 ENCOUNTER — Other Ambulatory Visit: Payer: Self-pay | Admitting: Internal Medicine

## 2011-03-05 DIAGNOSIS — C8299 Follicular lymphoma, unspecified, extranodal and solid organ sites: Secondary | ICD-10-CM

## 2011-03-05 DIAGNOSIS — Q619 Cystic kidney disease, unspecified: Secondary | ICD-10-CM | POA: Insufficient documentation

## 2011-03-05 DIAGNOSIS — K5730 Diverticulosis of large intestine without perforation or abscess without bleeding: Secondary | ICD-10-CM | POA: Insufficient documentation

## 2011-03-05 DIAGNOSIS — I2510 Atherosclerotic heart disease of native coronary artery without angina pectoris: Secondary | ICD-10-CM | POA: Insufficient documentation

## 2011-03-05 DIAGNOSIS — C8590 Non-Hodgkin lymphoma, unspecified, unspecified site: Secondary | ICD-10-CM

## 2011-03-05 DIAGNOSIS — K7689 Other specified diseases of liver: Secondary | ICD-10-CM | POA: Insufficient documentation

## 2011-03-05 DIAGNOSIS — N400 Enlarged prostate without lower urinary tract symptoms: Secondary | ICD-10-CM | POA: Insufficient documentation

## 2011-03-05 DIAGNOSIS — C8589 Other specified types of non-Hodgkin lymphoma, extranodal and solid organ sites: Secondary | ICD-10-CM | POA: Insufficient documentation

## 2011-03-05 LAB — CBC WITH DIFFERENTIAL/PLATELET
BASO%: 0.8 % (ref 0.0–2.0)
Basophils Absolute: 0 10*3/uL (ref 0.0–0.1)
Eosinophils Absolute: 0.2 10*3/uL (ref 0.0–0.5)
HCT: 39.9 % (ref 38.4–49.9)
HGB: 13.7 g/dL (ref 13.0–17.1)
LYMPH%: 35.4 % (ref 14.0–49.0)
MCHC: 34.4 g/dL (ref 32.0–36.0)
MONO#: 0.6 10*3/uL (ref 0.1–0.9)
NEUT#: 2.9 10*3/uL (ref 1.5–6.5)
NEUT%: 49 % (ref 39.0–75.0)
Platelets: 157 10*3/uL (ref 140–400)
WBC: 5.9 10*3/uL (ref 4.0–10.3)
lymph#: 2.1 10*3/uL (ref 0.9–3.3)

## 2011-03-05 LAB — COMPREHENSIVE METABOLIC PANEL
AST: 26 U/L (ref 0–37)
Albumin: 4.4 g/dL (ref 3.5–5.2)
BUN: 21 mg/dL (ref 6–23)
Calcium: 9.4 mg/dL (ref 8.4–10.5)
Chloride: 102 mEq/L (ref 96–112)
Glucose, Bld: 125 mg/dL — ABNORMAL HIGH (ref 70–99)
Potassium: 4.5 mEq/L (ref 3.5–5.3)
Sodium: 139 mEq/L (ref 135–145)
Total Protein: 6.5 g/dL (ref 6.0–8.3)

## 2011-03-05 MED ORDER — FLUDEOXYGLUCOSE F - 18 (FDG) INJECTION
18.2000 | Freq: Once | INTRAVENOUS | Status: AC | PRN
  Administered 2011-03-05: 18.2 via INTRAVENOUS

## 2011-03-07 ENCOUNTER — Encounter (HOSPITAL_BASED_OUTPATIENT_CLINIC_OR_DEPARTMENT_OTHER): Payer: Medicare Other | Admitting: Oncology

## 2011-03-07 DIAGNOSIS — C8299 Follicular lymphoma, unspecified, extranodal and solid organ sites: Secondary | ICD-10-CM

## 2011-03-27 LAB — GLUCOSE, CAPILLARY: Glucose-Capillary: 129 — ABNORMAL HIGH

## 2011-04-05 ENCOUNTER — Telehealth: Payer: Self-pay | Admitting: Internal Medicine

## 2011-04-05 NOTE — Telephone Encounter (Signed)
Received copies from [facility],on 04/05/2011. Forwarded  1page to Dr. Reva Bores review.

## 2011-04-23 ENCOUNTER — Other Ambulatory Visit: Payer: Self-pay | Admitting: Internal Medicine

## 2011-04-24 NOTE — Telephone Encounter (Signed)
Please advise regarding RF 

## 2011-04-26 NOTE — Telephone Encounter (Signed)
Ok for refill  x3 

## 2011-05-19 ENCOUNTER — Other Ambulatory Visit: Payer: Self-pay | Admitting: Internal Medicine

## 2011-06-20 ENCOUNTER — Telehealth: Payer: Self-pay | Admitting: *Deleted

## 2011-06-20 MED ORDER — ESZOPICLONE 2 MG PO TABS: 2.0000 mg | ORAL_TABLET | Freq: Every day | ORAL | Status: DC

## 2011-06-20 NOTE — Telephone Encounter (Signed)
Ok x 5 

## 2011-06-20 NOTE — Telephone Encounter (Signed)
Refill request for Lunesta 2 mg take 1 tablet by mouth at bedtime , please Advise refills

## 2011-07-08 ENCOUNTER — Ambulatory Visit (INDEPENDENT_AMBULATORY_CARE_PROVIDER_SITE_OTHER): Payer: Medicare Other | Admitting: Internal Medicine

## 2011-07-08 DIAGNOSIS — G500 Trigeminal neuralgia: Secondary | ICD-10-CM

## 2011-07-08 DIAGNOSIS — M6580 Other synovitis and tenosynovitis, unspecified site: Secondary | ICD-10-CM

## 2011-07-08 DIAGNOSIS — Z23 Encounter for immunization: Secondary | ICD-10-CM

## 2011-07-08 DIAGNOSIS — M76892 Other specified enthesopathies of left lower limb, excluding foot: Secondary | ICD-10-CM

## 2011-07-08 NOTE — Patient Instructions (Signed)
Lancinating pain at the angle of the jaw right sounds like acute trigeminal neuralgia of the vestibulo-parietal branch. No treatment except to pray that it doesn't come back.   Inflammation of the insertion of the sartorius muscle right knee. Treated with depo medrol - a long acting steroid. May resume exercise but don't overdo it.

## 2011-07-08 NOTE — Progress Notes (Signed)
  Subjective:    Patient ID: Jack [name], male    DOB: 1929-04-05, 78 y.o.   MRN: [id number]  HPI Jack [name] had an episode of very sharp pain just at the back of the jaw right - lasted for less than a minute. Has not recurred. Was seen by EMS  - he declined to go to ED for evaluation. He did go to Urgent Care - negative evaluation, may have a pinched nerve - trigeminal neuralgia.  He has had 10 days of pain at the medial knee over the tibia at the insertion of the sartorius.   Review of Systems     Objective:   Physical Exam .vcitals Gen'l - WNWD white man in no distress HEENT - no tenderness at the TMJ right but there is some slight crepitus. Pulm - normal respirations Cor - RRR MSK tender at the medial knee - tibial insertion point of the sartorius.  Procedure Joint/bursal injection  Indication - localized pain medial left knee at tibial insertion of sartorius Consent - informed verbal consent from patient after explanation of risks of bleeding and infection Prep - injection site identified, prepped with betadine followed by alcohol. Med -  40 Mg depomedrol with 0.5 Cc 2% xylocain Injection - bursa/trigger point space entered easily. Injected without difficulty. Patient tolerated this well. Post-procedure - patient with rapid reduction in discomfort. Bandaid applied. Routine precautions provided including instruction to return for fever, drainage or increased pain        Assessment & Plan:  1.Tendonitis - medial left knee  Rapid relief with steroid injection.  2. Acute right jaw pain - trigeminal nerve irritation  No treatment needed. Reassured - not cardiac, not TMJ

## 2011-08-06 ENCOUNTER — Other Ambulatory Visit: Payer: Self-pay | Admitting: Internal Medicine

## 2011-08-06 DIAGNOSIS — M25569 Pain in unspecified knee: Secondary | ICD-10-CM

## 2011-08-09 ENCOUNTER — Other Ambulatory Visit: Payer: Self-pay | Admitting: Internal Medicine

## 2011-08-13 NOTE — Telephone Encounter (Signed)
Cialis request [last refill 06.08.12 #10x11] Please advise.

## 2011-08-15 ENCOUNTER — Other Ambulatory Visit: Payer: Self-pay | Admitting: Internal Medicine

## 2011-08-15 NOTE — Telephone Encounter (Signed)
Verified receipt of Cialis rx on 08/09/11 by Jack [name] at [facility]. She will delete request. Refill sent for triamcinolone.

## 2011-08-23 ENCOUNTER — Other Ambulatory Visit: Payer: Self-pay | Admitting: *Deleted

## 2011-08-23 NOTE — Telephone Encounter (Signed)
oki for refill x 5

## 2011-08-23 NOTE — Telephone Encounter (Signed)
Temazepam request [last refilled 08.23.12 #30x5]

## 2011-08-26 MED ORDER — TEMAZEPAM 30 MG PO CAPS: ORAL_CAPSULE | ORAL | Status: DC

## 2011-08-26 NOTE — Telephone Encounter (Signed)
Done via phone

## 2011-10-07 DIAGNOSIS — M1120 Other chondrocalcinosis, unspecified site: Secondary | ICD-10-CM | POA: Insufficient documentation

## 2011-10-07 DIAGNOSIS — G4700 Insomnia, unspecified: Secondary | ICD-10-CM | POA: Insufficient documentation

## 2011-10-08 ENCOUNTER — Ambulatory Visit (INDEPENDENT_AMBULATORY_CARE_PROVIDER_SITE_OTHER): Payer: Medicare Other | Admitting: Internal Medicine

## 2011-10-08 ENCOUNTER — Encounter: Payer: Self-pay | Admitting: Internal Medicine

## 2011-10-08 VITALS — BP 110/68 | HR 85 | Temp 99.6°F | Ht 71.0 in | Wt 199.2 lb

## 2011-10-08 DIAGNOSIS — M25569 Pain in unspecified knee: Secondary | ICD-10-CM

## 2011-10-08 DIAGNOSIS — M1712 Unilateral primary osteoarthritis, left knee: Secondary | ICD-10-CM | POA: Insufficient documentation

## 2011-10-08 MED ORDER — METHYLPREDNISOLONE ACETATE 80 MG/ML IJ SUSP
120.0000 mg | Freq: Once | INTRAMUSCULAR | Status: AC
  Administered 2011-10-08: 120 mg via INTRAMUSCULAR

## 2011-10-08 MED ORDER — PREDNISONE 10 MG PO TABS: 10.0000 mg | ORAL_TABLET | Freq: Every day | ORAL | Status: DC

## 2011-10-08 MED ORDER — METHYLPREDNISOLONE ACETATE 80 MG/ML IJ SUSP: 120.0000 mg | Freq: Once | INTRAMUSCULAR | Status: DC

## 2011-10-08 MED ORDER — HYDROCODONE-ACETAMINOPHEN 5-325 MG PO TABS: 1.0000 | ORAL_TABLET | Freq: Four times a day (QID) | ORAL | Status: AC | PRN

## 2011-10-08 NOTE — Progress Notes (Signed)
Subjective:    Patient ID: Jack [name], male    DOB: [birth year], 78 y.o.   MRN: [id number]  HPI  Here to f/u with acute onset 2 days sudden left medial knee pain, swelling wtihout trauma, fever, giveaway, click or prior films/mri/ortho eval.  Had cortisone intrarticular earlier this yr that worked well and no further pain until yesterday.  Wants to avoid ortho for now if possible. Has hx of OA, and pseudogout by chart.   Past Medical History  Diagnosis Date  . GERD (gastroesophageal reflux disease)   . Osteoarthritis   . Alcohol abuse     in recovery 2006  . Cirrhosis with alcoholism   . Irritable bowel   . Non Hodgkin's lymphoma     in remission  . Hyperlipidemia   . Insomnia   . Emphysema   . Carpal tunnel syndrome   . PAC (premature atrial contraction)     stable (ruled out for a fib)  . Prostatitis     chronic bacterial  . Pseudogout   . Sinus problem     chronic  . Cellulitis     2nd to T. Pedis  . Hearing loss    Past Surgical History  Procedure Date  . Esophagogastroduodenoscopy 01-27-02  . Inguinal hernia repair 1979  . Torn biceps repair     reports that he quit smoking about 33 years ago. His smoking use included Cigarettes. He has never used smokeless tobacco. He reports that he drinks about 2.5 ounces of alcohol per week. He reports that he does not use illicit drugs. family history includes Cancer in his brother; Coronary artery disease in his father; Heart attack in his father; and Other in his other. Allergies  Allergen Reactions  . Amoxicillin     REACTION: rash  . Penicillins    Current Outpatient Prescriptions on File Prior to Visit  Medication Sig Dispense Refill  . aspirin 81 MG tablet Take 81 mg by mouth daily.        . celecoxib (CELEBREX) 100 MG capsule Take 1 capsule (100 mg total) by mouth 2 (two) times daily.  60 capsule  11  . CIALIS 20 MG tablet take 1 tablet by mouth if needed  3 tablet  5  . ciprofloxacin (CIPRO) 500 MG tablet Take 1  tablet (500 mg total) by mouth 2 (two) times daily.  14 tablet  1  . diphenoxylate-atropine (LOMOTIL) 2.5-0.025 MG per tablet take 1 tablet by mouth AFTER EACH LOOSE STOOL,MAX OF 4 TABLETS A DAY  20 tablet  3  . esomeprazole (NEXIUM) 40 MG capsule Take 1 capsule (40 mg total) by mouth daily before breakfast.  30 capsule  11  . eszopiclone (LUNESTA) 2 MG TABS Take 1 tablet (2 mg total) by mouth at bedtime. Take immediately before bedtime  30 tablet  5  . Fluticasone-Salmeterol (ADVAIR DISKUS) 250-50 MCG/DOSE AEPB Inhale 1 puff into the lungs every 12 (twelve) hours.  60 each  11  . gabapentin (NEURONTIN) 300 MG capsule Take 1 capsule (300 mg total) by mouth 2 (two) times daily.  60 capsule  11  . Glucosamine-Chondroit-MSM-C-Mn (FLEXI JOINT PO) Take by mouth as needed.        . hyoscyamine (ANASPAZ) 0.125 MG TBDP dissolve 1 tablet under the tongue every 2 hours if needed for bloating gas and CRAMPS  30 tablet  2  . hyoscyamine (HYOMAX-SL) 0.125 MG SL tablet Place 0.125 mg under the tongue every 2 (two) hours as needed.        Marland Kitchen  KLOR-CON M20 20 MEQ tablet TAKE 1 TABLET BY MOUTH ONCE DAILY  90 tablet  5  . magnesium oxide (MAG-OX) 400 MG tablet take 1 tablet by mouth once daily  30 tablet  2  . Magnesium Oxide 400 (241.3 MG) MG TABS take 1 tablet by mouth once daily  30 tablet  2  . metoprolol (TOPROL-XL) 50 MG 24 hr tablet Take 1.5 tablets (75 mg total) by mouth daily.  45 tablet  11  . PROAIR HFA 108 (90 BASE) MCG/ACT inhaler USE AS DIRECTED IF NEEDED  8.5 g  2  . sildenafil (VIAGRA) 100 MG tablet Take 1 tablet (100 mg total) by mouth as needed for erectile dysfunction. 1/2 or 1 as needed  10 tablet  11  . simvastatin (ZOCOR) 20 MG tablet Take 1 tablet (20 mg total) by mouth at bedtime.  30 tablet  11  . temazepam (RESTORIL) 30 MG capsule take 1 capsule by mouth at bedtime  30 capsule  5  . triamcinolone cream (KENALOG) 0.1 % apply twice a day to SKIN RASH AS NEEDED (AVOID USE OF FACE)  80 g  1    Current Facility-Administered Medications on File Prior to Visit  Medication Dose Route Frequency Provider Last Rate Last Dose  . zoster vaccine live (PF) (ZOSTAVAX) injection 19,400 Units  0.65 mL Subcutaneous Once Jacques Navy, MD       Review of Systems All otherwise neg per pt     Objective:   Physical Exam BP 110/68  Pulse 85  Temp(Src) 99.6 F (37.6 C) (Oral)  Ht 5\' 11"  (1.803 m)  Wt 199 lb 4 oz (90.379 kg)  BMI 27.79 kg/m2  SpO2 97% Physical Exam  VS noted Constitutional: Pt appears well-developed and well-nourished.  Eyes: Conjunctivae and EOM are normal. Pupils are equal, round, and reactive to light.  Neck: Normal range of motion. Neck supple.  Cardiovascular: Normal rate and regular rhythm.   Pulmonary/Chest: Effort normal and breath sounds normal.  Left knee with 1+ effusion, medial tender, FROM, no click Neurological: Pt is alert.motor intact Skin: Skin is warm. No erythema.  Psychiatric: Pt behavior is normal. Thought content normal. 1+ nervous    Assessment & Plan:

## 2011-10-08 NOTE — Patient Instructions (Signed)
You had the steroid shot today Take all new medications as prescribed Please call in 3-5 days if not improved, for orthopedic referral

## 2011-10-08 NOTE — Assessment & Plan Note (Signed)
?   OA flare vs other, pt would like to avoid ortho for now, will hold on films, tx with depomedrol , pain control, predpack trial,  to f/u any worsening symptoms or concerns, for ortho referral if not improved 3-5 days

## 2011-10-15 ENCOUNTER — Encounter: Payer: Self-pay | Admitting: Internal Medicine

## 2011-10-15 ENCOUNTER — Ambulatory Visit (INDEPENDENT_AMBULATORY_CARE_PROVIDER_SITE_OTHER)
Admission: RE | Admit: 2011-10-15 | Discharge: 2011-10-15 | Disposition: A | Discharge status: Home / Self Care | Payer: Medicare Other | Source: Ambulatory Visit | Attending: Internal Medicine | Admitting: Internal Medicine

## 2011-10-15 ENCOUNTER — Ambulatory Visit (INDEPENDENT_AMBULATORY_CARE_PROVIDER_SITE_OTHER): Payer: Medicare Other | Admitting: Internal Medicine

## 2011-10-15 VITALS — BP 106/62 | HR 61 | Temp 98.3°F | Resp 16 | Wt 197.0 lb

## 2011-10-15 DIAGNOSIS — M25562 Pain in left knee: Secondary | ICD-10-CM

## 2011-10-15 DIAGNOSIS — M1990 Unspecified osteoarthritis, unspecified site: Secondary | ICD-10-CM

## 2011-10-15 DIAGNOSIS — M25569 Pain in unspecified knee: Secondary | ICD-10-CM

## 2011-10-15 MED ORDER — METHYLPREDNISOLONE ACETATE 80 MG/ML IJ SUSP
80.0000 mg | Freq: Once | INTRAMUSCULAR | Status: AC
  Administered 2011-10-15: 80 mg via INTRAMUSCULAR

## 2011-10-16 ENCOUNTER — Encounter: Payer: Self-pay | Admitting: Internal Medicine

## 2011-10-17 NOTE — Assessment & Plan Note (Signed)
Patient with knee pain due to OA.  Plan - steroid injection left knee - good relief of pain

## 2011-10-17 NOTE — Progress Notes (Signed)
  Subjective:    Patient ID: Jack [name], male    DOB: Jul 20, 1928, 78 y.o.   MRN: [id number]  HPI Mr. Klas presents for persistent pain and swelling in the left knee. He was recently seen by Dr. Jonny Ruiz and started on oral prednisone. He has had some relief but continues to have pain.  PMH, FamHx and SocHx reviewed for any changes and relevance.    Review of Systems System review is negative for any constitutional, cardiac, pulmonary, GI or neuro symptoms or complaints other than as described in the HPI.     Objective:   Physical Exam Filed Vitals:   10/15/11 1527  BP: 106/62  Pulse: 61  Temp: 98.3 F (36.8 C)  Resp: 16   Gen'l- WNWD white man in no acute distress Cor- RRR Pulm - normal respirations Ext - left knee with minor swelling; normal ROM without crepitus, click or lock. Negative Drawer sign. No lateral or medical instability. Tender to palpation along the medial joint line.  Procedure Joint injection  Indication - localized pain-left knee Consent - informed verbal consent from patient after explanation of risks of bleeding and infection Prep - injection site identified, prepped with betadine followed by alcohol. Med -    80  Mg depomedrol with 0.5 Cc 2% xylocain Injection - joint space entered easily from medial aspect. Injected without difficulty. Patient tolerated this well. Post-procedure - patient with rapid reduction in discomfort. Bandaid applied. Routine precautions provided including instruction to return for fever, drainage or increased pain        Assessment & Plan:

## 2011-10-18 ENCOUNTER — Ambulatory Visit (INDEPENDENT_AMBULATORY_CARE_PROVIDER_SITE_OTHER): Payer: Medicare Other | Admitting: Endocrinology

## 2011-10-18 ENCOUNTER — Encounter: Payer: Self-pay | Admitting: Endocrinology

## 2011-10-18 ENCOUNTER — Other Ambulatory Visit (INDEPENDENT_AMBULATORY_CARE_PROVIDER_SITE_OTHER): Payer: Medicare Other

## 2011-10-18 VITALS — BP 132/80 | HR 64 | Temp 97.5°F | Ht 71.0 in | Wt 197.0 lb

## 2011-10-18 DIAGNOSIS — M25569 Pain in unspecified knee: Secondary | ICD-10-CM

## 2011-10-18 LAB — SEDIMENTATION RATE: Sed Rate: 5 mm/hr (ref 0–22)

## 2011-10-18 MED ORDER — COLCHICINE 0.6 MG PO TABS: 0.6000 mg | ORAL_TABLET | ORAL | Status: DC

## 2011-10-18 NOTE — Progress Notes (Signed)
Subjective:    Patient ID: Jack [name], male    DOB: 05/03/1929, 78 y.o.   MRN: [id number]  HPI Pt saw dr Debby Bud 3 days ago with left knee pain.  He received an injection into the knee, but he says there is little if any improvement in the pain.   He says he had an acute gouty attack many years ago.   Past Medical History  Diagnosis Date  . GERD (gastroesophageal reflux disease)   . Osteoarthritis   . Alcohol abuse     in recovery 2006  . Cirrhosis with alcoholism   . Irritable bowel   . Non Hodgkin's lymphoma     in remission  . Hyperlipidemia   . Insomnia   . Emphysema   . Carpal tunnel syndrome   . PAC (premature atrial contraction)     stable (ruled out for a fib)  . Prostatitis     chronic bacterial  . Pseudogout   . Sinus problem     chronic  . Cellulitis     2nd to T. Pedis  . Hearing loss     Past Surgical History  Procedure Date  . Esophagogastroduodenoscopy 01-27-02  . Inguinal hernia repair 1979  . Torn biceps repair     History   Social History  . Marital Status: Widowed    Spouse Name: N/A    Number of Children: 3  . Years of Education: 18   Occupational History  . newspaper Programmer, multimedia     [employer]   Social History Main Topics  . Smoking status: Former Smoker    Types: Cigarettes    Quit date: 06/24/1978  . Smokeless tobacco: Never Used  . Alcohol Use: 2.5 oz/week    5 drink(s) per week     Currently only drinks wine - in moderation. He is cutting back  . Drug Use: No  . Sexually Active: Not on file   Other Topics Concern  . Not on file   Social History Narrative   [employer] [place]. married '56- widowed '07. 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 lives alone: completed  renov[place] family farm[place]home in [place] but continues to make improvement ('12), keeps up his beach house; takes his extended family on great trips - [place] and [place] July '12. Socially active.  Active in  AAEnd of Life issues: He does want CPR; no prolonged intubation; no futile or heroic measure to maintain him if the quality of life isn't good.    Current Outpatient Prescriptions on File Prior to Visit  Medication Sig Dispense Refill  . aspirin 81 MG tablet Take 81 mg by mouth daily.        . celecoxib (CELEBREX) 100 MG capsule Take 1 capsule (100 mg total) by mouth 2 (two) times daily.  60 capsule  11  . diphenoxylate-atropine (LOMOTIL) 2.5-0.025 MG per tablet take 1 tablet by mouth AFTER EACH LOOSE STOOL,MAX OF 4 TABLETS A DAY  20 tablet  3  . esomeprazole (NEXIUM) 40 MG capsule Take 1 capsule (40 mg total) by mouth daily before breakfast.  30 capsule  11  . eszopiclone (LUNESTA) 2 MG TABS Take 1 tablet (2 mg total) by mouth at bedtime. Take immediately before bedtime  30 tablet  5  . Fluticasone-Salmeterol (ADVAIR DISKUS) 250-50 MCG/DOSE AEPB Inhale 1 puff into the lungs every 12 (twelve) hours.  60 each  11  . gabapentin (NEURONTIN) 300 MG capsule Take 1 capsule (  300 mg total) by mouth 2 (two) times daily.  60 capsule  11  . Glucosamine-Chondroit-MSM-C-Mn (FLEXI JOINT PO) Take by mouth as needed.        Marland Kitchen HYDROcodone-acetaminophen (NORCO) 5-325 MG per tablet Take 1 tablet by mouth every 6 (six) hours as needed for pain.  40 tablet  0  . hyoscyamine (ANASPAZ) 0.125 MG TBDP dissolve 1 tablet under the tongue every 2 hours if needed for bloating gas and CRAMPS  30 tablet  2  . hyoscyamine (HYOMAX-SL) 0.125 MG SL tablet Place 0.125 mg under the tongue every 2 (two) hours as needed.        Marland Kitchen KLOR-CON M20 20 MEQ tablet TAKE 1 TABLET BY MOUTH ONCE DAILY  90 tablet  5  . Magnesium Oxide 400 (241.3 MG) MG TABS take 1 tablet by mouth once daily  30 tablet  2  . metoprolol (TOPROL-XL) 50 MG 24 hr tablet Take 1.5 tablets (75 mg total) by mouth daily.  45 tablet  11  . predniSONE (DELTASONE) 10 MG tablet Take 1 tablet (10 mg total) by mouth daily. 3 tabs by mouth per day for 3 days,2tabs per day for 3  days,1tab per day for 3 days  18 tablet  0  . PROAIR HFA 108 (90 BASE) MCG/ACT inhaler USE AS DIRECTED IF NEEDED  8.5 g  2  . sildenafil (VIAGRA) 100 MG tablet Take 1 tablet (100 mg total) by mouth as needed for erectile dysfunction. 1/2 or 1 as needed  10 tablet  11  . simvastatin (ZOCOR) 20 MG tablet Take 1 tablet (20 mg total) by mouth at bedtime.  30 tablet  11  . temazepam (RESTORIL) 30 MG capsule take 1 capsule by mouth at bedtime  30 capsule  5  . triamcinolone cream (KENALOG) 0.1 % apply twice a day to SKIN RASH AS NEEDED (AVOID USE OF FACE)  80 g  1  . colchicine (COLCRYS) 0.6 MG tablet Take 1 tablet (0.6 mg total) by mouth every hour. Until pain is better, noy not exceed 6 per day.  Stop if diarrhea  6 tablet  0   Current Facility-Administered Medications on File Prior to Visit  Medication Dose Route Frequency Provider Last Rate Last Dose  . methylPREDNISolone acetate (DEPO-MEDROL) injection 120 mg  120 mg Intramuscular Once Corwin Levins, MD        Allergies  Allergen Reactions  . Amoxicillin     REACTION: rash  . Penicillins     Family History  Problem Relation Age of Onset  . Coronary artery disease Father   . Heart attack Father   . Cancer Brother     colon  . Other Other     TB    BP 132/80  Pulse 64  Temp(Src) 97.5 F (36.4 C) (Oral)  Ht 5\' 11"  (1.803 m)  Wt 197 lb (89.359 kg)  BMI 27.48 kg/m2  SpO2 96%  Review of Systems Denies fever    Objective:   Physical Exam VITAL SIGNS:  See vs page GENERAL: no distress Left knee: moderate swelling.  Minimal warmth.  No tenderness.  Full rom without pain.   Gait: favors LLE, due to pain.    Uric acid=7.5    Assessment & Plan:  Knee pain, persistent.  uncertain etiology.

## 2011-10-18 NOTE — Patient Instructions (Addendum)
blood tests are being requested for you today.  You will receive a letter with results. Try taking your hydrocodone pills, as needed for the pain. i have sent a prescription to your pharmacy, to take as needed for the pain.  This would only help if gout is the cause.   Skip 2 days of simvastatin if you take these pills.   I hope you feel better soon.  If you don't feel better by next week, please call dr Debby Bud.

## 2011-10-19 ENCOUNTER — Encounter: Payer: Self-pay | Admitting: Endocrinology

## 2011-10-20 ENCOUNTER — Other Ambulatory Visit: Payer: Self-pay | Admitting: Internal Medicine

## 2011-10-21 ENCOUNTER — Telehealth: Payer: Self-pay | Admitting: *Deleted

## 2011-10-21 NOTE — Telephone Encounter (Signed)
Called pt to inform of lab results, pt informed (letter also mailed to pt). 

## 2011-10-24 ENCOUNTER — Telehealth: Payer: Self-pay

## 2011-10-24 DIAGNOSIS — M25569 Pain in unspecified knee: Secondary | ICD-10-CM

## 2011-10-24 NOTE — Telephone Encounter (Signed)
Pt called requesting referral to Ortho for persistent knee pain. Thank you.

## 2011-10-25 NOTE — Telephone Encounter (Signed)
K- [facility] requested to set appt with Dr. Dion Saucier at [place]

## 2011-11-07 ENCOUNTER — Other Ambulatory Visit: Payer: Self-pay | Admitting: Internal Medicine

## 2011-11-19 ENCOUNTER — Telehealth: Payer: Self-pay | Admitting: Internal Medicine

## 2011-11-19 NOTE — Telephone Encounter (Signed)
Mr. Hilbun called and is hoping to speak with you regarding his knee.  He stated he saw an orthopedic Dr who gave him advice, but he wants your opinion on the situation also.  Do you want him worked in for an ov?    Thanks!

## 2011-11-19 NOTE — Telephone Encounter (Signed)
Called at 1906 hrs no answer, left a message that I will try again tomorrow

## 2011-11-20 NOTE — Telephone Encounter (Signed)
Called home and cell - no answer. Left msg

## 2011-11-22 ENCOUNTER — Encounter (INDEPENDENT_AMBULATORY_CARE_PROVIDER_SITE_OTHER): Payer: Medicare Other | Admitting: Internal Medicine

## 2011-11-22 DIAGNOSIS — M25569 Pain in unspecified knee: Secondary | ICD-10-CM

## 2011-11-22 NOTE — Assessment & Plan Note (Signed)
Jack [name] is prepared to have TKR but wants to delay until September. Asks opinion about treatment plan.  Plan Ok for trail of synvisc injections to buy him more time - cannot guarantee that this will help.  To discuss TKR with Dr. Dion Saucier - recommended to have spinal anesthesia if possible, to go for the best prosthesis possible even if not covered by medicare  Physical training for leg strength and flexibility prior to surgery will improve his rehab experience.

## 2011-11-22 NOTE — Telephone Encounter (Signed)
Patient seen 11/22/11

## 2011-11-22 NOTE — Progress Notes (Signed)
  Subjective:    Patient ID: Jack [name], male    DOB: Jan 25, 1929, 78 y.o.   MRN: [id number]  HPI Jack [name] presents to discuss his left knee DJD and plan for care. He has been seeing Dr. Dion Saucier.    Review of Systems     Objective:   Physical Exam        Assessment & Plan:

## 2011-12-01 ENCOUNTER — Other Ambulatory Visit: Payer: Self-pay | Admitting: Internal Medicine

## 2011-12-17 ENCOUNTER — Encounter: Payer: Self-pay | Admitting: Internal Medicine

## 2011-12-17 ENCOUNTER — Other Ambulatory Visit (INDEPENDENT_AMBULATORY_CARE_PROVIDER_SITE_OTHER): Payer: Medicare Other

## 2011-12-17 ENCOUNTER — Ambulatory Visit (INDEPENDENT_AMBULATORY_CARE_PROVIDER_SITE_OTHER): Payer: Medicare Other | Admitting: Internal Medicine

## 2011-12-17 VITALS — BP 120/70 | HR 72 | Temp 97.9°F | Resp 16 | Ht 71.0 in | Wt 201.0 lb

## 2011-12-17 DIAGNOSIS — K219 Gastro-esophageal reflux disease without esophagitis: Secondary | ICD-10-CM

## 2011-12-17 DIAGNOSIS — M1120 Other chondrocalcinosis, unspecified site: Secondary | ICD-10-CM

## 2011-12-17 DIAGNOSIS — G4700 Insomnia, unspecified: Secondary | ICD-10-CM

## 2011-12-17 DIAGNOSIS — M1710 Unilateral primary osteoarthritis, unspecified knee: Secondary | ICD-10-CM

## 2011-12-17 DIAGNOSIS — M1712 Unilateral primary osteoarthritis, left knee: Secondary | ICD-10-CM

## 2011-12-17 DIAGNOSIS — M5136 Other intervertebral disc degeneration, lumbar region: Secondary | ICD-10-CM

## 2011-12-17 DIAGNOSIS — E785 Hyperlipidemia, unspecified: Secondary | ICD-10-CM

## 2011-12-17 DIAGNOSIS — Z0000 Encounter for general adult medical examination without abnormal findings: Secondary | ICD-10-CM

## 2011-12-17 DIAGNOSIS — R002 Palpitations: Secondary | ICD-10-CM

## 2011-12-17 DIAGNOSIS — M1990 Unspecified osteoarthritis, unspecified site: Secondary | ICD-10-CM

## 2011-12-17 DIAGNOSIS — J438 Other emphysema: Secondary | ICD-10-CM

## 2011-12-17 DIAGNOSIS — C8589 Other specified types of non-Hodgkin lymphoma, extranodal and solid organ sites: Secondary | ICD-10-CM

## 2011-12-17 DIAGNOSIS — IMO0002 Reserved for concepts with insufficient information to code with codable children: Secondary | ICD-10-CM

## 2011-12-17 DIAGNOSIS — K7030 Alcoholic cirrhosis of liver without ascites: Secondary | ICD-10-CM

## 2011-12-17 DIAGNOSIS — M51369 Other intervertebral disc degeneration, lumbar region without mention of lumbar back pain or lower extremity pain: Secondary | ICD-10-CM

## 2011-12-17 LAB — COMPREHENSIVE METABOLIC PANEL
ALT: 25 U/L (ref 0–53)
Albumin: 3.9 g/dL (ref 3.5–5.2)
CO2: 29 mEq/L (ref 19–32)
Calcium: 9.3 mg/dL (ref 8.4–10.5)
Chloride: 102 mEq/L (ref 96–112)
GFR: 82.99 mL/min (ref >60.00)
Glucose, Bld: 108 mg/dL — ABNORMAL HIGH (ref 70–99)
Potassium: 4.5 mEq/L (ref 3.5–5.1)
Sodium: 139 mEq/L (ref 135–145)
Total Protein: 6.7 g/dL (ref 6.0–8.3)

## 2011-12-17 LAB — CBC WITH DIFFERENTIAL/PLATELET
Basophils Absolute: 0 10*3/uL (ref 0.0–0.1)
Basophils Relative: 0.8 % (ref 0.0–3.0)
Eosinophils Absolute: 0.2 10*3/uL (ref 0.0–0.7)
Hemoglobin: 12.5 g/dL — ABNORMAL LOW (ref 13.0–17.0)
Lymphocytes Relative: 26.2 % (ref 12.0–46.0)
Lymphs Abs: 1.6 10*3/uL (ref 0.7–4.0)
MCHC: 33.3 g/dL (ref 30.0–36.0)
MCV: 94.8 fl (ref 78.0–100.0)
Monocytes Absolute: 0.6 10*3/uL (ref 0.1–1.0)
Neutro Abs: 3.7 10*3/uL (ref 1.4–7.7)
RDW: 14.5 % (ref 11.5–14.6)

## 2011-12-17 LAB — HEPATIC FUNCTION PANEL
AST: 30 U/L (ref 0–37)
Albumin: 3.9 g/dL (ref 3.5–5.2)
Total Bilirubin: 0.5 mg/dL (ref 0.3–1.2)

## 2011-12-17 LAB — LIPID PANEL
HDL: 79.2 mg/dL (ref >39.00)
Total CHOL/HDL Ratio: 2

## 2011-12-17 LAB — URIC ACID: Uric Acid, Serum: 8.3 mg/dL — ABNORMAL HIGH (ref 4.0–7.8)

## 2011-12-17 NOTE — Progress Notes (Signed)
Subjective:    Patient ID: Jack [name], male    DOB: Dec 29, 1928, 78 y.o.   MRN: [id number]  HPI The patient is here for annual Medicare wellness examination and management of other chronic and acute problems.  He has been having knee problems and is getting synvisc injections. Follow-up is planned with Dr. Dion Saucier. Hopefully he will not need surgery.  He does have sleep problems which he describes as latency issues. He got no relief with Lunesta. Taking 60 mg (!) of temazepam does help by his report with no excessive drowsiness.   The risk factors are reflected in the social history.  The roster of all physicians providing medical care to patient - is listed in the Snapshot section of the chart.  Activities of daily living:  The patient is 100% inedpendent in all ADLs: dressing, toileting, feeding as well as independent mobility  Home safety : The patient has smoke detectors in the home.Fall - discussed making home fall safe.  They wear seatbelts. No firearms at home. There is no violence in the home.   There is no risks for hepatitis, STDs or HIV. There is no   history of blood transfusion. They have travel history to infectious disease endemic areas of the world and exercise appropriate precautions.  The patient has seen their dentist in the last six month. They have seen their eye doctor in the last year. They admit to any hearing difficulty and have  had audiologic testing in the last year.  They do not  have excessive sun exposure. Discussed the need for sun protection: hats, long sleeves and use of sunscreen if there is significant sun exposure.   Diet: the importance of a healthy diet is discussed. They do have a healthy  diet.  The patient has a regular exercise program: trainer at the gym , 1 hr duration, 3 per week.  The benefits of regular aerobic exercise were discussed.  Depression screen: there are no signs or vegative symptoms of depression- irritability, change in  appetite, anhedonia, sadness/tearfullness.  Cognitive assessment: the patient manages all their financial and personal affairs and is actively engaged.   The following portions of the patient's history were reviewed and updated as appropriate: allergies, current medications, past family history, past medical history,  past surgical history, past social history  and problem list.  Vision, hearing, body mass index were assessed and reviewed.   During the course of the visit the patient was educated and counseled about appropriate screening and preventive services including : fall prevention , diabetes screening, nutrition counseling, colorectal cancer screening, and recommended immunizations.  Past Medical History  Diagnosis Date  . GERD (gastroesophageal reflux disease)   . Osteoarthritis   . Alcohol abuse     in recovery 2006  . Cirrhosis with alcoholism   . Irritable bowel   . Non Hodgkin's lymphoma     in remission  . Hyperlipidemia   . Insomnia   . Emphysema   . Carpal tunnel syndrome   . PAC (premature atrial contraction)     stable (ruled out for a fib)  . Prostatitis     chronic bacterial  . Pseudogout   . Sinus problem     chronic  . Cellulitis     2nd to T. Pedis  . Hearing loss    Past Surgical History  Procedure Date  . Esophagogastroduodenoscopy 01-27-02  . Inguinal hernia repair 1979  . Torn biceps repair   . Hernia repair  Family History  Problem Relation Age of Onset  . Coronary artery disease Father   . Heart attack Father   . Cancer Brother     colon  . Other Other     TB   History   Social History  . Marital Status: Widowed    Spouse Name: N/A    Number of Children: 3  . Years of Education: 18   Occupational History  . newspaper Programmer, multimedia     [employer]   Social History Main Topics  . Smoking status: Former Smoker    Types: Cigarettes    Quit date: 06/24/1978  . Smokeless tobacco: Never Used  . Alcohol Use: 2.5 oz/week    5 drink(s) per  week     Currently only drinks wine - in moderation. He is cutting back  . Drug Use: No  . Sexually Active: Not on file   Other Topics Concern  . Not on file   Social History Narrative   [employer] [place]. married '56- widowed '07. 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 lives alone: completed  renovating family farm/home in [place] but continues to make improvement ('12), keeps up his beach house; takes his extended family on great trips - [place] and [place] July '12. Socially active.  Active in AAEnd of Life issues: He does want CPR; no prolonged intubation; no futile or heroic measure to maintain him if the quality of life isn't good.        Review of Systems System review is negative for any constitutional, cardiac, pulmonary, GI or neuro symptoms or complaints other than as described in the HPI.     Objective:   Physical Exam Filed Vitals:   12/17/11 1322  BP: 120/70  Pulse: 72  Temp: 97.9 F (36.6 C)  Resp: 16   Wt Readings from Last 3 Encounters:  12/17/11 201 lb (91.173 kg)  10/18/11 197 lb (89.359 kg)  10/15/11 197 lb (89.359 kg)    Gen'l: Well nourished well developed, mildly overweight white male in no acute distress  HEENT: Head: Normocephalic and atraumatic. Right Ear: hearing aid in place. Left Ear hearing aid in place. Nose: Nose normal. Mouth/Throat: Oropharynx is clear and moist. Dentition - native, in good repair. No buccal or palatal lesions. Posterior pharynx clear. Eyes: Conjunctivae and sclera clear. EOM intact. Pupils are equal, round, and reactive to light. Right eye exhibits no discharge. Left eye exhibits no discharge. Neck: Normal range of motion. Neck supple. No JVD present. No tracheal deviation present. No thyromegaly present.  Cardiovascular: Normal rate, regular rhythm, no gallop, no friction rub, no murmur heard.      Quiet precordium. 2+ radial and DP pulses . No carotid  bruits Pulmonary/Chest: Effort normal. No respiratory distress or increased WOB, no wheezes, no rales but audible breath sounds away from the chest. No chest wall deformity or CVAT. Abdominal: Soft. Protuberant,  bowel sounds are normal in all quadrants. He exhibits no distension, no tenderness, no rebound or guarding, No heptosplenomegaly  Genitourinary:  deferred Musculoskeletal: Normal range of motion. He exhibits no edema and no tenderness.       Small and large joints without redness, synovial thickening or deformity. Full range of motion preserved about all small, median and large joints.  Lymphadenopathy:    He has no cervical or supraclavicular adenopathy.  Neurological: He is alert and oriented to person, place, and time. CN II-XII intact. DTRs 2+ and symmetrical biceps, radial  and patellar tendons. Cerebellar function normal with no tremor, rigidity, normal gait and station.  Skin: Skin is warm and dry. No rash noted. No erythema.  Psychiatric: He has a normal mood and affect. His behavior is normal. Thought content normal.   Lab Results  Component Value Date   WBC 6.2 12/17/2011   HGB 12.5* 12/17/2011   HCT 37.7* 12/17/2011   PLT 153.0 12/17/2011   GLUCOSE 108* 12/17/2011   CHOL 172 12/17/2011   TRIG 194.0* 12/17/2011   HDL 79.20 12/17/2011   LDLDIRECT 86.5 10/21/2007   LDLCALC 54 12/17/2011        ALT 25 12/17/2011   AST 30 12/17/2011        NA 139 12/17/2011   K 4.5 12/17/2011   CL 102 12/17/2011   CREATININE 0.9 12/17/2011   BUN 13 12/17/2011   CO2 29 12/17/2011   TSH 0.65 11/30/2010   PSA 1.88 12/08/2009           Assessment & Plan:

## 2011-12-18 DIAGNOSIS — R002 Palpitations: Secondary | ICD-10-CM | POA: Insufficient documentation

## 2011-12-18 DIAGNOSIS — M5136 Other intervertebral disc degeneration, lumbar region: Secondary | ICD-10-CM | POA: Insufficient documentation

## 2011-12-18 DIAGNOSIS — Z0000 Encounter for general adult medical examination without abnormal findings: Secondary | ICD-10-CM | POA: Insufficient documentation

## 2011-12-18 NOTE — Assessment & Plan Note (Signed)
No c/o back pain and no symptoms.

## 2011-12-18 NOTE — Assessment & Plan Note (Signed)
Interval history significant for DJD left knee. Physical exam - normal except for knee tenderness and swelling, mildly increased abdominal girth. Lab results are excellent. He is current with colonoscopy and is not a candidate for further screening. Discussed pros and cons of prostate cancer screening (USPHCTF recommendations reviewed and ACU April '13 recommendations) and he is beyond the age for any further screening. Immunizations are up to date.  In summary - a very nice man who appears to be medically stable and a good candidate for TKR if needed although cardiac clearance is recommended. He will return in 1 year or sooner as needed.

## 2011-12-18 NOTE — Assessment & Plan Note (Signed)
Discussed the relative contra-indications for use of benzodiazepines in the elderly: habituation, extinction, increased risk of falls with fracture, pseudo-dementia. Discussed best treatment with improvement of sleep hygiene:  Sleep is a learned or unlearned behavior. 5 principles of sleep hygiene - 1) regular hour to retire and rise 7days/wk 2) no stimulants - caffeine, chocolat, alcohol, 3) regular exercise  - every afternoon  4) sleep sanctuary - a space that is right light, temperature, sound level, good bed where all you do is sleep. 5) No extinction behaviors, e.g. Laying in bed awake doing anything but sleeping. This means if you have a bad night - no naps, etc  Recommended reducing dose of temazepam over time: alternate 30mg / 60 mg for 1-2 weeks, then 30 mg nightly x 2 weeks, then with improved sleep hygiene reduce use to every other or every third night.

## 2011-12-18 NOTE — Assessment & Plan Note (Signed)
Asymptomatic and doing well. Cardiac risk factors: age, gender, lipids (controlled), h/o tobacco use. He does exercise on a regular basis.  Plan - referral to Dr. Antoine Poche for preoperative cardiac clearance with potential TKR in the next year.

## 2011-12-18 NOTE — Assessment & Plan Note (Signed)
Liver functions are normal. No stigmata of disease. He continues with AA although he will imbibe wine on occasion.  Plan -  maintain recovery

## 2011-12-18 NOTE — Assessment & Plan Note (Signed)
Excellent control on low dose Zocor: LDL much better than goal of 80 or less, HDL better than goal of 50 or higher. LIver functions are normal  Plan  Continue present medication and life-style management.

## 2011-12-18 NOTE — Assessment & Plan Note (Signed)
Stable on Advair twice a day. He uses SABA/rescue inhaler no more than 3 times a week.

## 2011-12-18 NOTE — Assessment & Plan Note (Signed)
Dr. Dion Saucier has provided Synvisc injections - full effect pending. [name] is using a knee brace and a cane. Negotiating stairs is difficult. 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  Plan  Follow-up with Dr. Dion Saucier. Should he come to TKR he will need cardiac clearing

## 2011-12-18 NOTE — Assessment & Plan Note (Signed)
Continues in full remission. He follows with [name] annually - last visit October '12

## 2011-12-21 ENCOUNTER — Other Ambulatory Visit: Payer: Self-pay | Admitting: Internal Medicine

## 2011-12-22 ENCOUNTER — Encounter: Payer: Self-pay | Admitting: Internal Medicine

## 2012-01-01 ENCOUNTER — Other Ambulatory Visit: Payer: Self-pay | Admitting: Internal Medicine

## 2012-01-08 ENCOUNTER — Ambulatory Visit (INDEPENDENT_AMBULATORY_CARE_PROVIDER_SITE_OTHER): Payer: Medicare Other | Admitting: Cardiovascular Disease

## 2012-01-08 ENCOUNTER — Encounter: Payer: Self-pay | Admitting: Cardiovascular Disease

## 2012-01-08 VITALS — BP 123/59 | HR 77 | Ht 71.0 in | Wt 198.0 lb

## 2012-01-08 DIAGNOSIS — R0609 Other forms of dyspnea: Secondary | ICD-10-CM

## 2012-01-08 DIAGNOSIS — I4510 Unspecified right bundle-branch block: Secondary | ICD-10-CM

## 2012-01-08 DIAGNOSIS — R002 Palpitations: Secondary | ICD-10-CM

## 2012-01-08 DIAGNOSIS — Z01810 Encounter for preprocedural cardiovascular examination: Secondary | ICD-10-CM

## 2012-01-08 DIAGNOSIS — R9431 Abnormal electrocardiogram [ECG] [EKG]: Secondary | ICD-10-CM

## 2012-01-08 DIAGNOSIS — R0989 Other specified symptoms and signs involving the circulatory and respiratory systems: Secondary | ICD-10-CM

## 2012-01-08 DIAGNOSIS — E785 Hyperlipidemia, unspecified: Secondary | ICD-10-CM

## 2012-01-08 DIAGNOSIS — R0600 Dyspnea, unspecified: Secondary | ICD-10-CM | POA: Insufficient documentation

## 2012-01-08 NOTE — Assessment & Plan Note (Signed)
Yearly ECG no evidence of high grade heart block.  In setting of preop clearence, dyspnea, palpitations and ETOH use will order echo to make sure LV/RV function are normal

## 2012-01-08 NOTE — Assessment & Plan Note (Signed)
If he decides on TKR with Dr Dion Saucier he is cleared.  Low risk.  Biggest issue may be DT;s with active ETOH history and would monitro for this

## 2012-01-08 NOTE — Progress Notes (Signed)
Patient ID: Jack [name], male   DOB: September 18, 1928, 78 y.o.   MRN: [id number]  78 yo referred by Dr Arthur Holms for preop assessment, dyspnea and palpitations.  No previous cardiac issues.  Palpitations are infrequent and benign sounding Skips that are non exertional.  Activity limited by some dyspnea. And left knee pain.  Previous smoker with COPD on inhalers.  Getting relief with shots and may not get surgery.  Alcoholic and not going to AA last few months.  Drinks wine every night.  No bleeding diathesis or history of varices.  Active with no angina or chest pain.  Note made of new RBBB on ECG today compared to 2012  ROS: Denies fever, malais, weight loss, blurry vision, decreased visual acuity, cough, sputum, SOB, hemoptysis, pleuritic pain, palpitaitons, heartburn, abdominal pain, melena, lower extremity edema, claudication, or rash.  All other systems reviewed and negative   General: Affect appropriate Healthy:  appears stated age HEENT: normal Neck supple with no adenopathy JVP normal no bruits no thyromegaly Lungs clear with no wheezing and good diaphragmatic motion Heart:  S1/S2 no murmur,rub, gallop or click PMI normal Abdomen: benighn, BS positve, no tenderness, no AAA no bruit.  No HSM or HJR Distal pulses intact with no bruits No edema Neuro non-focal Skin warm and dry No muscular weakness left knee in brace  Medications Current Outpatient Prescriptions  Medication Sig Dispense Refill  . ADVAIR DISKUS 250-50 MCG/DOSE AEPB inhale 1 dose by mouth every 12 hours  60 each  5  . aspirin 81 MG tablet Take 81 mg by mouth daily.        . CELEBREX 100 MG capsule take 1 capsule by mouth twice a day  60 capsule  11  . hyoscyamine (ANASPAZ) 0.125 MG TBDP dissolve 1 tablet under the tongue every 2 hours if needed for bloating gas and CRAMPS  30 tablet  2  . KLOR-CON M20 20 MEQ tablet TAKE 1 TABLET BY MOUTH ONCE DAILY  90 tablet  5  . Magnesium Oxide 400 (241.3 MG) MG TABS take 1 tablet by  mouth once daily  30 tablet  2  . metoprolol succinate (TOPROL-XL) 50 MG 24 hr tablet take 1 and 1/2 tablets by mouth once daily  45 tablet  5  . NEXIUM 40 MG capsule take 1 capsule by mouth once daily BEFORE BREAKFAST  30 capsule  5  . PROAIR HFA 108 (90 BASE) MCG/ACT inhaler USE AS DIRECTED IF NEEDED  8.5 g  2  . simvastatin (ZOCOR) 20 MG tablet take 1 tablet by mouth at bedtime  30 tablet  5  . temazepam (RESTORIL) 15 MG capsule Take 15 mg by mouth at bedtime as needed.      . triamcinolone cream (KENALOG) 0.1 % apply twice a day to SKIN RASH AS NEEDED (AVOID USE OF FACE)  80 g  1  . VIAGRA 100 MG tablet take 1 tablet by mouth if needed for ERECTILE DYSFUNCTION 1/2 OR 1 TABLET AS NEEDED  10 tablet  11    Allergies Amoxicillin and Penicillins  Family History: Family History  Problem Relation Age of Onset  . Coronary artery disease Father   . Heart attack Father   . Cancer Brother     colon  . Other Other     TB    Social History: History   Social History  . Marital Status: Widowed    Spouse Name: N/A    Number of Children: 3  . Years  of Education: 18   Occupational History  . newspaper Programmer, multimedia     [employer]   Social History Main Topics  . Smoking status: Former Smoker    Types: Cigarettes    Quit date: 06/24/1978  . Smokeless tobacco: Never Used  . Alcohol Use: 2.5 oz/week    5 drink(s) per week     Currently only drinks wine - in moderation. He is cutting back  . Drug Use: No  . Sexually Active: Not on file   Other Topics Concern  . Not on file   Social History Narrative   [employer] [place]. married '56- widowed '07. 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 lives alone: completed  renov[place] in [place] but continues to [place] improvement ('12), keeps up his beach house; takes his extended family on great trips - [place] and [place] July '12. Socially active.  Active in AAEnd of Life  issues: He does want CPR; no prolonged intubation; no futile or heroic measure to maintain him if the quality of life isn't good. HCPOA  Son - Bianca Vester ([phone].    Electrocardiogram:  NSR rate 59 normal ECG date 11/30/10  Today NSR rate 74 LAD and RBBB new since 2012  Assessment and Plan

## 2012-01-08 NOTE — Patient Instructions (Signed)
Your physician recommends that you schedule a follow-up appointment in: AS NEEDED Your physician recommends that you continue on your current medications as directed. Please refer to the Current Medication list given to you today. Your physician has requested that you have an echocardiogram. Echocardiography is a painless test that uses sound waves to create images of your heart. It provides your doctor with information about the size and shape of your heart and how well your heart's chambers and valves are working. This procedure takes approximately one hour. There are no restrictions for this procedure.  DX DYSPNEA ABN EKG

## 2012-01-08 NOTE — Assessment & Plan Note (Signed)
Cholesterol is at goal.  Continue current dose of statin and diet Rx.  No myalgias or side effects.  F/U  LFT's in 6 months. Lab Results  Component Value Date   LDLCALC 54 12/17/2011

## 2012-01-08 NOTE — Assessment & Plan Note (Signed)
Likely related to COPD.  Echo to assess RV/LV function.  No wheezing on exam.  Continue inhalers

## 2012-01-22 ENCOUNTER — Other Ambulatory Visit: Payer: Self-pay

## 2012-01-22 ENCOUNTER — Ambulatory Visit (HOSPITAL_COMMUNITY): Payer: Medicare Other | Attending: Cardiovascular Disease | Admitting: Radiology

## 2012-01-22 DIAGNOSIS — I059 Rheumatic mitral valve disease, unspecified: Secondary | ICD-10-CM | POA: Insufficient documentation

## 2012-01-22 DIAGNOSIS — J449 Chronic obstructive pulmonary disease, unspecified: Secondary | ICD-10-CM | POA: Insufficient documentation

## 2012-01-22 DIAGNOSIS — R002 Palpitations: Secondary | ICD-10-CM

## 2012-01-22 DIAGNOSIS — R0609 Other forms of dyspnea: Secondary | ICD-10-CM | POA: Insufficient documentation

## 2012-01-22 DIAGNOSIS — J4489 Other specified chronic obstructive pulmonary disease: Secondary | ICD-10-CM | POA: Insufficient documentation

## 2012-01-22 DIAGNOSIS — I079 Rheumatic tricuspid valve disease, unspecified: Secondary | ICD-10-CM | POA: Insufficient documentation

## 2012-01-22 DIAGNOSIS — R0989 Other specified symptoms and signs involving the circulatory and respiratory systems: Secondary | ICD-10-CM | POA: Insufficient documentation

## 2012-01-22 DIAGNOSIS — R9431 Abnormal electrocardiogram [ECG] [EKG]: Secondary | ICD-10-CM

## 2012-01-22 DIAGNOSIS — E785 Hyperlipidemia, unspecified: Secondary | ICD-10-CM | POA: Insufficient documentation

## 2012-01-22 DIAGNOSIS — R0600 Dyspnea, unspecified: Secondary | ICD-10-CM

## 2012-01-22 NOTE — Progress Notes (Signed)
Echocardiogram performed.  

## 2012-02-08 ENCOUNTER — Other Ambulatory Visit: Payer: Self-pay | Admitting: Internal Medicine

## 2012-02-15 ENCOUNTER — Other Ambulatory Visit: Payer: Self-pay | Admitting: Internal Medicine

## 2012-02-17 ENCOUNTER — Telehealth: Payer: Self-pay | Admitting: *Deleted

## 2012-02-17 NOTE — Telephone Encounter (Signed)
Ok for refill x 5 

## 2012-02-17 NOTE — Telephone Encounter (Signed)
Patient request refill on temazopam 30mg .  Last ov 12/17/2011

## 2012-02-18 MED ORDER — TEMAZEPAM 30 MG PO CAPS: ORAL_CAPSULE | ORAL | Status: DC

## 2012-02-18 NOTE — Telephone Encounter (Signed)
Rx faxed to pharmacy  

## 2012-02-19 NOTE — Telephone Encounter (Signed)
E-scribing error completed, medication called to [facility] . Left on doctor voice line at pharmacy.for  [facility]

## 2012-03-06 ENCOUNTER — Other Ambulatory Visit: Payer: Self-pay | Admitting: Orthopedic Surgery

## 2012-03-09 ENCOUNTER — Encounter (HOSPITAL_COMMUNITY): Payer: Self-pay | Admitting: Pharmacy Technician

## 2012-03-10 ENCOUNTER — Ambulatory Visit (HOSPITAL_COMMUNITY)
Admission: RE | Admit: 2012-03-10 | Discharge: 2012-03-10 | Disposition: A | Discharge status: Home / Self Care | Payer: Medicare Other | Source: Ambulatory Visit | Attending: Orthopedic Surgery | Admitting: Orthopedic Surgery

## 2012-03-10 ENCOUNTER — Encounter (HOSPITAL_COMMUNITY): Payer: Self-pay

## 2012-03-10 ENCOUNTER — Encounter (HOSPITAL_COMMUNITY)
Admission: RE | Admit: 2012-03-10 | Discharge: 2012-03-10 | Disposition: A | Discharge status: Home / Self Care | Payer: Medicare Other | Source: Ambulatory Visit | Attending: Orthopedic Surgery | Admitting: Orthopedic Surgery

## 2012-03-10 DIAGNOSIS — Z01812 Encounter for preprocedural laboratory examination: Secondary | ICD-10-CM | POA: Insufficient documentation

## 2012-03-10 DIAGNOSIS — R0602 Shortness of breath: Secondary | ICD-10-CM | POA: Insufficient documentation

## 2012-03-10 DIAGNOSIS — T8859XA Other complications of anesthesia, initial encounter: Secondary | ICD-10-CM

## 2012-03-10 DIAGNOSIS — Z01810 Encounter for preprocedural cardiovascular examination: Secondary | ICD-10-CM | POA: Insufficient documentation

## 2012-03-10 DIAGNOSIS — C8590 Non-Hodgkin lymphoma, unspecified, unspecified site: Secondary | ICD-10-CM

## 2012-03-10 DIAGNOSIS — I10 Essential (primary) hypertension: Secondary | ICD-10-CM | POA: Insufficient documentation

## 2012-03-10 DIAGNOSIS — R252 Cramp and spasm: Secondary | ICD-10-CM

## 2012-03-10 DIAGNOSIS — M1710 Unilateral primary osteoarthritis, unspecified knee: Secondary | ICD-10-CM | POA: Insufficient documentation

## 2012-03-10 DIAGNOSIS — K7030 Alcoholic cirrhosis of liver without ascites: Secondary | ICD-10-CM

## 2012-03-10 HISTORY — DX: Non-Hodgkin lymphoma, unspecified, unspecified site: C85.90

## 2012-03-10 HISTORY — DX: Adverse effect of unspecified anesthetic, initial encounter: T41.45XA

## 2012-03-10 HISTORY — DX: Cramp and spasm: R25.2

## 2012-03-10 HISTORY — DX: Other complications of anesthesia, initial encounter: T88.59XA

## 2012-03-10 HISTORY — PX: OTHER SURGICAL HISTORY: SHX169

## 2012-03-10 HISTORY — DX: Alcoholic cirrhosis of liver without ascites: K70.30

## 2012-03-10 LAB — SURGICAL PCR SCREEN
MRSA, PCR: NEGATIVE
Staphylococcus aureus: NEGATIVE

## 2012-03-10 LAB — BASIC METABOLIC PANEL
Calcium: 9.5 mg/dL (ref 8.4–10.5)
Chloride: 99 mEq/L (ref 96–112)
Creatinine, Ser: 0.91 mg/dL (ref 0.50–1.35)
GFR calc Af Amer: >90 mL/min (ref >90)
GFR calc non Af Amer: 78 mL/min — ABNORMAL LOW (ref >90)

## 2012-03-10 LAB — CBC
MCHC: 33.9 g/dL (ref 30.0–36.0)
Platelets: 150 10*3/uL (ref 150–400)
RDW: 13.2 % (ref 11.5–15.5)
WBC: 5.4 10*3/uL (ref 4.0–10.5)

## 2012-03-10 LAB — PROTIME-INR
INR: 1 (ref 0.00–1.49)
Prothrombin Time: 13.1 seconds (ref 11.6–15.2)

## 2012-03-10 LAB — URINALYSIS, ROUTINE W REFLEX MICROSCOPIC
Bilirubin Urine: NEGATIVE
Ketones, ur: NEGATIVE mg/dL
Leukocytes, UA: NEGATIVE
Nitrite: NEGATIVE
Protein, ur: NEGATIVE mg/dL

## 2012-03-10 NOTE — Patient Instructions (Signed)
20 DARBY KAUP  03/10/2012   Your procedure is scheduled on:  9-24 -2013  Report to [facility] at       0530 AM .  Call this number if you have problems the morning of surgery: [phone]  Or Presurgical Testing [phone](Kobyn Kray)   Remember:   Do not eat food:After Midnight.    Take these medicines the morning of surgery with A SIP OF WATER: Nexium, Magnesium, Alfuzosin,(take Metoprolol-as usual Pm before). Bring eye drops and Inhalers to hospital. Use Advair AM of.   Do not wear jewelry, make-up or nail polish.  Do not wear lotions, powders, or perfumes. You may wear deodorant.  Do not shave 48 hours prior to surgery.(face and neck okay, no shaving of legs)  Do not bring valuables to the hospital.  Contacts, dentures or bridgework may not be worn into surgery.  Leave suitcase in the car. After surgery it may be brought to your room.  For patients admitted to the hospital, checkout time is 11:00 AM the day of discharge.   Patients discharged the day of surgery will not be allowed to drive home. Must have responsible person with you x 24 hours once discharged.  Name and phone number of your driver: Daughter Olegario Messier [phone]  Special Instructions: CHG Shower Use Special Wash: 1/2 bottle night before surgery and 1/2 bottle morning of surgery.(avoid face and genitals)-see special instruction sheet.   Please read over the following fact sheets that you were given: MRSA Information, Blood Transfusion fact sheet, Incentive Spirometry Instruction.

## 2012-03-17 ENCOUNTER — Encounter (HOSPITAL_COMMUNITY)
Admission: RE | Disposition: A | Discharge status: Skilled Nursing Facility | Payer: Self-pay | Source: Ambulatory Visit | Attending: Orthopedic Surgery

## 2012-03-17 ENCOUNTER — Ambulatory Visit (HOSPITAL_COMMUNITY): Payer: Medicare Other | Admitting: Anesthesiology

## 2012-03-17 ENCOUNTER — Encounter (HOSPITAL_COMMUNITY): Payer: Self-pay | Admitting: Anesthesiology

## 2012-03-17 ENCOUNTER — Ambulatory Visit (HOSPITAL_COMMUNITY): Payer: Medicare Other

## 2012-03-17 ENCOUNTER — Encounter (HOSPITAL_COMMUNITY): Payer: Self-pay | Admitting: Orthopedic Surgery

## 2012-03-17 ENCOUNTER — Encounter (HOSPITAL_COMMUNITY): Payer: Self-pay | Admitting: *Deleted

## 2012-03-17 ENCOUNTER — Inpatient Hospital Stay (HOSPITAL_COMMUNITY)
Admission: RE | Admit: 2012-03-17 | Discharge: 2012-03-20 | DRG: 470 | Disposition: A | Discharge status: Skilled Nursing Facility | Payer: Medicare Other | Source: Ambulatory Visit | Attending: Orthopedic Surgery | Admitting: Orthopedic Surgery

## 2012-03-17 DIAGNOSIS — M1710 Unilateral primary osteoarthritis, unspecified knee: Principal | ICD-10-CM | POA: Diagnosis present

## 2012-03-17 DIAGNOSIS — J438 Other emphysema: Secondary | ICD-10-CM | POA: Diagnosis present

## 2012-03-17 DIAGNOSIS — M109 Gout, unspecified: Secondary | ICD-10-CM | POA: Diagnosis present

## 2012-03-17 DIAGNOSIS — K7030 Alcoholic cirrhosis of liver without ascites: Secondary | ICD-10-CM | POA: Diagnosis present

## 2012-03-17 DIAGNOSIS — Z8249 Family history of ischemic heart disease and other diseases of the circulatory system: Secondary | ICD-10-CM

## 2012-03-17 DIAGNOSIS — I10 Essential (primary) hypertension: Secondary | ICD-10-CM | POA: Diagnosis present

## 2012-03-17 DIAGNOSIS — M1711 Unilateral primary osteoarthritis, right knee: Secondary | ICD-10-CM | POA: Diagnosis present

## 2012-03-17 DIAGNOSIS — R42 Dizziness and giddiness: Secondary | ICD-10-CM | POA: Diagnosis not present

## 2012-03-17 DIAGNOSIS — I491 Atrial premature depolarization: Secondary | ICD-10-CM | POA: Diagnosis present

## 2012-03-17 DIAGNOSIS — E785 Hyperlipidemia, unspecified: Secondary | ICD-10-CM | POA: Diagnosis present

## 2012-03-17 DIAGNOSIS — M1712 Unilateral primary osteoarthritis, left knee: Secondary | ICD-10-CM

## 2012-03-17 DIAGNOSIS — F1021 Alcohol dependence, in remission: Secondary | ICD-10-CM | POA: Diagnosis present

## 2012-03-17 DIAGNOSIS — M1120 Other chondrocalcinosis, unspecified site: Secondary | ICD-10-CM

## 2012-03-17 DIAGNOSIS — T4275XA Adverse effect of unspecified antiepileptic and sedative-hypnotic drugs, initial encounter: Secondary | ICD-10-CM | POA: Diagnosis not present

## 2012-03-17 DIAGNOSIS — Z87891 Personal history of nicotine dependence: Secondary | ICD-10-CM

## 2012-03-17 DIAGNOSIS — G4700 Insomnia, unspecified: Secondary | ICD-10-CM | POA: Diagnosis present

## 2012-03-17 DIAGNOSIS — Z79899 Other long term (current) drug therapy: Secondary | ICD-10-CM

## 2012-03-17 DIAGNOSIS — C8589 Other specified types of non-Hodgkin lymphoma, extranodal and solid organ sites: Secondary | ICD-10-CM | POA: Diagnosis present

## 2012-03-17 DIAGNOSIS — K219 Gastro-esophageal reflux disease without esophagitis: Secondary | ICD-10-CM | POA: Diagnosis present

## 2012-03-17 DIAGNOSIS — Z880 Allergy status to penicillin: Secondary | ICD-10-CM

## 2012-03-17 HISTORY — PX: TOTAL KNEE ARTHROPLASTY: SHX125

## 2012-03-17 HISTORY — DX: Unilateral primary osteoarthritis, left knee: M17.12

## 2012-03-17 SURGERY — ARTHROPLASTY, KNEE, TOTAL
Anesthesia: Spinal | Site: Knee | Laterality: Left | Wound class: Clean

## 2012-03-17 MED ORDER — METHOCARBAMOL 500 MG PO TABS
500.0000 mg | ORAL_TABLET | Freq: Four times a day (QID) | ORAL | Status: DC | PRN
  Administered 2012-03-18 – 2012-03-20 (×7): 500 mg via ORAL
  Filled 2012-03-17 (×7): qty 1

## 2012-03-17 MED ORDER — ALUM & MAG HYDROXIDE-SIMETH 200-200-20 MG/5ML PO SUSP: 30.0000 mL | ORAL | Status: DC | PRN

## 2012-03-17 MED ORDER — HYDROMORPHONE HCL PF 1 MG/ML IJ SOLN
0.5000 mg | INTRAMUSCULAR | Status: DC | PRN
  Administered 2012-03-17 – 2012-03-18 (×2): 1 mg via INTRAVENOUS
  Filled 2012-03-17 (×2): qty 1

## 2012-03-17 MED ORDER — DOCUSATE SODIUM 100 MG PO CAPS
100.0000 mg | ORAL_CAPSULE | Freq: Two times a day (BID) | ORAL | Status: DC
  Administered 2012-03-17 – 2012-03-20 (×6): 100 mg via ORAL

## 2012-03-17 MED ORDER — ENOXAPARIN SODIUM 30 MG/0.3ML ~~LOC~~ SOLN: 30.0000 mg | Freq: Two times a day (BID) | SUBCUTANEOUS | Status: DC

## 2012-03-17 MED ORDER — HYOSCYAMINE SULFATE 0.125 MG PO TBDP
0.1250 mg | ORAL_TABLET | ORAL | Status: DC | PRN
  Filled 2012-03-17: qty 1

## 2012-03-17 MED ORDER — PANTOPRAZOLE SODIUM 40 MG PO TBEC
80.0000 mg | DELAYED_RELEASE_TABLET | Freq: Every day | ORAL | Status: DC
  Administered 2012-03-18 – 2012-03-20 (×3): 80 mg via ORAL
  Filled 2012-03-17 (×3): qty 2

## 2012-03-17 MED ORDER — QUININE SULFATE 324 MG PO CAPS
324.0000 mg | ORAL_CAPSULE | Freq: Two times a day (BID) | ORAL | Status: DC
  Administered 2012-03-18 – 2012-03-20 (×5): 324 mg via ORAL
  Filled 2012-03-17 (×8): qty 1

## 2012-03-17 MED ORDER — MENTHOL 3 MG MT LOZG: 1.0000 | LOZENGE | OROMUCOSAL | Status: DC | PRN

## 2012-03-17 MED ORDER — LACTATED RINGERS IV SOLN
INTRAVENOUS | Status: DC | PRN
  Administered 2012-03-17 (×2): via INTRAVENOUS

## 2012-03-17 MED ORDER — METOCLOPRAMIDE HCL 10 MG PO TABS: 5.0000 mg | ORAL_TABLET | Freq: Three times a day (TID) | ORAL | Status: DC | PRN

## 2012-03-17 MED ORDER — QUININE SULFATE 324 MG PO CAPS: 324.0000 mg | ORAL_CAPSULE | Freq: Two times a day (BID) | ORAL | Status: DC

## 2012-03-17 MED ORDER — ALFUZOSIN HCL ER 10 MG PO TB24
10.0000 mg | ORAL_TABLET | Freq: Every day | ORAL | Status: DC
  Administered 2012-03-17 – 2012-03-20 (×4): 10 mg via ORAL
  Filled 2012-03-17 (×4): qty 1

## 2012-03-17 MED ORDER — GINKGO BILOBA 40 MG PO TABS: 40.0000 mg | ORAL_TABLET | Freq: Two times a day (BID) | ORAL | Status: DC | PRN

## 2012-03-17 MED ORDER — ACETAMINOPHEN 10 MG/ML IV SOLN
INTRAVENOUS | Status: AC
  Filled 2012-03-17: qty 100

## 2012-03-17 MED ORDER — CELECOXIB 100 MG PO CAPS: 100.0000 mg | ORAL_CAPSULE | Freq: Two times a day (BID) | ORAL | Status: DC

## 2012-03-17 MED ORDER — NEPAFENAC 0.1 % OP SUSP
1.0000 [drp] | Freq: Three times a day (TID) | OPHTHALMIC | Status: DC
  Administered 2012-03-17 – 2012-03-20 (×9): 1 [drp] via OPHTHALMIC

## 2012-03-17 MED ORDER — PROMETHAZINE HCL 25 MG/ML IJ SOLN: 6.2500 mg | INTRAMUSCULAR | Status: DC | PRN

## 2012-03-17 MED ORDER — HYDROMORPHONE HCL PF 1 MG/ML IJ SOLN: 0.2500 mg | INTRAMUSCULAR | Status: DC | PRN

## 2012-03-17 MED ORDER — POLYETHYLENE GLYCOL 3350 17 G PO PACK: 17.0000 g | PACK | Freq: Every day | ORAL | Status: DC | PRN

## 2012-03-17 MED ORDER — OXYCODONE HCL 5 MG PO TABS
5.0000 mg | ORAL_TABLET | ORAL | Status: DC | PRN
  Administered 2012-03-17: 10 mg via ORAL
  Administered 2012-03-17 (×2): 5 mg via ORAL
  Administered 2012-03-17 – 2012-03-18 (×2): 10 mg via ORAL
  Administered 2012-03-18 (×4): 5 mg via ORAL
  Administered 2012-03-18 – 2012-03-19 (×2): 10 mg via ORAL
  Administered 2012-03-19 (×2): 5 mg via ORAL
  Administered 2012-03-19: 10 mg via ORAL
  Administered 2012-03-19: 5 mg via ORAL
  Administered 2012-03-20 (×3): 10 mg via ORAL
  Filled 2012-03-17: qty 2
  Filled 2012-03-17: qty 1
  Filled 2012-03-17: qty 2
  Filled 2012-03-17: qty 1
  Filled 2012-03-17 (×3): qty 2
  Filled 2012-03-17 (×2): qty 1
  Filled 2012-03-17: qty 2
  Filled 2012-03-17 (×2): qty 1
  Filled 2012-03-17: qty 2
  Filled 2012-03-17: qty 1
  Filled 2012-03-17: qty 2
  Filled 2012-03-17 (×2): qty 1
  Filled 2012-03-17: qty 2

## 2012-03-17 MED ORDER — VANCOMYCIN HCL IN DEXTROSE 1-5 GM/200ML-% IV SOLN
1000.0000 mg | Freq: Two times a day (BID) | INTRAVENOUS | Status: AC
  Administered 2012-03-17: 1000 mg via INTRAVENOUS
  Filled 2012-03-17: qty 200

## 2012-03-17 MED ORDER — FENTANYL CITRATE 0.05 MG/ML IJ SOLN: INTRAMUSCULAR | Status: DC | PRN

## 2012-03-17 MED ORDER — LACTATED RINGERS IV SOLN: INTRAVENOUS | Status: DC

## 2012-03-17 MED ORDER — POTASSIUM CHLORIDE IN NACL 20-0.45 MEQ/L-% IV SOLN
INTRAVENOUS | Status: DC
  Administered 2012-03-17 – 2012-03-18 (×2): via INTRAVENOUS
  Filled 2012-03-17 (×7): qty 1000

## 2012-03-17 MED ORDER — ENOXAPARIN SODIUM 30 MG/0.3ML ~~LOC~~ SOLN
30.0000 mg | Freq: Two times a day (BID) | SUBCUTANEOUS | Status: DC
  Filled 2012-03-17: qty 0.3

## 2012-03-17 MED ORDER — TRIAMCINOLONE ACETONIDE 0.1 % EX CREA
1.0000 "application " | TOPICAL_CREAM | Freq: Two times a day (BID) | CUTANEOUS | Status: DC
  Administered 2012-03-19 (×2): 1 via TOPICAL
  Filled 2012-03-17: qty 15

## 2012-03-17 MED ORDER — HYDROMORPHONE HCL PF 1 MG/ML IJ SOLN
0.5000 mg | INTRAMUSCULAR | Status: DC | PRN
  Administered 2012-03-17: 0.5 mg via INTRAVENOUS
  Filled 2012-03-17: qty 1

## 2012-03-17 MED ORDER — WARFARIN VIDEO: Freq: Once | Status: DC

## 2012-03-17 MED ORDER — ACETAMINOPHEN 10 MG/ML IV SOLN
INTRAVENOUS | Status: DC | PRN
  Administered 2012-03-17: 1000 mg via INTRAVENOUS

## 2012-03-17 MED ORDER — ONDANSETRON HCL 4 MG/2ML IJ SOLN
4.0000 mg | Freq: Four times a day (QID) | INTRAMUSCULAR | Status: DC | PRN
  Administered 2012-03-17: 4 mg via INTRAVENOUS
  Filled 2012-03-17: qty 2

## 2012-03-17 MED ORDER — METOPROLOL SUCCINATE ER 25 MG PO TB24
75.0000 mg | ORAL_TABLET | Freq: Every morning | ORAL | Status: DC
  Administered 2012-03-17 – 2012-03-20 (×4): 75 mg via ORAL
  Filled 2012-03-17 (×4): qty 1

## 2012-03-17 MED ORDER — ALBUTEROL SULFATE HFA 108 (90 BASE) MCG/ACT IN AERS
2.0000 | INHALATION_SPRAY | Freq: Four times a day (QID) | RESPIRATORY_TRACT | Status: DC | PRN
  Filled 2012-03-17: qty 6.7

## 2012-03-17 MED ORDER — BUPIVACAINE IN DEXTROSE 0.75-8.25 % IT SOLN
INTRATHECAL | Status: DC | PRN
  Administered 2012-03-17: 2 mL via INTRATHECAL

## 2012-03-17 MED ORDER — ACETAMINOPHEN 650 MG RE SUPP: 650.0000 mg | Freq: Four times a day (QID) | RECTAL | Status: DC | PRN

## 2012-03-17 MED ORDER — METOCLOPRAMIDE HCL 5 MG/ML IJ SOLN: 5.0000 mg | Freq: Three times a day (TID) | INTRAMUSCULAR | Status: DC | PRN

## 2012-03-17 MED ORDER — MAGNESIUM OXIDE 400 (241.3 MG) MG PO TABS
400.0000 mg | ORAL_TABLET | Freq: Every day | ORAL | Status: DC
  Administered 2012-03-17 – 2012-03-20 (×4): 400 mg via ORAL
  Filled 2012-03-17 (×4): qty 1

## 2012-03-17 MED ORDER — SENNA 8.6 MG PO TABS
1.0000 | ORAL_TABLET | Freq: Two times a day (BID) | ORAL | Status: DC
  Administered 2012-03-17 – 2012-03-20 (×6): 8.6 mg via ORAL
  Filled 2012-03-17 (×6): qty 1

## 2012-03-17 MED ORDER — SENNA-DOCUSATE SODIUM 8.6-50 MG PO TABS: 1.0000 | ORAL_TABLET | Freq: Every day | ORAL | Status: DC

## 2012-03-17 MED ORDER — HYDROMORPHONE HCL PF 1 MG/ML IJ SOLN
INTRAMUSCULAR | Status: AC
  Filled 2012-03-17: qty 1

## 2012-03-17 MED ORDER — VANCOMYCIN HCL IN DEXTROSE 1-5 GM/200ML-% IV SOLN
INTRAVENOUS | Status: AC
  Filled 2012-03-17: qty 200

## 2012-03-17 MED ORDER — TEMAZEPAM 15 MG PO CAPS
30.0000 mg | ORAL_CAPSULE | Freq: Every day | ORAL | Status: DC
  Administered 2012-03-17 – 2012-03-19 (×2): 30 mg via ORAL
  Filled 2012-03-17 (×2): qty 1
  Filled 2012-03-17: qty 2

## 2012-03-17 MED ORDER — DEXTROSE 5 % IV SOLN
500.0000 mg | Freq: Four times a day (QID) | INTRAVENOUS | Status: DC | PRN
  Administered 2012-03-17 (×2): 500 mg via INTRAVENOUS
  Filled 2012-03-17 (×3): qty 5

## 2012-03-17 MED ORDER — SODIUM CHLORIDE 0.9 % IR SOLN
Status: DC | PRN
  Administered 2012-03-17: 3000 mL

## 2012-03-17 MED ORDER — WARFARIN SODIUM 5 MG PO TABS: 5.0000 mg | ORAL_TABLET | Freq: Every day | ORAL | Status: DC

## 2012-03-17 MED ORDER — ENOXAPARIN SODIUM 30 MG/0.3ML ~~LOC~~ SOLN
30.0000 mg | Freq: Two times a day (BID) | SUBCUTANEOUS | Status: DC
  Administered 2012-03-18 – 2012-03-20 (×5): 30 mg via SUBCUTANEOUS
  Filled 2012-03-17 (×7): qty 0.3

## 2012-03-17 MED ORDER — SODIUM CHLORIDE 0.9 % IR SOLN
Status: DC | PRN
  Administered 2012-03-17: 1000 mL

## 2012-03-17 MED ORDER — ACETAMINOPHEN 325 MG PO TABS: 650.0000 mg | ORAL_TABLET | Freq: Four times a day (QID) | ORAL | Status: DC | PRN

## 2012-03-17 MED ORDER — DIPHENOXYLATE-ATROPINE 2.5-0.025 MG PO TABS: 1.0000 | ORAL_TABLET | Freq: Four times a day (QID) | ORAL | Status: DC | PRN

## 2012-03-17 MED ORDER — VANCOMYCIN HCL IN DEXTROSE 1-5 GM/200ML-% IV SOLN
1000.0000 mg | INTRAVENOUS | Status: AC
  Administered 2012-03-17: 1000 mg via INTRAVENOUS

## 2012-03-17 MED ORDER — WARFARIN - PHARMACIST DOSING INPATIENT: Freq: Every day | Status: DC

## 2012-03-17 MED ORDER — DIFLUPREDNATE 0.05 % OP EMUL
1.0000 [drp] | Freq: Three times a day (TID) | OPHTHALMIC | Status: DC
  Administered 2012-03-17 – 2012-03-20 (×9): 1 [drp] via OPHTHALMIC

## 2012-03-17 MED ORDER — SORBITOL 70 % SOLN
30.0000 mL | Freq: Every day | Status: DC | PRN
  Filled 2012-03-17: qty 30

## 2012-03-17 MED ORDER — ACETAMINOPHEN 10 MG/ML IV SOLN
1000.0000 mg | Freq: Four times a day (QID) | INTRAVENOUS | Status: AC
  Administered 2012-03-17 – 2012-03-18 (×4): 1000 mg via INTRAVENOUS
  Filled 2012-03-17 (×6): qty 100

## 2012-03-17 MED ORDER — BESIFLOXACIN HCL 0.6 % OP SUSP
1.0000 [drp] | Freq: Three times a day (TID) | OPHTHALMIC | Status: DC
  Administered 2012-03-17 – 2012-03-20 (×9): 1 [drp] via OPHTHALMIC

## 2012-03-17 MED ORDER — MIDAZOLAM HCL 5 MG/5ML IJ SOLN
INTRAMUSCULAR | Status: DC | PRN
  Administered 2012-03-17: 2 mg via INTRAVENOUS

## 2012-03-17 MED ORDER — DORZOLAMIDE HCL 2 % OP SOLN
1.0000 [drp] | Freq: Three times a day (TID) | OPHTHALMIC | Status: DC
  Administered 2012-03-17 – 2012-03-20 (×9): 1 [drp] via OPHTHALMIC
  Filled 2012-03-17: qty 10

## 2012-03-17 MED ORDER — DIPHENHYDRAMINE HCL 12.5 MG/5ML PO ELIX: 12.5000 mg | ORAL_SOLUTION | ORAL | Status: DC | PRN

## 2012-03-17 MED ORDER — ACETAMINOPHEN 10 MG/ML IV SOLN: 15.0000 mg/kg | Freq: Four times a day (QID) | INTRAVENOUS | Status: DC

## 2012-03-17 MED ORDER — WARFARIN SODIUM 5 MG PO TABS
5.0000 mg | ORAL_TABLET | Freq: Once | ORAL | Status: DC
  Filled 2012-03-17: qty 1

## 2012-03-17 MED ORDER — POTASSIUM CHLORIDE CRYS ER 20 MEQ PO TBCR
20.0000 meq | EXTENDED_RELEASE_TABLET | Freq: Every morning | ORAL | Status: DC
  Administered 2012-03-17 – 2012-03-20 (×4): 20 meq via ORAL
  Filled 2012-03-17 (×4): qty 1

## 2012-03-17 MED ORDER — OXYCODONE-ACETAMINOPHEN 5-325 MG PO TABS: 1.0000 | ORAL_TABLET | Freq: Four times a day (QID) | ORAL | Status: DC | PRN

## 2012-03-17 MED ORDER — PROPOFOL 10 MG/ML IV EMUL
INTRAVENOUS | Status: DC | PRN
  Administered 2012-03-17: 25 ug/kg/min via INTRAVENOUS

## 2012-03-17 MED ORDER — ONDANSETRON HCL 4 MG PO TABS: 4.0000 mg | ORAL_TABLET | Freq: Four times a day (QID) | ORAL | Status: DC | PRN

## 2012-03-17 MED ORDER — PATIENT'S GUIDE TO USING COUMADIN BOOK
Freq: Once | Status: DC
  Filled 2012-03-17: qty 1

## 2012-03-17 MED ORDER — BISACODYL 5 MG PO TBEC: 5.0000 mg | DELAYED_RELEASE_TABLET | Freq: Every day | ORAL | Status: DC | PRN

## 2012-03-17 MED ORDER — SIMVASTATIN 20 MG PO TABS
20.0000 mg | ORAL_TABLET | Freq: Every day | ORAL | Status: DC
  Administered 2012-03-17 – 2012-03-19 (×3): 20 mg via ORAL
  Filled 2012-03-17 (×4): qty 1

## 2012-03-17 MED ORDER — CELECOXIB 100 MG PO CAPS
100.0000 mg | ORAL_CAPSULE | Freq: Two times a day (BID) | ORAL | Status: DC
Start: 2012-03-17 — End: 2012-03-20
  Administered 2012-03-17 – 2012-03-20 (×6): 100 mg via ORAL
  Filled 2012-03-17 (×7): qty 1

## 2012-03-17 MED ORDER — PHENOL 1.4 % MT LIQD: 1.0000 | OROMUCOSAL | Status: DC | PRN

## 2012-03-17 MED ORDER — LORATADINE 10 MG PO TABS
10.0000 mg | ORAL_TABLET | Freq: Every day | ORAL | Status: DC
  Administered 2012-03-18 – 2012-03-20 (×3): 10 mg via ORAL
  Filled 2012-03-17 (×3): qty 1

## 2012-03-17 MED ORDER — FLUTICASONE-SALMETEROL 250-50 MCG/DOSE IN AEPB
1.0000 | INHALATION_SPRAY | Freq: Two times a day (BID) | RESPIRATORY_TRACT | Status: DC
  Administered 2012-03-17 – 2012-03-20 (×5): 1 via RESPIRATORY_TRACT
  Filled 2012-03-17: qty 14

## 2012-03-17 SURGICAL SUPPLY — 55 items
6 INCH 15 YARD ACE ×2 IMPLANT
APL SKNCLS STERI-STRIP NONHPOA (GAUZE/BANDAGES/DRESSINGS) ×1
BAG SPEC THK2 15X12 ZIP CLS (MISCELLANEOUS) ×1
BAG ZIPLOCK 12X15 (MISCELLANEOUS) ×2 IMPLANT
BANDAGE ESMARK 6X9 LF (GAUZE/BANDAGES/DRESSINGS) ×1 IMPLANT
BENZOIN TINCTURE PRP APPL 2/3 (GAUZE/BANDAGES/DRESSINGS) ×2 IMPLANT
BLADE SAG 18X100X1.27 (BLADE) ×2 IMPLANT
BLADE SAW RECIPROCATING 77.5 (BLADE) ×2 IMPLANT
BLADE SAW SGTL 13.0X1.19X90.0M (BLADE) ×2 IMPLANT
BNDG CMPR 9X6 STRL LF SNTH (GAUZE/BANDAGES/DRESSINGS) ×1
BNDG CMPR MED 15X6 ELC VLCR LF (GAUZE/BANDAGES/DRESSINGS) ×1
BNDG ELASTIC 6X15 VLCR STRL LF (GAUZE/BANDAGES/DRESSINGS) ×1 IMPLANT
BNDG ESMARK 6X9 LF (GAUZE/BANDAGES/DRESSINGS) ×2
BOWL SMART MIX CTS (DISPOSABLE) ×2 IMPLANT
CEMENT HV SMART SET (Cement) ×4 IMPLANT
CLEANER TIP ELECTROSURG 2X2 (MISCELLANEOUS) ×2 IMPLANT
CLOTH BEACON ORANGE TIMEOUT ST (SAFETY) ×2 IMPLANT
CLSR STERI-STRIP ANTIMIC 1/2X4 (GAUZE/BANDAGES/DRESSINGS) ×2 IMPLANT
CUFF TOURN SGL QUICK 34 (TOURNIQUET CUFF) ×2
CUFF TRNQT CYL 34X4X40X1 (TOURNIQUET CUFF) ×1 IMPLANT
DRAPE EXTREMITY T 121X128X90 (DRAPE) ×2 IMPLANT
DRAPE POUCH INSTRU U-SHP 10X18 (DRAPES) ×2 IMPLANT
DRAPE U-SHAPE 47X51 STRL (DRAPES) ×2 IMPLANT
DRSG PAD ABDOMINAL 8X10 ST (GAUZE/BANDAGES/DRESSINGS) ×2 IMPLANT
ELECT REM PT RETURN 9FT ADLT (ELECTROSURGICAL) ×2
ELECTRODE REM PT RTRN 9FT ADLT (ELECTROSURGICAL) ×1 IMPLANT
EVACUATOR 1/8 PVC DRAIN (DRAIN) ×2 IMPLANT
FACESHIELD LNG OPTICON STERILE (SAFETY) ×10 IMPLANT
GLOVE BIOGEL PI IND STRL 8 (GLOVE) ×1 IMPLANT
GLOVE BIOGEL PI INDICATOR 8 (GLOVE) ×1
GOWN STRL NON-REIN LRG LVL3 (GOWN DISPOSABLE) ×2 IMPLANT
HANDPIECE INTERPULSE COAX TIP (DISPOSABLE) ×2
HOOD PEEL AWAY FACE SHEILD DIS (HOOD) ×4 IMPLANT
IMMOBILIZER KNEE 20 (SOFTGOODS) ×2
IMMOBILIZER KNEE 20 THIGH 36 (SOFTGOODS) IMPLANT
KIT BASIN OR (CUSTOM PROCEDURE TRAY) ×2 IMPLANT
MANIFOLD NEPTUNE II (INSTRUMENTS) ×2 IMPLANT
NS IRRIG 1000ML POUR BTL (IV SOLUTION) ×2 IMPLANT
PACK ICE MAXI GEL EZY WRAP (MISCELLANEOUS) ×2 IMPLANT
PACK TOTAL JOINT (CUSTOM PROCEDURE TRAY) ×2 IMPLANT
PADDING CAST COTTON 6X4 STRL (CAST SUPPLIES) ×3 IMPLANT
POSITIONER SURGICAL ARM (MISCELLANEOUS) ×2 IMPLANT
SET HNDPC FAN SPRY TIP SCT (DISPOSABLE) ×1 IMPLANT
SPONGE GAUZE 4X4 12PLY (GAUZE/BANDAGES/DRESSINGS) ×2 IMPLANT
SPONGE LAP 18X18 X RAY DECT (DISPOSABLE) IMPLANT
SUCTION FRAZIER 12FR DISP (SUCTIONS) ×2 IMPLANT
SUT VIC AB 0 CT1 27 (SUTURE) ×4
SUT VIC AB 0 CT1 27XBRD ANTBC (SUTURE) IMPLANT
SUT VIC AB 2-0 CT1 27 (SUTURE) ×6
SUT VIC AB 2-0 CT1 TAPERPNT 27 (SUTURE) ×3 IMPLANT
SUT VIC AB 3-0 SH 18 (SUTURE) ×2 IMPLANT
SUT VIC AB 4-0 PS2 18 (SUTURE) ×2 IMPLANT
TOWEL OR 17X26 10 PK STRL BLUE (TOWEL DISPOSABLE) ×6 IMPLANT
TRAY FOLEY CATH 14FRSI W/METER (CATHETERS) ×2 IMPLANT
WATER STERILE IRR 1500ML POUR (IV SOLUTION) ×3 IMPLANT

## 2012-03-17 NOTE — Anesthesia Procedure Notes (Signed)
Spinal  Patient location during procedure: OR Start time: 03/17/2012 7:45 AM End time: 03/17/2012 7:50 AM Staffing Anesthesiologist: Lucille Passy F Performed by: anesthesiologist  Preanesthetic Checklist Completed: patient identified, site marked, surgical consent, pre-op evaluation, timeout performed, IV checked, risks and benefits discussed and monitors and equipment checked Spinal Block Patient position: sitting Prep: Betadine Patient monitoring: heart rate, continuous pulse ox and blood pressure Approach: midline Injection technique: single-shot Needle Needle type: Sprotte  Needle gauge: 22 G Needle length: 9 cm Additional Notes Expiration date of kit checked and confirmed. Patient tolerated procedure well, without complications. Negative heme/paresthesia Lot [id number] DOE [phone]

## 2012-03-17 NOTE — Progress Notes (Signed)
Utilization review completed.  

## 2012-03-17 NOTE — Op Note (Addendum)
DATE OF SURGERY:  03/17/2012 TIME: 9:28 AM  PATIENT NAME:  Jack [name]   AGE: 78 y.o.    PRE-OPERATIVE DIAGNOSIS:  LEFT KNEE Degenrative Joint Disease  POST-OPERATIVE DIAGNOSIS:  Same  PROCEDURE:  Procedure(s): TOTAL KNEE ARTHROPLASTY   SURGEON:  Eulas Post, MD   ASSISTANT:  Janace Litten, OPA-C, present and scrubbed throughout the case, critical for assistance with exposure, retraction, instrumentation, and closure.   OPERATIVE IMPLANTS: Depuy PFC Sigma, Posterior Stabilized.  Femur size 5, Tibia size 4, Patella size 41 mm 3-peg oval button, with a 10 mm polyethylene insert.   PREOPERATIVE INDICATIONS:  [name] is a 78 y.o. year old male with end stage bone on bone degenerative arthritis of the knee who failed conservative treatment, including injections, antiinflammatories, activity modification, and assistive devices, and had significant impairment of their activities of daily living, and elected for Total Knee Arthroplasty.   The risks, benefits, and alternatives were discussed at length including but not limited to the risks of infection, bleeding, nerve injury, stiffness, blood clots, the need for revision surgery, cardiopulmonary complications, among others, and they were willing to proceed.   OPERATIVE DESCRIPTION:  The patient was brought to the operative room and placed in a supine position.  General anesthesia was administered.  IV antibiotics were given.  The lower extremity was prepped and draped in the usual sterile fashion.  Time out was performed.  The leg was elevated and exsanguinated and the tourniquet was inflated.  Anterior quadriceps tendon splitting approach was performed.  The patella was everted and osteophytes were removed.  The anterior horn of the medial and lateral meniscus was removed. He had a very interesting pattern of wear, completely eroding the medial femoral condyle and trochlear groove, as well as completely medial wear of the patella.  The lateral side was intact.  The distal femur was opened with the drill and the intramedullary distal femoral cutting jig was utilized, set at 5 degrees resecting 10 mm off the distal femur.  Care was taken to protect the collateral ligaments.  Then the extramedullary tibial cutting jig was utilized making the appropriate cut using the anterior tibial crest as a reference building in appropriate posterior slope.  Care was taken during the cut to protect the medial and collateral ligaments.  The proximal tibia was removed along with the posterior horns of the menisci.  The PCL was sacrificed.    The extensor gap was measured and was approximately 10mm.    The distal femoral sizing jig was applied, taking care to avoid notching.  Then the 4-in-1 cutting jig was applied and the anterior and posterior femur was cut, along with the chamfer cuts.  All posterior osteophytes were removed.  The flexion gap was then measured and was symmetric with the extension gap.  I completed the distal femoral preparation using the appropriate jig to prepare the box.  The patella was then measured, and cut with the saw.  This measured 24 mm before the resection and then 14 mm afterwards, resecting 10mm. The size of the patella was between a 38 and a 41, and 41 foot easily, and so this was selected, although the 38 would've allowed more lateralization of the button, but the 41 fit the bone better.  The proximal tibia sized and prepared accordingly with the reamer and the punch, and then all components were trialed with the 10mm poly insert.  The knee was found to have excellent balance and full motion. He had a  tendency to be tight in extension, both preoperatively, as well as with the trials in place. I did not feel however that resecting more bone would be appropriate.    The above named components were then cemented into place and all excess cement was removed.  The trial polyethylene component was in place during  cementation, and then was exchanged for the real polyethylene component.    The knee was easily taken through a range of motion and the patella tracked well, although I didn't need to place a single stitch into the retinaculum in order to maintain appropriate patellar tracking.  The knee irrigated copiously and the parapatellar and subcutaneous tissue closed with vicryl, and monocryl with steri strips for the skin.  The wounds were injected with marcaine, and dressed with sterile gauze and the tourniquet released and the patient was awakened and returned to the PACU in stable and satisfactory condition.  There were no complications.  Total tourniquet time was 80 minutes.

## 2012-03-17 NOTE — Progress Notes (Signed)
CSW consulted for SNF placement. Met with pt/family to assist with d/c needs. Pt states he plans to return home following hospital d/c. He is interested in [facility] services. RNCM will assist with d/c planning to home. CSW is available if plan changes and SNF placement is needed.  Cori Razor LCSW [phone]

## 2012-03-17 NOTE — H&P (Signed)
PREOPERATIVE H&P  Chief Complaint: LEFT KNEE Degenrative Joint Disease  HPI: JAMONE MEDDINGS is a 78 y.o. male who presents for preoperative history and physical with a diagnosis of LEFT KNEE Degenrative Joint Disease. Symptoms are rated as moderate to severe, and have been worsening.  This is significantly impairing activities of daily living.  He has elected for surgical management.   Past Medical History  Diagnosis Date  . GERD (gastroesophageal reflux disease)   . Osteoarthritis   . Alcohol abuse     in recovery 2006  . 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 wine only  . Irritable bowel   . Hyperlipidemia   . Insomnia   . PAC (premature atrial contraction)     stable (ruled out for a fib)  . Prostatitis     chronic bacterial  . Pseudogout   . Sinus problem     chronic  . Cellulitis     2nd to T. Pedis(not at present)  . Hearing loss 03-10-12    bilateral hearing aids  . Complication of anesthesia 03-10-12    after shoulder  surgery -difficulty awakening,breathing problems  . Emphysema 03-10-12    tx. inhalers, uses steam room frequently  . Cramping of feet 03-10-12    cramping of both legs and hands occ.  . Non Hodgkin's lymphoma 03-10-12     '07-1 yr. in remission(Shadad)-not seeing now   Past Surgical History  Procedure Date  . Esophagogastroduodenoscopy 01-27-02  . Inguinal hernia repair 1979  . Torn biceps repair 03-10-12    Left rotator cuff repair  . Hernia repair   . Cataract surgery 03-10-12    03-04-12(right)/ (03-11-12-left)   History   Social History  . Marital Status: Widowed    Spouse Name: N/A    Number of Children: 3  . Years of Education: 18   Occupational History  . newspaper Programmer, multimedia     [employer]   Social History Main Topics  . Smoking status: Former Smoker    Types: Cigarettes    Quit date: 06/24/1978  . Smokeless tobacco: Never Used  . Alcohol Use: 2.5 oz/week    5 drink(s) per week     Currently only  drinks wine - in moderation. He is cutting back-past hx. ETOH abuse  . Drug Use: No  . Sexually Active: Yes   Other Topics Concern  . None   Social History Narrative   Loss adjuster, chartered. married '56- widowed '07. 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 lives alone: completed  ren[place] but continues to [place] improvement ('12), keeps up his beach house; takes his extended family on great trips - [place] and [place] July '12. Socially active.  Active in AAEnd of Life issues: He does want CPR; no prolonged intubation; no futile or heroic measure to maintain him if the quality of life isn't good. HCPOA  Son - Vandon Buchmann ([phone].   Family History  Problem Relation Age of Onset  . Coronary artery disease Father   . Heart attack Father   . Cancer Brother     colon  . Other Other     TB   Allergies  Allergen Reactions  . Amoxicillin     REACTION: rash  . Penicillins Rash   Prior to Admission medications   Medication Sig Start Date End Date Taking? Authorizing Provider  albuterol (PROVENTIL HFA;VENTOLIN HFA) 108 (90 BASE)  MCG/ACT inhaler Inhale 2 puffs into the lungs every 6 (six) hours as needed. For shortness of breath   Yes Historical Provider, MD  alfuzosin (UROXATRAL) 10 MG 24 hr tablet Take 10 mg by mouth every morning.   Yes Historical Provider, MD  aspirin 81 MG tablet Take 81 mg by mouth daily.     Yes Historical Provider, MD  Besifloxacin HCl (BESIVANCE) 0.6 % SUSP Apply 1 drop to eye 3 (three) times daily. PT BROUGHT OWN EYE DROP FROM HOME   Yes Historical Provider, MD  celecoxib (CELEBREX) 100 MG capsule Take 100 mg by mouth 2 (two) times daily.   Yes Historical Provider, MD  Difluprednate (DUREZOL) 0.05 % EMUL Apply 1 drop to eye 3 (three) times daily. Pt brought his own eye drops from home   Yes Historical Provider, MD  diphenoxylate-atropine (LOMOTIL) 2.5-0.025 MG per tablet  Take 1 tablet by mouth 4 (four) times daily as needed. For loose stool   Yes Historical Provider, MD  dorzolamide (TRUSOPT) 2 % ophthalmic solution Place 1 drop into both eyes 3 (three) times daily. NEW PRESCRIPTION, PT WILL NEED FILLED FOR ADMISSION   Yes Historical Provider, MD  esomeprazole (NEXIUM) 40 MG capsule Take 40 mg by mouth daily before breakfast.   Yes Historical Provider, MD  fexofenadine (ALLEGRA) 180 MG tablet Take 180 mg by mouth daily as needed. For allergies   Yes Historical Provider, MD  Fluticasone-Salmeterol (ADVAIR) 250-50 MCG/DOSE AEPB Inhale 1 puff into the lungs every 12 (twelve) hours.   Yes Historical Provider, MD  Ginkgo Biloba 40 MG TABS Take by mouth 2 (two) times daily as needed. Leg  cramps   Yes Historical Provider, MD  glucosamine-chondroitin 500-400 MG tablet Take 1 tablet by mouth daily.   Yes Historical Provider, MD  HYDROcodone-acetaminophen (NORCO/VICODIN) 5-325 MG per tablet Take 1 tablet by mouth every 6 (six) hours as needed. For pain   Yes Historical Provider, MD  hyoscyamine (ANASPAZ) 0.125 MG TBDP dissolve 1 tablet under the tongue every 2 hours if needed for bloating gas and CRAMPS 12/10/10  Yes Jacques Navy, MD  magnesium oxide (MAG-OX) 400 (241.3 MG) MG tablet take 1 tablet by mouth once daily 02/08/12  Yes Jacques Navy, MD  metoprolol succinate (TOPROL-XL) 50 MG 24 hr tablet Take 75 mg by mouth every morning. Take with or immediately following a meal.   Yes Historical Provider, MD  nepafenac (NEVANAC) 0.1 % ophthalmic suspension Place 3 drops into both eyes 3 (three) times daily. PT BROUGHT HIS OWN FROM HOMW   Yes Historical Provider, MD  potassium chloride SA (K-DUR,KLOR-CON) 20 MEQ tablet Take 20 mEq by mouth every morning.   Yes Historical Provider, MD  quiNINE (QUALAQUIN) 324 MG capsule Take 324 mg by mouth 2 (two) times daily.   Yes Historical Provider, MD  simvastatin (ZOCOR) 20 MG tablet take 1 tablet by mouth at bedtime 12/21/11  Yes  Jacques Navy, MD  temazepam (RESTORIL) 30 MG capsule Take 30 mg by mouth at bedtime.   Yes Historical Provider, MD  triamcinolone cream (KENALOG) 0.1 % Apply 1 application topically 2 (two) times daily. To rash on skin. Avoid face.   Yes Historical Provider, MD  VIAGRA 100 MG tablet take 1 tablet by mouth if needed for ERECTILE DYSFUNCTION 1/2 OR 1 TABLET AS NEEDED 12/01/11   Jacques Navy, MD     Positive ROS: All other systems have been reviewed and were otherwise negative with the exception of those  mentioned in the HPI and as above.  Physical Exam: General: Alert, no acute distress Cardiovascular: No pedal edema Respiratory: No cyanosis, no use of accessory musculature GI: No organomegaly, abdomen is soft and non-tender Skin: No lesions in the area of chief complaint Neurologic: Sensation intact distally Psychiatric: Patient is competent for consent with normal mood and affect Lymphatic: No axillary or cervical lymphadenopathy  MUSCULOSKELETAL: left knee with mild flexion contracture, varus alignment, 5-100 deg, crepitance.  Assessment: LEFT KNEE Degenrative Joint Disease  Plan: Plan for Procedure(s): TOTAL KNEE ARTHROPLASTY  The risks benefits and alternatives were discussed with the patient including but not limited to the risks of nonoperative treatment, versus surgical intervention including infection, bleeding, nerve injury,  blood clots, cardiopulmonary complications, morbidity, mortality, among others, and they were willing to proceed.   Matison Nuccio P, MD Cell [phone] Pager [phone]  03/17/2012 7:31 AM

## 2012-03-17 NOTE — Transfer of Care (Signed)
Immediate Anesthesia Transfer of Care Note  Patient: Jack [name]  Procedure(s) Performed: Procedure(s) (LRB) with comments: TOTAL KNEE ARTHROPLASTY (Left)  Patient Location: PACU  Anesthesia Type: Regional  Level of Consciousness: awake, alert  and oriented  Airway & Oxygen Therapy: Patient Spontanous Breathing and Patient connected to face mask oxygen  Post-op Assessment: Report given to PACU RN and Post -op Vital signs reviewed and stable  Post vital signs: Reviewed and stable  Complications: No apparent anesthesia complications

## 2012-03-17 NOTE — Progress Notes (Signed)
ANTICOAGULATION CONSULT NOTE - Initial Consult  Pharmacy Consult for Warfarin Indication: VTE prophylaxis  Allergies  Allergen Reactions  . Amoxicillin     REACTION: rash  . Penicillins Rash   Patient Measurements: Height: 5\' 11"  (180.3 cm) Weight: 198 lb (89.812 kg) IBW/kg (Calculated) : 75.3   Vital Signs: Temp: 97.3 F (36.3 C) (09/24 1430) Temp src: Oral (09/24 1430) BP: 144/70 mmHg (09/24 1430) Pulse Rate: 64  (09/24 1430)  Labs:  Basename 03/17/12 1448  HGB --  HCT --  PLT --  APTT --  LABPROT 13.0  INR 0.99  HEPARINUNFRC --  CREATININE --  CKTOTAL --  CKMB --  TROPONINI --    Estimated Creatinine Clearance: 69 ml/min (by C-G formula based on Cr of 0.91).   Medical History: Past Medical History  Diagnosis Date  . GERD (gastroesophageal reflux disease)   . Osteoarthritis   . Alcohol abuse     in recovery 2006  . 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 wine only  . Irritable bowel   . Hyperlipidemia   . Insomnia   . PAC (premature atrial contraction)     stable (ruled out for a fib)  . Prostatitis     chronic bacterial  . Pseudogout   . Sinus problem     chronic  . Cellulitis     2nd to T. Pedis(not at present)  . Hearing loss 03-10-12    bilateral hearing aids  . Complication of anesthesia 03-10-12    after shoulder  surgery -difficulty awakening,breathing problems  . Emphysema 03-10-12    tx. inhalers, uses steam room frequently  . Cramping of feet 03-10-12    cramping of both legs and hands occ.  . Non Hodgkin's lymphoma 03-10-12     '07-1 yr. in remission([facility])-not seeing now  . Osteoarthritis of left knee 03/17/2012    Medications:  Scheduled:    . acetaminophen  1,000 mg Intravenous Q6H  . alfuzosin  10 mg Oral Daily  . Besifloxacin HCl  1 drop Ophthalmic TID  . celecoxib  100 mg Oral BID  . Difluprednate  1 drop Ophthalmic TID  . docusate sodium  100 mg Oral BID  . dorzolamide  1 drop Both  Eyes TID  . enoxaparin (LOVENOX) injection  30 mg Subcutaneous Q12H  . Fluticasone-Salmeterol  1 puff Inhalation BID  . HYDROmorphone      . loratadine  10 mg Oral Daily  . magnesium oxide  400 mg Oral Daily  . metoprolol succinate  75 mg Oral q morning - 10a  . nepafenac  1 drop Both Eyes TID  . pantoprazole  80 mg Oral Q1200  . potassium chloride SA  20 mEq Oral q morning - 10a  . senna  1 tablet Oral BID  . simvastatin  20 mg Oral q1800  . temazepam  30 mg Oral QHS  . triamcinolone cream  1 application Topical BID  . vancomycin  1,000 mg Intravenous 60 min Pre-Op  . vancomycin  1,000 mg Intravenous Q12H  . DISCONTD: acetaminophen  15 mg/kg Intravenous Q6H  . DISCONTD: quiNINE  324 mg Oral BID    Assessment:  82 yom s/p L TKR, begin Warfarin for VTE prophylaxis.  Recent cataract surgery and Lasik; using multiple ophthalmic drops.  Ginkgo noted as taken at home, will need to stop while using Warfarin. Also on Celebrex this admission, low  risk of GI bleed compared to other NSAIDs  Lovenox 30mg  SQ q12 begininning 9/25; discontinue when INR >/= 2.0  Goal of Therapy:  INR 2-3 Monitor platelets by anticoagulation protocol: Yes   Plan:  Warfarin 5mg  today at 1800 Daily PT/INR ordered. Provide Coumadin education booklet, encourage patient and family to watch educational video.  Otho Bellows PharmD Pager [phone] 03/17/2012,3:21 PM

## 2012-03-17 NOTE — Anesthesia Preprocedure Evaluation (Addendum)
Anesthesia Evaluation  Patient identified by MRN, date of birth, ID band Patient awake    Reviewed: Allergy & Precautions, H&P , NPO status , Patient's Chart, lab work & pertinent test results  History of Anesthesia Complications (+) PROLONGED EMERGENCE  Airway Mallampati: II TM Distance: >3 FB Neck ROM: Full    Dental  (+) Teeth Intact, Caps and Dental Advisory Given   Pulmonary shortness of breath and with exertion, COPD COPD inhaler,  breath sounds clear to auscultation  Pulmonary exam normal       Cardiovascular hypertension, Pt. on medications + dysrhythmias Rhythm:Regular Rate:Normal     Neuro/Psych  Neuromuscular disease negative neurological ROS  negative psych ROS   GI/Hepatic negative GI ROS, Neg liver ROS, GERD-  Medicated,(+)     substance abuse (Hx ETOH abuse in remote past)  alcohol use,   Endo/Other  negative endocrine ROS  Renal/GU negative Renal ROS  negative genitourinary   Musculoskeletal negative musculoskeletal ROS (+)   Abdominal   Peds  Hematology negative hematology ROS (+) Hx NonHodgkins lymphoma, 1 yr in remission   Anesthesia Other Findings   Reproductive/Obstetrics negative OB ROS                          Anesthesia Physical Anesthesia Plan  ASA: II  Anesthesia Plan: Spinal   Post-op Pain Management:    Induction:   Airway Management Planned: Simple Face Mask  Additional Equipment:   Intra-op Plan:   Post-operative Plan: Extubation in OR  Informed Consent: I have reviewed the patients History and Physical, chart, labs and discussed the procedure including the risks, benefits and alternatives for the proposed anesthesia with the patient or authorized representative who has indicated his/her understanding and acceptance.   Dental advisory given  Plan Discussed with: CRNA  Anesthesia Plan Comments:         Anesthesia Quick Evaluation

## 2012-03-17 NOTE — Progress Notes (Signed)
ANTICOAGULATION NOTE   Indication: VTE prophylaxis  Patient Measurements: Height: 5\' 11"  (180.3 cm) Weight: 198 lb (89.812 kg) IBW/kg (Calculated) : 75.3   Labs:  Basename 03/17/12 1448  HGB --  HCT --  PLT --  APTT --  LABPROT 13.0  INR 0.99  HEPARINUNFRC --  CREATININE --  CKTOTAL --  CKMB --  TROPONINI --    Estimated Creatinine Clearance: 69 ml/min (by C-G formula based on Cr of 0.91).  Assessment:  82 yom s/p L TKR.  Warfarin ordered for VTE prophylaxis, but pt refusing.  Per MD orders, continue only on Lovenox 30mg  SQ q12 begininning 9/25.    Plan:  Lovenox 30mg  SubQ Q12h Warfarin per pharmacy consult discontinued.   Lynann Beaver PharmD, BCPS Pager [phone] 03/17/2012 7:03 PM

## 2012-03-18 ENCOUNTER — Encounter (HOSPITAL_COMMUNITY): Payer: Self-pay | Admitting: Orthopedic Surgery

## 2012-03-18 DIAGNOSIS — M1710 Unilateral primary osteoarthritis, unspecified knee: Secondary | ICD-10-CM

## 2012-03-18 DIAGNOSIS — R42 Dizziness and giddiness: Secondary | ICD-10-CM

## 2012-03-18 LAB — CBC
Hemoglobin: 11.7 g/dL — ABNORMAL LOW (ref 13.0–17.0)
MCH: 30.7 pg (ref 26.0–34.0)
MCV: 90.6 fL (ref 78.0–100.0)
Platelets: 126 10*3/uL — ABNORMAL LOW (ref 150–400)
RBC: 3.81 MIL/uL — ABNORMAL LOW (ref 4.22–5.81)
WBC: 7.1 10*3/uL (ref 4.0–10.5)

## 2012-03-18 LAB — BASIC METABOLIC PANEL
CO2: 27 mEq/L (ref 19–32)
Calcium: 8.5 mg/dL (ref 8.4–10.5)
Chloride: 96 mEq/L (ref 96–112)
Glucose, Bld: 128 mg/dL — ABNORMAL HIGH (ref 70–99)
Sodium: 131 mEq/L — ABNORMAL LOW (ref 135–145)

## 2012-03-18 NOTE — Anesthesia Postprocedure Evaluation (Signed)
Anesthesia Post Note  Patient: Jack [name]  Procedure(s) Performed: Procedure(s) (LRB): TOTAL KNEE ARTHROPLASTY (Left)  Anesthesia type: General  Patient location: PACU  Post pain: Pain level controlled  Post assessment: Post-op Vital signs reviewed  Last Vitals:  Filed Vitals:   03/18/12 0947  BP: 110/67  Pulse: 76  Temp: 36.7 C  Resp: 15    Post vital signs: Reviewed  Level of consciousness: sedated  Complications: No apparent anesthesia complications

## 2012-03-18 NOTE — Progress Notes (Addendum)
03/18/12 1300  PT Visit Information  Last PT Received On 03/18/12  Assistance Needed +1  PT Time Calculation  PT Start Time 1304  PT Stop Time 1338  PT Time Calculation (min) 34 min  Subjective Data  Subjective i want to walk   Patient Stated Goal home  Precautions  Precautions Knee  Required Braces or Orthoses Knee Immobilizer - Left  Knee Immobilizer - Left Discontinue once straight leg raise with < 10 degree lag  Restrictions  LLE Weight Bearing WBAT  Cognition  Overall Cognitive Status Appears within functional limits for tasks assessed/performed  Arousal/Alertness Awake/alert  Orientation Level Appears intact for tasks assessed  Behavior During Session [facility] for tasks performed  Bed Mobility  Bed Mobility Sit to Supine  Supine to Sit 4: Min assist  Details for Bed Mobility Assistance min with left LE and cues for technique  Transfers  Transfers Sit to Stand;Stand to Sit  Sit to Stand 4: Min assist  Stand to Sit 4: Min assist  Details for Transfer Assistance cues for hand and LLE postion  Ambulation/Gait  Ambulation/Gait Assistance 4: Min assist  Ambulation Distance (Feet) 20 Feet  Assistive device Rolling walker  Ambulation/Gait Assistance Details cues for sequence and breathing, RW position from self;   Gait Pattern Step-to pattern  Total Joint Exercises  Ankle Circles/Pumps AROM;Both;10 reps  Heel Slides AAROM;Left;10 reps  Straight Leg Raises AAROM;Left;10 reps  PT - End of Session  Equipment Utilized During Treatment Left knee immobilizer  Activity Tolerance Patient tolerated treatment well  Patient left in bed;with call bell/phone within reach;with nursing in room;with family/visitor present  PT - Assessment/Plan  Comments on Treatment Session pt progressing, doing better overall this pm; significant wheezing but only slightly worse than baseline per family, pt used inhaler and felt better; pt requiring frequent rest breaks during there ex and gait   PT Plan  Discharge plan remains appropriate;Frequency remains appropriate  PT Frequency 7X/week  Follow Up Recommendations Home health PT;Supervision/Assistance - 24 hour  Equipment Recommended None recommended by PT  Acute Rehab PT Goals  Time For Goal Achievement 03/25/12  Potential to Achieve Goals Good  PT General Charges  $$ ACUTE PT VISIT 1 Procedure  PT Treatments  $Gait Training 8-22 mins  $Therapeutic Exercise 8-22 mins

## 2012-03-18 NOTE — Progress Notes (Signed)
Patient ID: Jack [name], male   DOB: 1928-11-05, 78 y.o.   MRN: [id number]     Subjective:  Patient reports pain as mild to moderate.  States the he is better today and is starting to feel his bowels wake up.  States that he was in the CPM for about 3-4 hours yesterday  Objective:   VITALS:   Filed Vitals:   03/18/12 0213 03/18/12 0636 03/18/12 0833 03/18/12 0947  BP: 146/78 102/68 118/66 110/67  Pulse: 95 72 73 76  Temp: 98.5 F (36.9 C) 98.5 F (36.9 C)  98.1 F (36.7 C)  TempSrc:    Oral  Resp: 16 16  15   Height:      Weight:      SpO2: 97% 97%      Sensation intact distally Dorsiflexion/Plantar flexion intact Incision: dressing C/D/I and no drainage  LABS  Results for orders placed during the hospital encounter of 03/17/12 (from the past 24 hour(s))  PROTIME-INR     Status: Normal   Collection Time   03/17/12  2:48 PM      Component Value Range   Prothrombin Time 13.0  11.6 - 15.2 seconds   INR 0.99  0.00 - 1.49  CBC     Status: Abnormal   Collection Time   03/18/12  4:00 AM      Component Value Range   WBC 7.1  4.0 - 10.5 K/uL   RBC 3.81 (*) 4.22 - 5.81 MIL/uL   Hemoglobin 11.7 (*) 13.0 - 17.0 g/dL   HCT 78.2 (*) 95.6 - 21.3 %   MCV 90.6  78.0 - 100.0 fL   MCH 30.7  26.0 - 34.0 pg   MCHC 33.9  30.0 - 36.0 g/dL   RDW 08.6  57.8 - 46.9 %   Platelets 126 (*) 150 - 400 K/uL  BASIC METABOLIC PANEL     Status: Abnormal   Collection Time   03/18/12  4:00 AM      Component Value Range   Sodium 131 (*) 135 - 145 mEq/L   Potassium 4.2  3.5 - 5.1 mEq/L   Chloride 96  96 - 112 mEq/L   CO2 27  19 - 32 mEq/L   Glucose, Bld 128 (*) 70 - 99 mg/dL   BUN 11  6 - 23 mg/dL   Creatinine, Ser 6.29  0.50 - 1.35 mg/dL   Calcium 8.5  8.4 - 52.8 mg/dL   GFR calc non Af Amer 81 (*) >90 mL/min   GFR calc Af Amer >90  >90 mL/min    X-ray Knee Left Port  03/17/2012  *RADIOLOGY REPORT*  Clinical Data: Postop left total knee arthroplasty.  PORTABLE LEFT KNEE - 1-2 VIEW   Comparison: Preoperative radiographs 10/15/2011.  Findings: The patient has undergone interval left total knee arthroplasty.  The hardware appears well positioned.  There is no evidence of acute fracture or dislocation.  There is air within the joint and soft tissues surrounding the knee.  IMPRESSION: Interval left total knee arthroplasty without demonstrated complication.   Original Report Authenticated By: Gerrianne Scale, M.D.     Assessment/Plan: 1 Day Post-Op   Principal Problem:  *Osteoarthritis of left knee Active Problems:  Dizzy   Advance diet Up with therapy Continue PT/OT Will plan for DC on Friday, patient wants to go home.   Naina Sleeper P 03/18/2012, 11:23 AM   Teryl Lucy, MD Cell [phone] Pager [phone]

## 2012-03-18 NOTE — Progress Notes (Signed)
Subjective: Came by on social visit to Mr. Tester 1 day po9st-op left TKR. As I came into the room he was c/o feeling dizzy and a bit off. He denies any chest pain, SOB, denies focal weakness or paresthesia. He is oriented.  Objective: Lab: Lab Results  Component Value Date   WBC 7.1 03/18/2012   HGB 11.7* 03/18/2012   HCT 34.5* 03/18/2012   MCV 90.6 03/18/2012   PLT 126* 03/18/2012   BMET    Component Value Date/Time   NA 131* 03/18/2012 0400   K 4.2 03/18/2012 0400   CL 96 03/18/2012 0400   CO2 27 03/18/2012 0400   GLUCOSE 128* 03/18/2012 0400   BUN 11 03/18/2012 0400   CREATININE 0.82 03/18/2012 0400   CALCIUM 8.5 03/18/2012 0400   GFRNONAA 81* 03/18/2012 0400   GFRAA >90 03/18/2012 0400     Imaging:  Scheduled Meds:   . acetaminophen  1,000 mg Intravenous Q6H  . alfuzosin  10 mg Oral Daily  . Besifloxacin HCl  1 drop Ophthalmic TID  . celecoxib  100 mg Oral BID  . Difluprednate  1 drop Ophthalmic TID  . docusate sodium  100 mg Oral BID  . dorzolamide  1 drop Both Eyes TID  . enoxaparin (LOVENOX) injection  30 mg Subcutaneous Q12H  . Fluticasone-Salmeterol  1 puff Inhalation BID  . HYDROmorphone      . loratadine  10 mg Oral Daily  . magnesium oxide  400 mg Oral Daily  . metoprolol succinate  75 mg Oral q morning - 10a  . nepafenac  1 drop Both Eyes TID  . pantoprazole  80 mg Oral Q1200  . potassium chloride SA  20 mEq Oral q morning - 10a  . quiNINE  324 mg Oral BID  . senna  1 tablet Oral BID  . simvastatin  20 mg Oral q1800  . temazepam  30 mg Oral QHS  . triamcinolone cream  1 application Topical BID  . vancomycin  1,000 mg Intravenous Q12H  . DISCONTD: acetaminophen  15 mg/kg Intravenous Q6H  . DISCONTD: enoxaparin (LOVENOX) injection  30 mg Subcutaneous Q12H  . DISCONTD: patient's guide to using coumadin book   Does not apply Once  . DISCONTD: quiNINE  324 mg Oral BID  . DISCONTD: warfarin  5 mg Oral ONCE-1800  . DISCONTD: warfarin   Does not apply Once  .  DISCONTD: Warfarin - Pharmacist Dosing Inpatient   Does not apply q1800   Continuous Infusions:   . 0.45 % NaCl with KCl 20 mEq / L Stopped (03/18/12 0700)  . DISCONTD: lactated ringers     PRN Meds:.acetaminophen, acetaminophen, albuterol, alum & mag hydroxide-simeth, diphenhydrAMINE, diphenoxylate-atropine, HYDROmorphone (DILAUDID) injection, hyoscyamine, menthol-cetylpyridinium, methocarbamol (ROBAXIN) IV, methocarbamol, metoCLOPramide (REGLAN) injection, metoCLOPramide, ondansetron (ZOFRAN) IV, ondansetron, oxyCODONE, phenol, polyethylene glycol, sorbitol, DISCONTD: Ginkgo Biloba, DISCONTD:  HYDROmorphone (DILAUDID) injection DISCONTD:  HYDROmorphone (DILAUDID) injection, DISCONTD: promethazine, DISCONTD: sodium chloride irrigation, DISCONTD: sodium chloride irrigation   Physical Exam: Filed Vitals:   03/18/12 0833  BP: 118/66  Pulse: 73  Temp:   Resp:     Intake/Output Summary (Last 24 hours) at 03/18/12 0841 Last data filed at 03/18/12 0636  Gross per 24 hour  Intake 3831.25 ml  Output   2025 ml  Net 1806.25 ml   Gen'l WNWD white man laying in bed in no distress but uncomfortable with dizziness HEENT- C&S clear Cor- 2+ radial pulse that is regular and a normal rate, Heart sounds distant Pulm -  no increased WOB, no rales or wheezes Neuro - A&O x 3, CN - normal facial symmetry, PERRLA, EOMI, no nystagmus, no visual field cuts, normal shoulder movement. MS. Normal grip strength, able to lift right leg off the bed, moves toes bilateral, no tremor, normal rapid finger movement.       Assessment/Plan: 1. Ortho - POD #1 - pain is controlled. Hgb normal. He did receive Oxycodone 10 mg about 1 hr ago.  2. Pulm - stable, normal respirations  3. Cardiac - normal exam and no  Chest pain  4. Neuro - non-focal exam. Suspect he is feeling off and dizzy due to narcotics Plan - minimize narcotics as much as possible.  Will check on him in AM.   Illene Regulus McIntosh IM (o)  [phone]; (c) [phone] Call-grp - Patsi Sears IM Tele: [phone]  03/18/2012, 8:40 AM

## 2012-03-18 NOTE — Evaluation (Signed)
Physical Therapy Evaluation Patient Details Name: Jack [name] MRN: [id number] DOB: 10-07-1928 Today's Date: 03/18/2012 Time: 2130-8657 PT Time Calculation (min): 22 min  PT Assessment / Plan / Recommendation Clinical Impression  pt is s/p Left TKA and will benefit from PT to maximize independence for home with family support    PT Assessment  Patient needs continued PT services    Follow Up Recommendations  Home health PT    Barriers to Discharge        Equipment Recommendations  None recommended by PT    Recommendations for Other Services     Frequency 7X/week    Precautions / Restrictions Precautions Precautions: Knee Required Braces or Orthoses: Knee Immobilizer - Left Knee Immobilizer - Left: Discontinue once straight leg raise with < 10 degree lag Restrictions LLE Weight Bearing: Weight bearing as tolerated   Pertinent Vitals/Pain sats 95% on RA, HR 89 Pt with significant wheezing     Mobility  Bed Mobility Bed Mobility: Supine to Sit Supine to Sit: 4: Min assist Details for Bed Mobility Assistance: min with left LE and cues for technique Transfers Transfers: Sit to Stand;Stand to Sit Sit to Stand: 4: Min assist;From bed Stand to Sit: 4: Min assist;3: Mod assist;To chair/3-in-1 Details for Transfer Assistance: cues for hand and LLE postion Ambulation/Gait Ambulation/Gait Assistance: 4: Min assist Ambulation Distance (Feet): 5 Feet Assistive device: Rolling walker Ambulation/Gait Assistance Details: cues for sequence and breathing, RW position from self;   Gait Pattern: Step-to pattern General Gait Details: pt feeling weak, denies dizziness, wheezing    Exercises Total Joint Exercises Ankle Circles/Pumps: AROM;Both;10 reps Quad Sets: AROM;5 reps;Left   PT Diagnosis: Difficulty walking  PT Problem List: Decreased range of motion;Decreased activity tolerance;Decreased balance;Decreased mobility;Decreased knowledge of use of DME;Decreased strength PT  Treatment Interventions: DME instruction;Gait training;Functional mobility training;Therapeutic activities;Therapeutic exercise;Patient/family education   PT Goals Acute Rehab PT Goals Time For Goal Achievement: 03/25/12 Potential to Achieve Goals: Good  Visit Information  Last PT Received On: 03/18/12 Assistance Needed: +1    Subjective Data  Subjective: I am better Patient Stated Goal: home   Prior Functioning  Home Living Lives With: Alone Available Help at Discharge: Family Type of Home: House Home Access: Ramped entrance Home Layout: Two level;Able to live on main level with bedroom/bathroom Bathroom Toilet: Handicapped height Home Adaptive Equipment: Walker - rolling Prior Function Level of Independence: Independent Able to Take Stairs?: Yes Communication Communication: No difficulties    Cognition  Overall Cognitive Status: Appears within functional limits for tasks assessed/performed Arousal/Alertness: Awake/alert Orientation Level: Appears intact for tasks assessed Behavior During Session: [facility] for tasks performed    Extremity/Trunk Assessment Right Upper Extremity Assessment RUE ROM/Strength/Tone: [facility] for tasks assessed Left Upper Extremity Assessment LUE ROM/Strength/Tone: WFL for tasks assessed Right Lower Extremity Assessment RLE ROM/Strength/Tone: [facility] for tasks assessed Left Lower Extremity Assessment LLE ROM/Strength/Tone: Deficits;Due to pain LLE ROM/Strength/Tone Deficits: limited flexion due to pain; ankle grossly WFL; able to assist with SLR   Balance    End of Session PT - End of Session Activity Tolerance: Patient tolerated treatment well;Patient limited by fatigue Patient left: in chair;with call bell/phone within reach;with family/visitor present;with nursing in room Nurse Communication: Mobility status CPM Left Knee CPM Left Knee: Off  GP     [facility] 03/18/2012, 10:37 AM

## 2012-03-18 NOTE — Care Management Note (Unsigned)
    Page 1 of 2   03/18/2012     4:12:18 PM   CARE MANAGEMENT NOTE 03/18/2012  Patient:  Jack [name], Jack [name]   Account Number:  [id number]  Date Initiated:  03/18/2012  Documentation initiated by:  Colleen Can  Subjective/Objective Assessment:   dx degenerative  joint disease knee-left; total knee replacemnt    [facility] referral from doctor's office     Action/Plan:   CM spoke with patient and grand daughter. Plans are for patient to go to his grand daughter's home where she will be caregiver. States he already has bsc and walker. States cpm was arranged prior to his hospital admission.   Anticipated DC Date:  03/20/2012   Anticipated DC Plan:  HOME W HOME HEALTH SERVICES  In-house referral  Clinical Social Worker      DC Planning Services  CM consult      [facility] Choice  HOME HEALTH   Choice offered to / List presented to:  C-1 Patient   DME arranged  NA      DME agency  NA     HH arranged  HH-2 PT  HH-3 OT  HH-1 RN      [facility] agency  [facility]   Status of service:  In process, will continue to follow Medicare Important Message given?   (If response is "NO", the following Medicare IM given date fields will be blank) Date Medicare IM given:   Date Additional Medicare IM given:    Discharge Disposition:    Per UR Regulation:    If discussed at Long Length of Stay Meetings, dates discussed:    Comments:  03/18/2012 Raynelle Bring BSN CCM [phone] Requesting Genevieve Norlander for [facility] services. CM will follow for [facility] needs.

## 2012-03-19 ENCOUNTER — Telehealth: Payer: Self-pay | Admitting: Oncology

## 2012-03-19 LAB — CBC
HCT: 33.7 % — ABNORMAL LOW (ref 39.0–52.0)
MCH: 30.6 pg (ref 26.0–34.0)
MCV: 90.6 fL (ref 78.0–100.0)
RBC: 3.72 MIL/uL — ABNORMAL LOW (ref 4.22–5.81)
WBC: 8.1 10*3/uL (ref 4.0–10.5)

## 2012-03-19 NOTE — Evaluation (Signed)
Occupational Therapy Evaluation Patient Details Name: Jack [name] MRN: [id number] DOB: 07/11/1928 Today's Date: 03/19/2012 Time: 5621-3086 OT Time Calculation (min): 36 min  OT Assessment / Plan / Recommendation Clinical Impression  Pt s/p L TKA and displays decreased activity tolerance, increase pain, and overall decreased independence with ADL. Will benefit from skilled OT services to improve ADL independence.     OT Assessment  Patient needs continued OT Services    Follow Up Recommendations  Home health OT;Supervision/Assistance - 24 hour;Skilled nursing facility;Other (comment) (HH aide if home, Depends on progress, open to SNF if needed)    Barriers to Discharge      Equipment Recommendations  None recommended by PT;None recommended by OT    Recommendations for Other Services    Frequency  Min 2X/week    Precautions / Restrictions Precautions Precautions: Knee Required Braces or Orthoses: Knee Immobilizer - Left Knee Immobilizer - Left: Discontinue once straight leg raise with < 10 degree lag Restrictions Weight Bearing Restrictions: No LLE Weight Bearing: Weight bearing as tolerated        ADL  Eating/Feeding: Simulated;Independent Where Assessed - Eating/Feeding: Bed level Grooming: Simulated;Wash/dry face;Set up Where Assessed - Grooming: Supported sitting Upper Body Bathing: Simulated;Chest;Right arm;Left arm;Abdomen;Supervision/safety;Set up Where Assessed - Upper Body Bathing: Unsupported sitting Lower Body Bathing: Simulated;Moderate assistance Where Assessed - Lower Body Bathing: Supported sit to stand Upper Body Dressing: Simulated;Minimal assistance Where Assessed - Upper Body Dressing: Unsupported sitting Lower Body Dressing: Simulated;Maximal assistance Where Assessed - Lower Body Dressing: Supported sit to stand Toilet Transfer: Performed;Moderate assistance Toilet Transfer Method: Stand pivot Toilet Transfer Equipment: Bedside commode Toileting  - Clothing Manipulation and Hygiene: Simulated;Maximal assistance Where Assessed - Engineer, mining and Hygiene: Sit to stand from 3-in-1 or toilet Tub/Shower Transfer Method: Not assessed Equipment Used: Rolling walker ADL Comments: Pt fatigues rapidly and note audible wheezing during transfers. Sats 97% on RA. Pt fatigued after transfer to [facility] (2-Rh) only and then to recliner. Discussed SNF as option versus HHOT and aide. Pt interested if he does go home to have an aide for bathing and dressing as he doesnt want granddaughter to help with these tasks. Had nursing tech nearby for transfer off BSC to chair due to fatigue but still +1 assist.    OT Diagnosis: Generalized weakness;Acute pain  OT Problem List: Decreased strength;Decreased knowledge of use of DME or AE;Pain;Decreased activity tolerance;Decreased range of motion OT Treatment Interventions: Self-care/ADL training;Therapeutic activities;DME and/or AE instruction;Patient/family education   OT Goals Acute Rehab OT Goals OT Goal Formulation: With patient/family Time For Goal Achievement: 03/26/12 Potential to Achieve Goals: Good ADL Goals Pt Will Perform Grooming: with min assist;Standing at sink ADL Goal: Grooming - Progress: Goal set today Pt Will Perform Lower Body Bathing: with min assist;Sit to stand from chair;Sit to stand from bed;with adaptive equipment ADL Goal: Lower Body Bathing - Progress: Goal set today Pt Will Perform Lower Body Dressing: with min assist;with adaptive equipment;Sit to stand from chair;Sit to stand from bed ADL Goal: Lower Body Dressing - Progress: Goal set today Pt Will Transfer to Toilet: with min assist;with DME;Ambulation;3-in-1 ADL Goal: Toilet Transfer - Progress: Goal set today Pt Will Perform Toileting - Clothing Manipulation: with min assist;Standing ADL Goal: Toileting - Clothing Manipulation - Progress: Goal set today Pt Will Perform Tub/Shower Transfer: with min assist;with DME;Other  (comment) (if ready for shower transfer/if pt goes home) ADL Goal: Tub/Shower Transfer - Progress: Goal set today  Visit Information  Last OT Received On: 03/19/12 Assistance  Needed: +1    Subjective Data  Subjective: I was up urinating all night Patient Stated Goal: wants to go home   Prior Functioning     Home Living Lives With: Alone Available Help at Discharge: Family;Other (Comment) (granddaughter) Type of Home: House Home Access: Ramped entrance Home Layout: Two level;Able to live on main level with bedroom/bathroom Bathroom Shower/Tub: Walk-in shower Bathroom Toilet: Handicapped height Home Adaptive Equipment: Walker - rolling;Grab bars in shower;Grab bars around toilet;Bedside commode/3-in-1;Built-in shower seat Prior Function Level of Independence: Independent Able to Take Stairs?: Yes Communication Communication: No difficulties         Vision/Perception     Cognition  Overall Cognitive Status: Appears within functional limits for tasks assessed/performed Arousal/Alertness: Awake/alert Orientation Level: Appears intact for tasks assessed Behavior During Session: [facility] for tasks performed    Extremity/Trunk Assessment Right Upper Extremity Assessment RUE ROM/Strength/Tone: [facility] for tasks assessed Left Upper Extremity Assessment LUE ROM/Strength/Tone: [facility] for tasks assessed     Mobility Bed Mobility Bed Mobility: Supine to Sit Supine to Sit: 4: Min assist;With rails Details for Bed Mobility Assistance: min assist for L LE and technique. effortful Transfers Transfers: Sit to Stand;Stand to Sit Sit to Stand: 3: Mod assist;With upper extremity assist;From bed;From chair/3-in-1 Stand to Sit: 3: Mod assist;With upper extremity assist;To chair/3-in-1 Details for Transfer Assistance: cues for hand placement and L LE management. Assist to rise and stabilize and then control descent to chair.      Shoulder Instructions     Exercise     Balance     End of  Session OT - End of Session Equipment Utilized During Treatment: Gait belt Activity Tolerance: Patient limited by fatigue;Patient limited by pain Patient left: in chair;with call bell/phone within reach;with family/visitor present  GO     Lennox Laity [phone] 03/19/2012, 9:34 AM

## 2012-03-19 NOTE — Progress Notes (Signed)
Patient ID: Jack [name], male   DOB: 12-Dec-1928, 78 y.o.   MRN: [id number]     Subjective:  Patient reports pain as mild to moderate.  He has some mild agitation today.  Sitting up eating breakfast.  Objective:   VITALS:   Filed Vitals:   03/19/12 0910 03/19/12 1051 03/19/12 1405 03/19/12 1659  BP:   101/59   Pulse:  148 89   Temp:   98.3 F (36.8 C)   TempSrc:   Oral   Resp:   18 18  Height:      Weight:      SpO2: 97%  95% 95%    Sensation intact distally Dorsiflexion/Plantar flexion intact Incision: dressing C/D/I and no drainage   LABS  Results for orders placed during the hospital encounter of 03/17/12 (from the past 24 hour(s))  CBC     Status: Abnormal   Collection Time   03/19/12  3:50 AM      Component Value Range   WBC 8.1  4.0 - 10.5 K/uL   RBC 3.72 (*) 4.22 - 5.81 MIL/uL   Hemoglobin 11.4 (*) 13.0 - 17.0 g/dL   HCT 40.9 (*) 81.1 - 91.4 %   MCV 90.6  78.0 - 100.0 fL   MCH 30.6  26.0 - 34.0 pg   MCHC 33.8  30.0 - 36.0 g/dL   RDW 78.2  95.6 - 21.3 %   Platelets 127 (*) 150 - 400 K/uL    No results found.  Assessment/Plan: 2 Days Post-Op   Principal Problem:  *Osteoarthritis of left knee Active Problems:  Dizzy   Advance diet Up with therapy Plan for DC snf [facility]. Plan dressing change tomorrow.   Mlissa Tamayo P 03/19/2012, 7:32 PM   Teryl Lucy, MD Cell [phone] Pager [phone]

## 2012-03-19 NOTE — Progress Notes (Signed)
03/19/2012 Raynelle Bring BSN CCM [phone] Daughter wanted list of private duty personal care services(self Pay). List given and daughter will discuss with patient. Questions answered. CSW has spoken with daughter. CM will follow

## 2012-03-19 NOTE — Progress Notes (Signed)
Physical Therapy Treatment Patient Details Name: Jack [name] MRN: [id number] DOB: [birth year] Today's Date: 03/19/2012 Time: 1036-1100 PT Time Calculation (min): 24 min  PT Assessment / Plan / Recommendation Comments on Treatment Session  Pt having a rough morning.  C/o poor sleep last night "I had to pee every 20 min". Pt demon limited activity tolerance and was unable to progress past 6'.  Very shaky, unsteady gait.    Follow Up Recommendations  Supervision/Assistance - 24 hour;Skilled nursing facility    Barriers to Discharge        Equipment Recommendations  None recommended by PT;None recommended by OT    Recommendations for Other Services    Frequency 7X/week   Plan Discharge plan remains appropriate    Precautions / Restrictions Precautions Precautions: Knee Required Braces or Orthoses: Knee Immobilizer - Left Knee Immobilizer - Left: Discontinue once straight leg raise with < 10 degree lag Restrictions Weight Bearing Restrictions: No LLE Weight Bearing: Weight bearing as tolerated    Pertinent Vitals/Pain C/o 9/10 knee pain ICE applied    Mobility  Bed Mobility Bed Mobility: Sit to Supine Supine to Sit: 4: Min assist;With rails Sit to Supine: 3: Mod assist Details for Bed Mobility Assistance: Assisted back to bed 2nd max c/o fatigue  Transfers Transfers: Sit to Stand;Stand to Sit Sit to Stand: 1: +2 Total assist;From chair/3-in-1 Sit to Stand: Patient Percentage: 70% Stand to Sit: 1: +2 Total assist;To bed Stand to Sit: Patient Percentage: 70% Details for Transfer Assistance: 25% VC's on proper tech and hand placement.  Pt demon increased unsteadyness/shaky throughout.    Ambulation/Gait Ambulation/Gait Assistance: 1: +2 Total assist Ambulation/Gait: Patient Percentage: 70% Ambulation Distance (Feet): 6 Feet Assistive device: Rolling walker Ambulation/Gait Assistance Details: 75% VC's on proper sequencing and to relax and take slow deep breaths as pt  demon with increased anxiety.  HR increased to 148 with RA sats @ 95%.  Noted increased difficulty with amb so asssited back to bed. Gait Pattern: Step-to pattern Gait velocity: decreased     PT Goals      progressing    Visit Information  Last PT Received On: 03/19/12 Assistance Needed: +2             End of Session PT - End of Session Equipment Utilized During Treatment: Gait belt;Left knee immobilizer Activity Tolerance: Patient limited by fatigue;Patient limited by pain Patient left: in bed;with call bell/phone within reach;with family/visitor present Nurse Communication: Mobility status;Other (comment) (HR 148 during gait)  Felecia Shelling  PTA [facility]  Acute  Rehab Pager     [phone]

## 2012-03-19 NOTE — Progress Notes (Signed)
Physical Therapy Treatment Patient Details Name: Jack [name] MRN: [id number] DOB: [birth year] Today's Date: 03/19/2012 Time: 7829-5621 PT Time Calculation (min): 27 min  PT Assessment / Plan / Recommendation Comments on Treatment Session  POD #2 pm session. Pt progressing slowly and demon unsteady, shaky gait. Assisted OOB to amb in hallway then back to bed for TE's.     Follow Up Recommendations  Supervision/Assistance - 24 hour;Skilled nursing facility    Barriers to Discharge        Equipment Recommendations  None recommended by PT;None recommended by OT    Recommendations for Other Services    Frequency 7X/week   Plan Discharge plan remains appropriate    Precautions / Restrictions Precautions Precautions: Knee Precaution Comments: Instructed pt on KI use for amb Required Braces or Orthoses: Knee Immobilizer - Left Knee Immobilizer - Left: Discontinue once straight leg raise with < 10 degree lag Restrictions Weight Bearing Restrictions: No LLE Weight Bearing: Weight bearing as tolerated    Pertinent Vitals/Pain C/o 4/10 knee pain during TE's ICE applied    Mobility  Bed Mobility Bed Mobility: Sit to Supine Supine to Sit: 3: Mod assist Sit to Supine: 3: Mod assist Details for Bed Mobility Assistance: Mod assist to support L LE up onto bed  Transfers Transfers: Sit to Stand;Stand to Sit Sit to Stand: 1: +2 Total assist;From bed Sit to Stand: Patient Percentage: 70% Stand to Sit: 1: +2 Total assist;To bed Stand to Sit: Patient Percentage: 70% Details for Transfer Assistance: 25% VC's on proper tech and hand placement. Pt demon increased unsteadyness/shaky throughout  Ambulation/Gait Ambulation/Gait Assistance: 1: +2 Total assist Ambulation/Gait: Patient Percentage: 70% Ambulation Distance (Feet): 26 Feet Assistive device: Rolling walker Ambulation/Gait Assistance Details: Pt able to tolerate increased amb distance stating he felt better.  Still required + 2  asssit for safety.  Unsteady gait. HR avg 110 - 88 during gait. Pt more able to relax. Gait Pattern: Step-to pattern;Decreased stance time - left Gait velocity: decreased    Exercises Total Joint Exercises Ankle Circles/Pumps: AROM;Both;10 reps;Supine Quad Sets: AROM;Both;10 reps;Supine Gluteal Sets: AROM;Both;10 reps;Supine Towel Squeeze: AROM;Both;10 reps;Supine Short Arc Quad: AAROM;Left;10 reps;Supine Heel Slides: AAROM;Left;10 reps;Supine Hip ABduction/ADduction: AAROM;Left;10 reps;Supine Straight Leg Raises: AAROM;Left;10 reps;Supine    PT Goals progressing slowly    Visit Information  Last PT Received On: 03/19/12 Assistance Needed: +2                   End of Session PT - End of Session Equipment Utilized During Treatment: Gait belt Activity Tolerance: Patient tolerated treatment well Patient left: in bed;with call bell/phone within reach;with family/visitor present Nurse Communication: Mobility status;Other (comment) (HR 148 during gait)  Felecia Shelling  PTA [facility]  Acute  Rehab Pager     [phone]

## 2012-03-19 NOTE — Telephone Encounter (Signed)
lmonvm for pt re appt for 10/30 and mailed schedule.

## 2012-03-20 DIAGNOSIS — M1120 Other chondrocalcinosis, unspecified site: Secondary | ICD-10-CM

## 2012-03-20 LAB — CBC
HCT: 32.5 % — ABNORMAL LOW (ref 39.0–52.0)
Hemoglobin: 10.8 g/dL — ABNORMAL LOW (ref 13.0–17.0)
MCH: 30.4 pg (ref 26.0–34.0)
MCHC: 33.2 g/dL (ref 30.0–36.0)
MCV: 91.5 fL (ref 78.0–100.0)
RDW: 13.4 % (ref 11.5–15.5)

## 2012-03-20 MED ORDER — COLCHICINE 0.6 MG PO TABS
0.6000 mg | ORAL_TABLET | Freq: Once | ORAL | Status: AC
  Administered 2012-03-20: 0.6 mg via ORAL
  Filled 2012-03-20: qty 1

## 2012-03-20 MED ORDER — COLCHICINE 0.6 MG PO TABS: 0.6000 mg | ORAL_TABLET | Freq: Every day | ORAL | Status: DC

## 2012-03-20 MED ORDER — COLCHICINE 0.6 MG PO TABS
1.2000 mg | ORAL_TABLET | ORAL | Status: AC
  Administered 2012-03-20: 1.2 mg via ORAL
  Filled 2012-03-20: qty 2

## 2012-03-20 NOTE — Progress Notes (Signed)
Subjective: Jack [name] is going to [facility] for rehab today. This AM he had the onset of a very painful, red and hot 1st MTP joint right foot c/w gout flare.  Objective: Lab: Lab Results  Component Value Date   WBC 7.9 03/20/2012   HGB 10.8* 03/20/2012   HCT 32.5* 03/20/2012   MCV 91.5 03/20/2012   PLT 126* 03/20/2012   BMET    Component Value Date/Time   NA 131* 03/18/2012 0400   K 4.2 03/18/2012 0400   CL 96 03/18/2012 0400   CO2 27 03/18/2012 0400   GLUCOSE 128* 03/18/2012 0400   BUN 11 03/18/2012 0400   CREATININE 0.82 03/18/2012 0400   CALCIUM 8.5 03/18/2012 0400   GFRNONAA 81* 03/18/2012 0400   GFRAA >90 03/18/2012 0400     Imaging:  Scheduled Meds:   . alfuzosin  10 mg Oral Daily  . Besifloxacin HCl  1 drop Ophthalmic TID  . celecoxib  100 mg Oral BID  . colchicine  1.2 mg Oral NOW  . Difluprednate  1 drop Ophthalmic TID  . docusate sodium  100 mg Oral BID  . dorzolamide  1 drop Both Eyes TID  . enoxaparin (LOVENOX) injection  30 mg Subcutaneous Q12H  . Fluticasone-Salmeterol  1 puff Inhalation BID  . loratadine  10 mg Oral Daily  . magnesium oxide  400 mg Oral Daily  . metoprolol succinate  75 mg Oral q morning - 10a  . nepafenac  1 drop Both Eyes TID  . pantoprazole  80 mg Oral Q1200  . potassium chloride SA  20 mEq Oral q morning - 10a  . quiNINE  324 mg Oral BID  . senna  1 tablet Oral BID  . simvastatin  20 mg Oral q1800  . temazepam  30 mg Oral QHS  . triamcinolone cream  1 application Topical BID   Continuous Infusions:   . 0.45 % NaCl with KCl 20 mEq / L Stopped (03/18/12 0700)   PRN Meds:.acetaminophen, acetaminophen, albuterol, alum & mag hydroxide-simeth, diphenhydrAMINE, diphenoxylate-atropine, HYDROmorphone (DILAUDID) injection, hyoscyamine, menthol-cetylpyridinium, methocarbamol (ROBAXIN) IV, methocarbamol, metoCLOPramide (REGLAN) injection, metoCLOPramide, ondansetron (ZOFRAN) IV, ondansetron, oxyCODONE, phenol, polyethylene glycol,  sorbitol   Physical Exam: Filed Vitals:   03/20/12 0955  BP: 135/71  Pulse: 87  Temp:   Resp:   Older man in a chair in no distress Pulm - normal respirations Cor - RRR MSK - right 1st MTP joint red, hot and very tender      Assessment/Plan: Gout flare - Plan - colchicine 1.2 mg now, may have 0.6 mg in two hours if not relieved.  Uric acid level to determine if he is a candidate for allopurinol.   Jack [name] IM (o) [phone]; (c) [phone] Call-grp - Jack [name] IM Tele: [phone]  03/20/2012, 10:48 AM

## 2012-03-20 NOTE — Progress Notes (Signed)
Physical Therapy Treatment Patient Details Name: JEANLUC WEGMAN MRN: [id number] DOB: 07-14-1928 Today's Date: 03/20/2012 Time: 4098-1191 PT Time Calculation (min): 13 min  PT Assessment / Plan / Recommendation Comments on Treatment Session  Pt states his R foot is much better.  Assisted pt from recliner to BR to void then back to recliner.  Still very unsteady shaky gait.  Pt states, "I am glad I am not going home".  Pt plans to D/C to SNF for ST Rehab today.    Follow Up Recommendations  Skilled nursing facility    Barriers to Discharge        Equipment Recommendations       Recommendations for Other Services    Frequency 7X/week   Plan Discharge plan remains appropriate    Precautions / Restrictions Precautions Precautions: Knee Precaution Comments: Instructed pt on KI use for amb  Required Braces or Orthoses: Knee Immobilizer - Left Knee Immobilizer - Left: Discontinue once straight leg raise with < 10 degree lag Restrictions Weight Bearing Restrictions: No LLE Weight Bearing: Weight bearing as tolerated    Pertinent Vitals/Pain C/o 7/10 L knee pain Pre medicated    Mobility  Bed Mobility Bed Mobility: Not assessed Supine to Sit: 4: Min assist Details for Bed Mobility Assistance: Pt OOB in recliner  Transfers Transfers: Sit to Stand;Stand to Sit Sit to Stand: 2: Max assist;From chair/3-in-1 Stand to Sit: 2: Max assist;To chair/3-in-1 Details for Transfer Assistance: 50% VC's on proper tech and hand placement  Ambulation/Gait Ambulation/Gait Assistance: 2: Max Environmental consultant (Feet): 10 Feet Assistive device: Rolling walker Ambulation/Gait Assistance Details: Amb to and from BR.  Pt states his R foot feels better and was able to fully WB. Gait Pattern: Step-to pattern Gait velocity: decreased        PT Goals              progressing    Visit Information  Last PT Received On: 03/20/12 Assistance Needed: +1                   End of  Session PT - End of Session Equipment Utilized During Treatment: Gait belt Activity Tolerance: Patient tolerated treatment well Patient left: in chair;with call bell/phone within reach Nurse Communication: Other (comment);Mobility status (inability to amb 2nd new onset R foot pain)  Felecia Shelling  PTA [facility]  Acute  Rehab Pager     [phone]

## 2012-03-20 NOTE — Progress Notes (Signed)
Physical Therapy Treatment Patient Details Name: Jack [name] MRN: [id number] DOB: 1928-08-18 Today's Date: 03/20/2012 Time: 4098-1191 PT Time Calculation (min): 42 min  PT Assessment / Plan / Recommendation Comments on Treatment Session  Assisted pt OOB to amb when pt was unable to fully WB thru R LE 2nd 1st MT joint with noted edema and reddness.  Reported to nurse.  Pt suppose to D/C to [facility] today for Rehab.    Follow Up Recommendations  Skilled nursing facility    Barriers to Discharge        Equipment Recommendations       Recommendations for Other Services    Frequency 7X/week   Plan Discharge plan remains appropriate    Precautions / Restrictions Precautions Precautions: Knee Precaution Comments: Instructed pt on KI use for amb  Required Braces or Orthoses: Knee Immobilizer - Left Knee Immobilizer - Left: Discontinue once straight leg raise with < 10 degree lag Restrictions Weight Bearing Restrictions: No LLE Weight Bearing: Weight bearing as tolerated    Pertinent Vitals/Pain C/o 10/10 R foot pain    Mobility  Bed Mobility Bed Mobility: Supine to Sit Supine to Sit: 4: Min assist Details for Bed Mobility Assistance: increased time and one VC on proper tech  Transfers Transfers: Sit to Stand;Stand to Sit Sit to Stand: 2: Max assist;From bed Stand to Sit: 2: Max assist;To chair/3-in-1 Details for Transfer Assistance: increased c/o pain on R foot 1st MT joint with noted edema and reddness. Inability to fully WB and c/o 10/10 pain.   Exercises Total Joint Exercises Ankle Circles/Pumps: AROM;Both;10 reps;Supine Quad Sets: AROM;Both;10 reps;Supine Gluteal Sets: AROM;Both;10 reps;Supine Towel Squeeze: AROM;Both;10 reps;Supine Short Arc Quad: AAROM;10 reps;Supine;Left Heel Slides: AAROM;Left;10 reps;Supine Hip ABduction/ADduction: AAROM;Left;10 reps;Supine Straight Leg Raises: AAROM;Left;10 reps;Supine    PT Goals progressing                   progressing    Visit Information  Last PT Received On: 03/20/12 Assistance Needed: +1                   End of Session PT - End of Session Equipment Utilized During Treatment: Gait belt Activity Tolerance: Patient limited by pain Patient left: in chair Nurse Communication: Other (comment);Mobility status (inability to amb 2nd new onset R foot pain)   Felecia Shelling  PTA [facility]  Acute  Rehab Pager     [phone]

## 2012-03-20 NOTE — Progress Notes (Signed)
Patient c/o pain and reddness near 1st MTP. Patient had difficulty walking die to pain. Dr. Dion Saucier notified. No new orders given

## 2012-03-20 NOTE — Progress Notes (Signed)
03/20/2012 Feather Berrie,RN BSN CCM [phone] CM ADVISED BY CSW THAT CURRENT PLANS ARE FOR SNF REHAB; CM SIGNING OFF.

## 2012-03-20 NOTE — Discharge Summary (Signed)
Physician Discharge Summary  Patient ID: Jack [name] MRN: [id number] DOB/AGE: 76-Dec-1930 76 y.o.  Admit date: 03/17/2012 Discharge date: 03/20/2012  Admission Diagnoses:  Osteoarthritis of left knee  Discharge Diagnoses:  Principal Problem:  *Osteoarthritis of left knee Active Problems:  Dizzy   Past Medical History  Diagnosis Date  . GERD (gastroesophageal reflux disease)   . Osteoarthritis   . Alcohol abuse     in recovery 2006  . 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 wine only  . Irritable bowel   . Hyperlipidemia   . Insomnia   . PAC (premature atrial contraction)     stable (ruled out for a fib)  . Prostatitis     chronic bacterial  . Pseudogout   . Sinus problem     chronic  . Cellulitis     2nd to T. Pedis(not at present)  . Hearing loss 03-10-12    bilateral hearing aids  . Complication of anesthesia 03-10-12    after shoulder  surgery -difficulty awakening,breathing problems  . Emphysema 03-10-12    tx. inhalers, uses steam room frequently  . Cramping of feet 03-10-12    cramping of both legs and hands occ.  . Non Hodgkin's lymphoma 03-10-12     '07-1 yr. in remission(Shadad)-not seeing now  . Osteoarthritis of left knee 03/17/2012    Surgeries: Procedure(s): TOTAL KNEE ARTHROPLASTY on 03/17/2012   Consultants (if any):    Discharged Condition: Improved  Hospital Course: Jack [name] is an 78 y.o. male who was admitted 03/17/2012 with a diagnosis of Osteoarthritis of left knee and went to the operating room on 03/17/2012 and underwent the above named procedures.    He was given perioperative antibiotics:  Anti-infectives     Start     Dose/Rate Route Frequency Ordered Stop   03/17/12 2245   quiNINE (QUALAQUIN) capsule 324 mg        324 mg Oral 2 times daily 03/17/12 2242     03/17/12 1900   vancomycin (VANCOCIN) IVPB 1000 mg/200 mL premix        1,000 mg 200 mL/hr over 60 Minutes Intravenous Every 12 hours  03/17/12 1250 03/17/12 1931   03/17/12 1300   quiNINE (QUALAQUIN) capsule 324 mg  Status:  Discontinued        324 mg Oral 2 times daily 03/17/12 1250 03/17/12 1338   03/17/12 0608   vancomycin (VANCOCIN) IVPB 1000 mg/200 mL premix        1,000 mg 200 mL/hr over 60 Minutes Intravenous 60 min pre-op 03/17/12 0608 03/17/12 0700        .  He was given sequential compression devices, early ambulation, and lovenox for DVT prophylaxis.  He benefited maximally from the hospital stay and there were no complications.    Recent vital signs:  Filed Vitals:   03/20/12 0516  BP: 137/59  Pulse: 83  Temp: 98 F (36.7 C)  Resp: 16    Recent laboratory studies:  Lab Results  Component Value Date   HGB 10.8* 03/20/2012   HGB 11.4* 03/19/2012   HGB 11.7* 03/18/2012   Lab Results  Component Value Date   WBC 7.9 03/20/2012   PLT 126* 03/20/2012   Lab Results  Component Value Date   INR 0.99 03/17/2012   Lab Results  Component Value Date   NA 131* 03/18/2012   K 4.2 03/18/2012   CL 96 03/18/2012   CO2 27 03/18/2012  BUN 11 03/18/2012   CREATININE 0.82 03/18/2012   GLUCOSE 128* 03/18/2012    Discharge Medications:     Medication List     As of 03/20/2012  8:32 AM    STOP taking these medications         HYDROcodone-acetaminophen 5-325 MG per tablet   Commonly known as: NORCO/VICODIN      TAKE these medications         albuterol 108 (90 BASE) MCG/ACT inhaler   Commonly known as: PROVENTIL HFA;VENTOLIN HFA   Inhale 2 puffs into the lungs every 6 (six) hours as needed. For shortness of breath      alfuzosin 10 MG 24 hr tablet   Commonly known as: UROXATRAL   Take 10 mg by mouth every morning.      aspirin 81 MG tablet   Take 81 mg by mouth daily.      BESIVANCE 0.6 % Susp   Generic drug: Besifloxacin HCl   Apply 1 drop to eye 3 (three) times daily. PT BROUGHT OWN EYE DROP FROM HOME      bisacodyl 5 MG EC tablet   Commonly known as: DULCOLAX   Take 1 tablet (5 mg total)  by mouth daily as needed for constipation.      celecoxib 100 MG capsule   Commonly known as: CELEBREX   Take 1 capsule (100 mg total) by mouth 2 (two) times daily.      diphenoxylate-atropine 2.5-0.025 MG per tablet   Commonly known as: LOMOTIL   Take 1 tablet by mouth 4 (four) times daily as needed. For loose stool      dorzolamide 2 % ophthalmic solution   Commonly known as: TRUSOPT   Place 1 drop into both eyes 3 (three) times daily. NEW PRESCRIPTION, PT WILL NEED FILLED FOR ADMISSION      DUREZOL 0.05 % Emul   Generic drug: Difluprednate   Apply 1 drop to eye 3 (three) times daily. Pt brought his own eye drops from home      enoxaparin 30 MG/0.3ML injection   Commonly known as: LOVENOX   Inject 0.3 mLs (30 mg total) into the skin every 12 (twelve) hours.      esomeprazole 40 MG capsule   Commonly known as: NEXIUM   Take 40 mg by mouth daily before breakfast.      fexofenadine 180 MG tablet   Commonly known as: ALLEGRA   Take 180 mg by mouth daily as needed. For allergies      Fluticasone-Salmeterol 250-50 MCG/DOSE Aepb   Commonly known as: ADVAIR   Inhale 1 puff into the lungs every 12 (twelve) hours.      Ginkgo Biloba 40 MG Tabs   Take by mouth 2 (two) times daily as needed. Leg  cramps      glucosamine-chondroitin 500-400 MG tablet   Take 1 tablet by mouth daily.      hyoscyamine 0.125 MG Tbdp   Commonly known as: ANASPAZ   dissolve 1 tablet under the tongue every 2 hours if needed for bloating gas and CRAMPS      magnesium oxide 400 (241.3 MG) MG tablet   Commonly known as: MAG-OX   take 1 tablet by mouth once daily      metoprolol succinate 50 MG 24 hr tablet   Commonly known as: TOPROL-XL   Take 75 mg by mouth every morning. Take with or immediately following a meal.      nepafenac 0.1 % ophthalmic suspension  Commonly known as: NEVANAC   Place 3 drops into both eyes 3 (three) times daily. PT BROUGHT HIS OWN FROM HOMW      oxyCODONE-acetaminophen  5-325 MG per tablet   Commonly known as: PERCOCET/ROXICET   Take 1-2 tablets by mouth every 6 (six) hours as needed for pain.      potassium chloride SA 20 MEQ tablet   Commonly known as: K-DUR,KLOR-CON   Take 20 mEq by mouth every morning.      quiNINE 324 MG capsule   Commonly known as: QUALAQUIN   Take 324 mg by mouth 2 (two) times daily.      sennosides-docusate sodium 8.6-50 MG tablet   Commonly known as: SENOKOT-S   Take 1 tablet by mouth daily.      simvastatin 20 MG tablet   Commonly known as: ZOCOR   take 1 tablet by mouth at bedtime      temazepam 30 MG capsule   Commonly known as: RESTORIL   Take 30 mg by mouth at bedtime.      triamcinolone cream 0.1 %   Commonly known as: KENALOG   Apply 1 application topically 2 (two) times daily. To rash on skin. Avoid face.      VIAGRA 100 MG tablet   Generic drug: sildenafil   take 1 tablet by mouth if needed for ERECTILE DYSFUNCTION 1/2 OR 1 TABLET AS NEEDED        Diagnostic Studies: Dg Chest 2 View  03/10/2012  *RADIOLOGY REPORT*  Clinical Data: Preoperative assessment for left total knee replacement, former smoker, minimal shortness of breath with exertion, hypertension  CHEST - 2 VIEW  Comparison: 10/18/2004  Findings: Upper-normal size of cardiac silhouette. Mediastinal contours and pulmonary vascularity normal. Calcified granuloma left upper lobe. Focal eventration right diaphragm again noted. No acute infiltrate, pleural effusion or pneumothorax. No acute osseous findings.  IMPRESSION: No acute abnormalities.   Original Report Authenticated By: Lollie Marrow, M.D.    X-ray Knee Left Port  03/17/2012  *RADIOLOGY REPORT*  Clinical Data: Postop left total knee arthroplasty.  PORTABLE LEFT KNEE - 1-2 VIEW  Comparison: Preoperative radiographs 10/15/2011.  Findings: The patient has undergone interval left total knee arthroplasty.  The hardware appears well positioned.  There is no evidence of acute fracture or dislocation.   There is air within the joint and soft tissues surrounding the knee.  IMPRESSION: Interval left total knee arthroplasty without demonstrated complication.   Original Report Authenticated By: Gerrianne Scale, M.D.     Disposition: SNF      Discharge Orders    Future Appointments: Provider: Department: Dept Phone: Center:   04/22/2012 1:00 PM Radene Gunning [facility] [phone] None   04/22/2012 1:30 PM Benjiman Core, MD [facility] [phone] None     Future Orders Please Complete By Expires   Diet general      Weight bearing as tolerated      Call MD / Call 911      Comments:   If you experience chest pain or shortness of breath, CALL 911 and be transported to the hospital emergency room.  If you develope a fever above 101 F, pus (white drainage) or increased drainage or redness at the wound, or calf pain, call your surgeon's office.   Discharge instructions      Comments:   Change dressing in 3 days and reapply fresh dressing, unless you have a splint (half cast).  If you have a splint/cast, just leave  in place until your follow-up appointment.    Keep wounds dry for 3 weeks.  Leave steri-strips in place on skin.  Do not apply lotion or anything to the wound.   Constipation Prevention      Comments:   Drink plenty of fluids.  Prune juice may be helpful.  You may use a stool softener, such as Colace (over the counter) 100 mg twice a day.  Use MiraLax (over the counter) for constipation as needed.   TED hose      Comments:   Use stockings (TED hose) for 2 weeks on both leg(s).  You may remove them at night for sleeping.   Change dressing      Comments:   Change dressing in three days, then change the dressing daily with sterile 4 x 4 inch gauze dressing.  You may clean the incision with alcohol prior to redressing.   Do not put a pillow under the knee. Place it under the heel.         Follow-up Information    Follow up with Eulas Post, MD. Schedule an  appointment as soon as possible for a visit in 2 weeks.   Contact information:   [facility] [address] [place] [address] [phone]           Signed: Eulas Post 03/20/2012, 8:32 AM

## 2012-03-20 NOTE — Progress Notes (Signed)
Clinical Social Work Department CLINICAL SOCIAL WORK PLACEMENT NOTE 03/20/2012  Patient:  Jack [name], Jack [name]  Account Number:  [id number] Admit date:  03/17/2012  Clinical Social Worker:  Cori Razor, LCSW  Date/time:  03/20/2012 08:00 AM  Clinical Social Work is seeking post-discharge placement for this patient at the following level of care:   SKILLED NURSING   (*CSW will update this form in Epic as items are completed)   03/19/2012  Patient/family provided with [facility] Department of Clinical Social Work's list of facilities offering this level of care within the geographic area requested by the patient (or if unable, by the patient's family).  03/19/2012  Patient/family informed of their freedom to choose among providers that offer the needed level of care, that participate in Medicare, Medicaid or managed care program needed by the patient, have an available bed and are willing to accept the patient.    Patient/family informed of [facility]' ownership interest in [facility], as well as of the fact that they are under no obligation to receive care at this facility.  PASARR submitted to EDS on 03/17/2012 PASARR number received from EDS on 03/17/2012  FL2 transmitted to all facilities in geographic area requested by pt/family on  03/20/2012 FL2 transmitted to all facilities within larger geographic area on   Patient informed that his/her managed care company has contracts with or will negotiate with  certain facilities, including the following:     Patient/family informed of bed offers received:   Patient chooses bed at  Physician recommends and patient chooses bed at    Patient to be transferred to  on   Patient to be transferred to facility by   The following physician request were entered in Epic:   Additional Comments:  Cori Razor LCSW [phone]

## 2012-03-20 NOTE — Progress Notes (Signed)
Clinical Social Work Department BRIEF PSYCHOSOCIAL ASSESSMENT 03/20/2012  Patient:  Jack [name], Jack [name]     Account Number:  [id number]     Admit date:  03/17/2012  Clinical Social Worker:  Candie Chroman  Date/Time:  03/20/2012 07:25 AM  Referred by:  Physician  Date Referred:  03/19/2012 Referred for  SNF Placement   Other Referral:   Interview type:  Patient Other interview type:    PSYCHOSOCIAL DATA Living Status:  FAMILY Admitted from facility:   Level of care:   Primary support name:  Anthonette Legato Primary support relationship to patient:  CHILD, ADULT Degree of support available:   supportive    CURRENT CONCERNS Current Concerns  Post-Acute Placement   Other Concerns:    SOCIAL WORK ASSESSMENT / PLAN Pt is a51 yr old gentleman living at home prior to hospitalization. CSW met with pt /family 9/26 to assist with d/c planning. Pt prefers to return home but willing to consider ST Rehab.SNF search initiated this am and bed offers will be provided as received.   Assessment/plan status:  Psychosocial Support/Ongoing Assessment of Needs Other assessment/ plan:   HH Services / pvt pay assistance / family support   Information/referral to community resources:   SNF list provided    PATIENT'S/FAMILY'S RESPONSE TO PLAN OF CARE: Pt is disappointed with his slow progress. He wants to go home but will consider ST placement.    Cori Razor LCSW [phone]

## 2012-03-20 NOTE — Progress Notes (Signed)
Patient ID: Jack [name], male   DOB: [birth year], 78 y.o.   MRN: [id number]     Subjective:  Patient reports pain as mild to moderate.  States that he had a better night and was able to sleep. Patient requests SNF Placement.  Objective:   VITALS:   Filed Vitals:   03/19/12 1659 03/19/12 2009 03/19/12 2231 03/20/12 0516  BP:   137/76 137/59  Pulse:   86 83  Temp:   100.6 F (38.1 C) 98 F (36.7 C)  TempSrc:   Oral Oral  Resp: 18 18 16 16   Height:      Weight:      SpO2: 95% 93% 91% 96%    ABD soft Sensation intact distally Dorsiflexion/Plantar flexion intact Incision: no drainage Wound clean and Dry no sign of infection. Dry dressing applied   LABS  Results for orders placed during the hospital encounter of 03/17/12 (from the past 24 hour(s))  CBC     Status: Abnormal   Collection Time   03/20/12  3:50 AM      Component Value Range   WBC 7.9  4.0 - 10.5 K/uL   RBC 3.55 (*) 4.22 - 5.81 MIL/uL   Hemoglobin 10.8 (*) 13.0 - 17.0 g/dL   HCT 40.9 (*) 81.1 - 91.4 %   MCV 91.5  78.0 - 100.0 fL   MCH 30.4  26.0 - 34.0 pg   MCHC 33.2  30.0 - 36.0 g/dL   RDW 78.2  95.6 - 21.3 %   Platelets 126 (*) 150 - 400 K/uL    No results found.  Assessment/Plan: 3 Days Post-Op   Principal Problem:  *Osteoarthritis of left knee Active Problems:  Dizzy   Advance diet Up with therapy Discharge to SNF when arranged by SW   Doris Mcgilvery P 03/20/2012, 8:31 AM   Teryl Lucy, MD Cell [phone] Pager [phone]

## 2012-03-20 NOTE — Progress Notes (Signed)
Clinical Social Work Department CLINICAL SOCIAL WORK PLACEMENT NOTE 03/20/2012  Patient:  Jack [name], Jack [name]  Account Number:  [id number] Admit date:  03/17/2012  Clinical Social Worker:  Cori Razor, LCSW  Date/time:  03/20/2012 08:00 AM  Clinical Social Work is seeking post-discharge placement for this patient at the following level of care:   SKILLED NURSING   (*CSW will update this form in Epic as items are completed)   03/19/2012  Patient/family provided with [facility] Department of Clinical Social Work's list of facilities offering this level of care within the geographic area requested by the patient (or if unable, by the patient's family).  03/19/2012  Patient/family informed of their freedom to choose among providers that offer the needed level of care, that participate in Medicare, Medicaid or managed care program needed by the patient, have an available bed and are willing to accept the patient.    Patient/family informed of [facility]' ownership interest in [facility], as well as of the fact that they are under no obligation to receive care at this facility.  PASARR submitted to EDS on 03/17/2012 PASARR number received from EDS on 03/17/2012  FL2 transmitted to all facilities in geographic area requested by pt/family on  03/20/2012 FL2 transmitted to all facilities within larger geographic area on   Patient informed that his/her managed care company has contracts with or will negotiate with  certain facilities, including the following:     Patient/family informed of bed offers received:  03/20/2012 Patient chooses bed at [facility] Physician recommends and patient chooses bed at    Patient to be transferred to [facility] on  03/20/2012 Patient to be transferred to facility by [facility]  The following physician request were entered in Epic:   Additional Comments:  Cori Razor LCSW [phone]

## 2012-04-21 ENCOUNTER — Other Ambulatory Visit: Payer: Self-pay | Admitting: Oncology

## 2012-04-21 DIAGNOSIS — C8589 Other specified types of non-Hodgkin lymphoma, extranodal and solid organ sites: Secondary | ICD-10-CM

## 2012-04-22 ENCOUNTER — Telehealth: Payer: Self-pay | Admitting: Oncology

## 2012-04-22 ENCOUNTER — Ambulatory Visit (HOSPITAL_BASED_OUTPATIENT_CLINIC_OR_DEPARTMENT_OTHER): Payer: Medicare Other | Admitting: Oncology

## 2012-04-22 ENCOUNTER — Other Ambulatory Visit (HOSPITAL_BASED_OUTPATIENT_CLINIC_OR_DEPARTMENT_OTHER): Payer: Medicare Other | Admitting: Lab

## 2012-04-22 VITALS — BP 126/71 | HR 72 | Temp 97.0°F | Resp 18 | Ht 71.0 in | Wt 194.5 lb

## 2012-04-22 DIAGNOSIS — C8589 Other specified types of non-Hodgkin lymphoma, extranodal and solid organ sites: Secondary | ICD-10-CM

## 2012-04-22 DIAGNOSIS — C8299 Follicular lymphoma, unspecified, extranodal and solid organ sites: Secondary | ICD-10-CM

## 2012-04-22 LAB — CBC WITH DIFFERENTIAL/PLATELET
BASO%: 0.6 % (ref 0.0–2.0)
EOS%: 3.1 % (ref 0.0–7.0)
HCT: 35.8 % — ABNORMAL LOW (ref 38.4–49.9)
LYMPH%: 30.4 % (ref 14.0–49.0)
MCH: 31.7 pg (ref 27.2–33.4)
MCHC: 34.3 g/dL (ref 32.0–36.0)
MCV: 92.6 fL (ref 79.3–98.0)
MONO#: 0.7 10*3/uL (ref 0.1–0.9)
MONO%: 10.2 % (ref 0.0–14.0)
NEUT%: 55.7 % (ref 39.0–75.0)
Platelets: 160 10*3/uL (ref 140–400)
RBC: 3.87 10*6/uL — ABNORMAL LOW (ref 4.20–5.82)

## 2012-04-22 LAB — COMPREHENSIVE METABOLIC PANEL (CC13)
ALT: 17 U/L (ref 0–55)
Alkaline Phosphatase: 67 U/L (ref 40–150)
CO2: 28 mEq/L (ref 22–29)
Creatinine: 0.8 mg/dL (ref 0.7–1.3)
Total Bilirubin: 0.35 mg/dL (ref 0.20–1.20)

## 2012-04-22 NOTE — Progress Notes (Signed)
Hematology and Oncology Follow Up Visit  Jack [name] [id number] 1928-10-30 78 y.o. 04/22/2012 1:42 PM   Principle Diagnosis: This is a 78 year old gentleman with grade 3 stage II follicular lymphoma diagnosed in 2007.  Prior Therapy: Patient treated with 6 cycles of CHOP with rituximab.  Therapy concluded August 2007.  He had a complete response and had been in remission since that time.  Current therapy: Observation and surveillance.  No evidence of any recurrent disease.  Interim History: Jack [name] presents today for a followup visit.  He has continued to very well without any evidence to suggest recurrent relapsed disease.  He is over 76 years out now from conclusion of chemotherapy and had not had any evidence to suggest recurrent relapsed disease.  He is not reporting any dysphagia or odynophagia.  He is not reporting any fevers, did not report any chills, did not report any constitutional symptoms.  He does report musculoskeletal cramps, especially in his calves.  He takes quinine, which seems to help his symptoms.  Otherwise performance status and activity level remain excellent. He recently had a left knee replacement and he is recovering well at this point.    Medications: I have reviewed the patient's current medications. Current outpatient prescriptions:albuterol (PROVENTIL HFA;VENTOLIN HFA) 108 (90 BASE) MCG/ACT inhaler, Inhale 2 puffs into the lungs every 6 (six) hours as needed. For shortness of breath, Disp: , Rfl: ;  alfuzosin (UROXATRAL) 10 MG 24 hr tablet, Take 10 mg by mouth every morning., Disp: , Rfl: ;  aspirin 81 MG tablet, Take 81 mg by mouth daily.  , Disp: , Rfl:  bisacodyl (DULCOLAX) 5 MG EC tablet, Take 1 tablet (5 mg total) by mouth daily as needed for constipation., Disp: 30 tablet, Rfl: 0;  celecoxib (CELEBREX) 100 MG capsule, Take 1 capsule (100 mg total) by mouth 2 (two) times daily., Disp: 60 capsule, Rfl: 0;  colchicine 0.6 MG tablet, Take 1 tablet (0.6 mg total)  by mouth daily., Disp: 60 tablet, Rfl: 0 diphenoxylate-atropine (LOMOTIL) 2.5-0.025 MG per tablet, Take 1 tablet by mouth 4 (four) times daily as needed. For loose stool, Disp: , Rfl: ;  dorzolamide (TRUSOPT) 2 % ophthalmic solution, Place 1 drop into both eyes 3 (three) times daily. NEW PRESCRIPTION, PT WILL NEED FILLED FOR ADMISSION, Disp: , Rfl: ;  esomeprazole (NEXIUM) 40 MG capsule, Take 40 mg by mouth daily before breakfast., Disp: , Rfl:  fexofenadine (ALLEGRA) 180 MG tablet, Take 180 mg by mouth daily as needed. For allergies, Disp: , Rfl: ;  Fluticasone-Salmeterol (ADVAIR) 250-50 MCG/DOSE AEPB, Inhale 1 puff into the lungs every 12 (twelve) hours., Disp: , Rfl: ;  Ginkgo Biloba 40 MG TABS, Take by mouth 2 (two) times daily as needed. Leg  cramps, Disp: , Rfl: ;  glucosamine-chondroitin 500-400 MG tablet, Take 1 tablet by mouth daily., Disp: , Rfl:  hyoscyamine (ANASPAZ) 0.125 MG TBDP, dissolve 1 tablet under the tongue every 2 hours if needed for bloating gas and CRAMPS, Disp: 30 tablet, Rfl: 2;  magnesium oxide (MAG-OX) 400 (241.3 MG) MG tablet, take 1 tablet by mouth once daily, Disp: 30 tablet, Rfl: 6;  metoprolol succinate (TOPROL-XL) 50 MG 24 hr tablet, Take 75 mg by mouth every morning. Take with or immediately following a meal., Disp: , Rfl:  oxyCODONE-acetaminophen (ROXICET) 5-325 MG per tablet, Take 1-2 tablets by mouth every 6 (six) hours as needed for pain., Disp: 75 tablet, Rfl: 0;  potassium chloride SA (K-DUR,KLOR-CON) 20 MEQ tablet, Take 20  mEq by mouth every morning., Disp: , Rfl: ;  quiNINE (QUALAQUIN) 324 MG capsule, Take 324 mg by mouth 2 (two) times daily., Disp: , Rfl:  sennosides-docusate sodium (SENOKOT-S) 8.6-50 MG tablet, Take 1 tablet by mouth daily., Disp: 30 tablet, Rfl: 1;  simvastatin (ZOCOR) 20 MG tablet, take 1 tablet by mouth at bedtime, Disp: 30 tablet, Rfl: 5;  temazepam (RESTORIL) 30 MG capsule, Take 30 mg by mouth at bedtime., Disp: , Rfl: ;  triamcinolone cream  (KENALOG) 0.1 %, Apply 1 application topically 2 (two) times daily. To rash on skin. Avoid face., Disp: , Rfl:  VIAGRA 100 MG tablet, take 1 tablet by mouth if needed for ERECTILE DYSFUNCTION 1/2 OR 1 TABLET AS NEEDED, Disp: 10 tablet, Rfl: 11  Allergies:  Allergies  Allergen Reactions  . Amoxicillin     REACTION: rash  . Penicillins Rash    Past Medical History, Surgical history, Social history, and Family History were reviewed and updated.  Review of Systems: Constitutional:  Negative for fever, chills, night sweats, anorexia, weight loss, pain. Cardiovascular: no chest pain or dyspnea on exertion Respiratory: negative Neurological: negative Dermatological: negative ENT: negative Skin: Negative. Gastrointestinal: negative Genito-Urinary: negative Hematological and Lymphatic: negative Breast: negative Musculoskeletal: negative Remaining ROS negative. Physical Exam: Blood pressure 126/71, pulse 72, temperature 97 F (36.1 C), temperature source Oral, resp. rate 18, height 5\' 11"  (1.803 m), weight 194 lb 8 oz (88.225 kg). ECOG: 1  General appearance: alert Head: Normocephalic, without obvious abnormality, atraumatic Neck: no adenopathy, no carotid bruit, no JVD, supple, symmetrical, trachea midline and thyroid not enlarged, symmetric, no tenderness/mass/nodules Lymph nodes: Cervical, supraclavicular, and axillary nodes normal. Heart:regular rate and rhythm, S1, S2 normal, no murmur, click, rub or gallop Lung:chest clear, no wheezing, rales, normal symmetric air entry Abdomin: soft, non-tender, without masses or organomegaly EXT:no erythema, induration, or nodules   Lab Results: Lab Results  Component Value Date   WBC 6.9 04/22/2012   HGB 12.3* 04/22/2012   HCT 35.8* 04/22/2012   MCV 92.6 04/22/2012   PLT 160 04/22/2012     Chemistry      Component Value Date/Time   NA 131* 03/18/2012 0400   K 4.2 03/18/2012 0400   CL 96 03/18/2012 0400   CO2 27 03/18/2012 0400    BUN 11 03/18/2012 0400   CREATININE 0.82 03/18/2012 0400      Component Value Date/Time   CALCIUM 8.5 03/18/2012 0400   ALKPHOS 46 12/17/2011 1441   ALKPHOS 46 12/17/2011 1441   AST 30 12/17/2011 1441   AST 30 12/17/2011 1441   ALT 25 12/17/2011 1441   ALT 25 12/17/2011 1441   BILITOT 0.5 12/17/2011 1441   BILITOT 0.5 12/17/2011 1441      Impression and Plan:  A pleasant 78 year old gentleman with the following issues:   1. Grade 3 stage II follicular lymphoma, status post CHOP with rituximab.  Now he is over 5 years out from the conclusion of his chemotherapy.  No evidence to suggest recurrent disease.  At the time being, I would like to keep him on active surveillance.  However, I will stretch out his followups to every 12 months  and repeat imaging studies as needed.  2. Next followup will be in 03/2013.  Eli Hose, MD 10/30/20131:42 PM

## 2012-04-22 NOTE — Telephone Encounter (Signed)
appts made and printed for pt aom °

## 2012-04-23 ENCOUNTER — Other Ambulatory Visit: Payer: Self-pay | Admitting: Internal Medicine

## 2012-05-11 ENCOUNTER — Other Ambulatory Visit: Payer: Self-pay | Admitting: Internal Medicine

## 2012-05-11 MED ORDER — POTASSIUM CHLORIDE CRYS ER 20 MEQ PO TBCR: 20.0000 meq | EXTENDED_RELEASE_TABLET | Freq: Every morning | ORAL | Status: DC

## 2012-05-13 ENCOUNTER — Other Ambulatory Visit: Payer: Self-pay | Admitting: Internal Medicine

## 2012-06-24 HISTORY — PX: EYE SURGERY: SHX253

## 2012-06-30 ENCOUNTER — Telehealth: Payer: Self-pay | Admitting: Internal Medicine

## 2012-06-30 NOTE — Telephone Encounter (Signed)
Forward  2 pages from [facility] to Dr. Illene Regulus for review on 06-30-12 ym

## 2012-07-26 ENCOUNTER — Other Ambulatory Visit: Payer: Self-pay | Admitting: Internal Medicine

## 2012-08-03 ENCOUNTER — Other Ambulatory Visit: Payer: Self-pay | Admitting: Internal Medicine

## 2012-08-17 ENCOUNTER — Other Ambulatory Visit: Payer: Self-pay | Admitting: Internal Medicine

## 2012-08-17 NOTE — Telephone Encounter (Signed)
[place] Refill. Pt last seen on 12/17/11. Last see an Rx on 6/12. Please advise.

## 2012-08-26 ENCOUNTER — Other Ambulatory Visit: Payer: Self-pay | Admitting: Internal Medicine

## 2012-08-27 ENCOUNTER — Other Ambulatory Visit: Payer: Self-pay | Admitting: *Deleted

## 2012-08-27 MED ORDER — SIMVASTATIN 20 MG PO TABS: 20.0000 mg | ORAL_TABLET | Freq: Every day | ORAL | Status: DC

## 2012-08-27 MED ORDER — TEMAZEPAM 30 MG PO CAPS: 30.0000 mg | ORAL_CAPSULE | Freq: Every day | ORAL | Status: DC

## 2012-09-01 ENCOUNTER — Other Ambulatory Visit: Payer: Self-pay | Admitting: Internal Medicine

## 2012-09-07 ENCOUNTER — Encounter (INDEPENDENT_AMBULATORY_CARE_PROVIDER_SITE_OTHER): Payer: Self-pay | Admitting: Ophthalmology

## 2012-09-07 DIAGNOSIS — H43819 Vitreous degeneration, unspecified eye: Secondary | ICD-10-CM

## 2012-09-07 DIAGNOSIS — H3530 Unspecified macular degeneration: Secondary | ICD-10-CM

## 2012-09-07 DIAGNOSIS — I10 Essential (primary) hypertension: Secondary | ICD-10-CM

## 2012-09-07 DIAGNOSIS — H35039 Hypertensive retinopathy, unspecified eye: Secondary | ICD-10-CM

## 2012-10-12 ENCOUNTER — Encounter (INDEPENDENT_AMBULATORY_CARE_PROVIDER_SITE_OTHER): Payer: Medicare Other | Admitting: Ophthalmology

## 2012-10-12 DIAGNOSIS — H3530 Unspecified macular degeneration: Secondary | ICD-10-CM

## 2012-10-12 DIAGNOSIS — H353290 Exudative age-related macular degeneration, unspecified eye, stage unspecified: Secondary | ICD-10-CM

## 2012-10-12 DIAGNOSIS — H35039 Hypertensive retinopathy, unspecified eye: Secondary | ICD-10-CM

## 2012-10-12 DIAGNOSIS — H43819 Vitreous degeneration, unspecified eye: Secondary | ICD-10-CM

## 2012-10-12 DIAGNOSIS — I10 Essential (primary) hypertension: Secondary | ICD-10-CM

## 2012-10-19 ENCOUNTER — Other Ambulatory Visit: Payer: Self-pay | Admitting: Internal Medicine

## 2012-11-06 ENCOUNTER — Encounter (INDEPENDENT_AMBULATORY_CARE_PROVIDER_SITE_OTHER): Payer: Medicare Other | Admitting: Ophthalmology

## 2012-11-06 DIAGNOSIS — I10 Essential (primary) hypertension: Secondary | ICD-10-CM

## 2012-11-06 DIAGNOSIS — H43819 Vitreous degeneration, unspecified eye: Secondary | ICD-10-CM

## 2012-11-06 DIAGNOSIS — H3530 Unspecified macular degeneration: Secondary | ICD-10-CM

## 2012-11-06 DIAGNOSIS — H353290 Exudative age-related macular degeneration, unspecified eye, stage unspecified: Secondary | ICD-10-CM

## 2012-11-06 DIAGNOSIS — H35039 Hypertensive retinopathy, unspecified eye: Secondary | ICD-10-CM

## 2012-11-21 ENCOUNTER — Other Ambulatory Visit: Payer: Self-pay | Admitting: Internal Medicine

## 2012-12-04 ENCOUNTER — Encounter (INDEPENDENT_AMBULATORY_CARE_PROVIDER_SITE_OTHER): Payer: Medicare Other | Admitting: Ophthalmology

## 2012-12-04 ENCOUNTER — Other Ambulatory Visit: Payer: Self-pay | Admitting: Internal Medicine

## 2012-12-04 DIAGNOSIS — H353290 Exudative age-related macular degeneration, unspecified eye, stage unspecified: Secondary | ICD-10-CM

## 2012-12-04 DIAGNOSIS — I10 Essential (primary) hypertension: Secondary | ICD-10-CM

## 2012-12-04 DIAGNOSIS — H43819 Vitreous degeneration, unspecified eye: Secondary | ICD-10-CM

## 2012-12-04 DIAGNOSIS — H35039 Hypertensive retinopathy, unspecified eye: Secondary | ICD-10-CM

## 2012-12-04 DIAGNOSIS — H3530 Unspecified macular degeneration: Secondary | ICD-10-CM

## 2012-12-05 ENCOUNTER — Other Ambulatory Visit: Payer: Self-pay | Admitting: Internal Medicine

## 2012-12-08 ENCOUNTER — Ambulatory Visit (INDEPENDENT_AMBULATORY_CARE_PROVIDER_SITE_OTHER): Payer: Medicare Other | Admitting: Internal Medicine

## 2012-12-08 ENCOUNTER — Encounter: Payer: Self-pay | Admitting: Internal Medicine

## 2012-12-08 VITALS — BP 144/70 | HR 60 | Temp 97.0°F | Resp 12 | Ht 71.0 in | Wt 200.0 lb

## 2012-12-08 DIAGNOSIS — H353230 Exudative age-related macular degeneration, bilateral, stage unspecified: Secondary | ICD-10-CM

## 2012-12-08 DIAGNOSIS — C8589 Other specified types of non-Hodgkin lymphoma, extranodal and solid organ sites: Secondary | ICD-10-CM

## 2012-12-08 DIAGNOSIS — H353290 Exudative age-related macular degeneration, unspecified eye, stage unspecified: Secondary | ICD-10-CM

## 2012-12-08 DIAGNOSIS — M1711 Unilateral primary osteoarthritis, right knee: Secondary | ICD-10-CM

## 2012-12-08 DIAGNOSIS — J438 Other emphysema: Secondary | ICD-10-CM

## 2012-12-08 DIAGNOSIS — M1710 Unilateral primary osteoarthritis, unspecified knee: Secondary | ICD-10-CM

## 2012-12-08 DIAGNOSIS — R252 Cramp and spasm: Secondary | ICD-10-CM

## 2012-12-08 MED ORDER — CELECOXIB 100 MG PO CAPS: ORAL_CAPSULE | ORAL | Status: DC

## 2012-12-08 MED ORDER — TRIAMCINOLONE ACETONIDE 0.1 % EX CREA: TOPICAL_CREAM | CUTANEOUS | Status: DC

## 2012-12-08 NOTE — Progress Notes (Signed)
Subjective:    Patient ID: Jack [name], male    DOB: [birth year], 79 y.o.   MRN: [id number]  HPI Jack [name] presents for general follow up. He has had a left TKR that went well. He is considering having right TKR in the future. He had cataracts bilaterally with IOL and he had follow up laser treatment for lens clouding. He is having intraorbital injections for wet macular degeneration by Dr. Jerolyn Center - he feels this is helping. He sees [name] annually with an appointment in October. He sees Dr. Annabell Howells q6 months for elevated PSA.  PMH, FamHx and SocHx reviewed for any changes and relevance. Current Outpatient Prescriptions on File Prior to Visit  Medication Sig Dispense Refill  . ADVAIR DISKUS 250-50 MCG/DOSE AEPB inhale 1 dose by mouth every 12 hours  60 each  5  . albuterol (PROVENTIL HFA;VENTOLIN HFA) 108 (90 BASE) MCG/ACT inhaler Inhale 2 puffs into the lungs every 6 (six) hours as needed. For shortness of breath      . alfuzosin (UROXATRAL) 10 MG 24 hr tablet Take 10 mg by mouth every morning.      Marland Kitchen aspirin 81 MG tablet Take 81 mg by mouth daily.        Marland Kitchen BESIVANCE 0.6 % SUSP       . celecoxib (CELEBREX) 100 MG capsule Take 1 capsule (100 mg total) by mouth 2 (two) times daily.  60 capsule  0  . ciprofloxacin (CIPRO) 500 MG tablet       . diphenoxylate-atropine (LOMOTIL) 2.5-0.025 MG per tablet Take 1 tablet by mouth 4 (four) times daily as needed. For loose stool      . dorzolamide (TRUSOPT) 2 % ophthalmic solution Place 1 drop into both eyes 3 (three) times daily. NEW PRESCRIPTION, PT WILL NEED FILLED FOR ADMISSION      . fexofenadine (ALLEGRA) 180 MG tablet Take 180 mg by mouth daily as needed. For allergies      . Ginkgo Biloba 40 MG TABS Take by mouth 2 (two) times daily as needed. Leg  cramps      . hyoscyamine (ANASPAZ) 0.125 MG TBDP dissolve 1 tablet under the tongue every 2 hours if needed for bloating gas and CRAMPS  30 tablet  2  . KLOR-CON M20 20 MEQ tablet take 1 tablet by  mouth once daily  30 each  5  . magnesium oxide (MAG-OX) 400 (241.3 MG) MG tablet take 1 tablet by mouth once daily  30 tablet  6  . metoprolol succinate (TOPROL-XL) 50 MG 24 hr tablet Take 75 mg by mouth every morning. Take with or immediately following a meal.      . metoprolol succinate (TOPROL-XL) 50 MG 24 hr tablet take 1 and 1/2 tablets by mouth once daily  45 tablet  5  . NEXIUM 40 MG capsule take 1 capsule by mouth once daily BEFORE BREAKFAST  30 each  5  . NEXIUM 40 MG capsule take 1 capsule by mouth once daily BEFORE BREAKFAST  30 capsule  5  . oxybutynin (DITROPAN) 5 MG tablet       . oxyCODONE-acetaminophen (ROXICET) 5-325 MG per tablet Take 1-2 tablets by mouth every 6 (six) hours as needed for pain.  75 tablet  0  . potassium chloride SA (K-DUR,KLOR-CON) 20 MEQ tablet Take 1 tablet (20 mEq total) by mouth every morning.  30 tablet  1  . PROAIR HFA 108 (90 BASE) MCG/ACT inhaler USE AS DIRECTED IF NEEDED  18 g  6  . quiNINE (QUALAQUIN) 324 MG capsule Take 324 mg by mouth 2 (two) times daily.      . simvastatin (ZOCOR) 20 MG tablet Take 1 tablet (20 mg total) by mouth at bedtime.  30 tablet  3  . temazepam (RESTORIL) 30 MG capsule Take 1 capsule (30 mg total) by mouth at bedtime.  30 capsule  3  . VIAGRA 100 MG tablet take 1 tablet by mouth if needed for ERECTILE DYSFUNCTION 1/2 OR 1 TABLET AS NEEDED  10 tablet  11   No current facility-administered medications on file prior to visit.        Review of Systems System review is negative for any constitutional, cardiac, pulmonary, GI or neuro symptoms or complaints other than as described in the HPI.     Objective:   Physical Exam Filed Vitals:   12/08/12 1037  BP: 144/70  Pulse: 60  Temp: 97 F (36.1 C)  Resp: 12   Gen'l - mildly overweight white man in no distress Cor- 2+ radial RRR Pulm - CTAP  Neuro - normal       Assessment & Plan:

## 2012-12-10 ENCOUNTER — Encounter: Payer: Self-pay | Admitting: Internal Medicine

## 2012-12-10 DIAGNOSIS — H353230 Exudative age-related macular degeneration, bilateral, stage unspecified: Secondary | ICD-10-CM | POA: Insufficient documentation

## 2012-12-10 NOTE — Assessment & Plan Note (Signed)
Continues to be plagued by leg cramps despite use of quinine and cinchona products. Previous lab work has been normal. He does stretch and exercise.  Plan No further testing at this time.

## 2012-12-10 NOTE — Assessment & Plan Note (Signed)
Stable - remains active. No respiratory distress but a little more limited in activity

## 2012-12-10 NOTE — Assessment & Plan Note (Signed)
H/o DJD elsewhere and s/p TKR left. Now with right knee pain.  Plan He will consult with Dr. Dion Saucier for TKR right in the fall '14

## 2012-12-10 NOTE — Assessment & Plan Note (Signed)
Holding in remission. Will follow up with [name] as scheduled

## 2012-12-11 ENCOUNTER — Encounter (INDEPENDENT_AMBULATORY_CARE_PROVIDER_SITE_OTHER): Payer: Medicare Other | Admitting: Ophthalmology

## 2012-12-11 DIAGNOSIS — H43819 Vitreous degeneration, unspecified eye: Secondary | ICD-10-CM

## 2012-12-11 DIAGNOSIS — H35039 Hypertensive retinopathy, unspecified eye: Secondary | ICD-10-CM

## 2012-12-11 DIAGNOSIS — I10 Essential (primary) hypertension: Secondary | ICD-10-CM

## 2012-12-11 DIAGNOSIS — H3530 Unspecified macular degeneration: Secondary | ICD-10-CM

## 2012-12-31 ENCOUNTER — Ambulatory Visit (INDEPENDENT_AMBULATORY_CARE_PROVIDER_SITE_OTHER): Payer: Medicare Other | Admitting: Internal Medicine

## 2012-12-31 ENCOUNTER — Encounter: Payer: Self-pay | Admitting: Internal Medicine

## 2012-12-31 ENCOUNTER — Other Ambulatory Visit (INDEPENDENT_AMBULATORY_CARE_PROVIDER_SITE_OTHER): Payer: Medicare Other

## 2012-12-31 VITALS — BP 118/60 | HR 75 | Temp 97.5°F | Resp 16 | Ht 71.0 in | Wt 202.0 lb

## 2012-12-31 DIAGNOSIS — M1712 Unilateral primary osteoarthritis, left knee: Secondary | ICD-10-CM

## 2012-12-31 DIAGNOSIS — H353230 Exudative age-related macular degeneration, bilateral, stage unspecified: Secondary | ICD-10-CM

## 2012-12-31 DIAGNOSIS — K219 Gastro-esophageal reflux disease without esophagitis: Secondary | ICD-10-CM

## 2012-12-31 DIAGNOSIS — M1710 Unilateral primary osteoarthritis, unspecified knee: Secondary | ICD-10-CM

## 2012-12-31 DIAGNOSIS — R252 Cramp and spasm: Secondary | ICD-10-CM

## 2012-12-31 DIAGNOSIS — H353290 Exudative age-related macular degeneration, unspecified eye, stage unspecified: Secondary | ICD-10-CM

## 2012-12-31 DIAGNOSIS — Z0000 Encounter for general adult medical examination without abnormal findings: Secondary | ICD-10-CM

## 2012-12-31 DIAGNOSIS — E785 Hyperlipidemia, unspecified: Secondary | ICD-10-CM

## 2012-12-31 DIAGNOSIS — J438 Other emphysema: Secondary | ICD-10-CM

## 2012-12-31 DIAGNOSIS — Z01810 Encounter for preprocedural cardiovascular examination: Secondary | ICD-10-CM

## 2012-12-31 LAB — COMPREHENSIVE METABOLIC PANEL
AST: 35 U/L (ref 0–37)
Albumin: 4.1 g/dL (ref 3.5–5.2)
BUN: 14 mg/dL (ref 6–23)
Calcium: 8.9 mg/dL (ref 8.4–10.5)
Chloride: 100 mEq/L (ref 96–112)
Potassium: 4.3 mEq/L (ref 3.5–5.1)
Total Protein: 7.1 g/dL (ref 6.0–8.3)

## 2012-12-31 LAB — HEPATIC FUNCTION PANEL
Albumin: 4.1 g/dL (ref 3.5–5.2)
Alkaline Phosphatase: 52 U/L (ref 39–117)
Bilirubin, Direct: 0.1 mg/dL (ref 0.0–0.3)

## 2012-12-31 LAB — LIPID PANEL
Total CHOL/HDL Ratio: 3
Triglycerides: 142 mg/dL (ref 0.0–149.0)

## 2012-12-31 LAB — HEMOGLOBIN AND HEMATOCRIT, BLOOD: Hemoglobin: 13 g/dL (ref 13.0–17.0)

## 2012-12-31 MED ORDER — FLUTICASONE-SALMETEROL 500-50 MCG/DOSE IN AEPB: 1.0000 | INHALATION_SPRAY | Freq: Two times a day (BID) | RESPIRATORY_TRACT | Status: DC

## 2012-12-31 NOTE — Patient Instructions (Addendum)
Thanks for coming in.  We will evaluate your pulmonary function and you will be called about an appointment  Exam is good. Reviewed your chart and you had an Echo in '13 which was normal.  You are cleared for surgery  Try to loose weight.  Lab and studies will be available on MyChart for you.

## 2012-12-31 NOTE — Progress Notes (Signed)
Subjective:    Patient ID: Jack [name], male    DOB: [birth year], 79 y.o.   MRN: [id number]  HPI The patient is here for annual Medicare wellness examination and management of other chronic and acute problems. He is just back from a trip to [place] and a cruise on the [place].  He continues to have intra-orbital injections with Avastin for macular degeneration per Dr. Ashley Royalty.  Definitely going to have TKR right - needs surgical clearance.   The risk factors are reflected in the social history.  The roster of all physicians providing medical care to patient - is listed in the Snapshot section of the chart.  Activities of daily living:  The patient is 100% inedpendent in all ADLs: dressing, toileting, feeding as well as independent mobility  Home safety : The patient has smoke detectors in the home. Fall - none and home is fall safe.  They wear seatbelts. No firearms at home. There is no violence in the home.   There is no risks for hepatitis, STDs or HIV. There is no history of blood transfusion. They have no travel history to infectious disease endemic areas of the world.  The patient has seen their dentist in the last six month. They have seen their eye doctor in the last year. They admit to any hearing difficulty, wearing in ear amplifiers and have had audiologic testing in the last year.    They do not  have excessive sun exposure. Discussed the need for sun protection: hats, long sleeves and use of sunscreen if there is significant sun exposure.   Diet: the importance of a healthy diet is discussed. They do have a healthy diet.  The patient has a regular exercise program: gym workouts  , 60 min duration, 2 per week.  The benefits of regular aerobic exercise were discussed.  Depression screen: there are no signs or vegative symptoms of depression- irritability, change in appetite, anhedonia, sadness/tearfullness.  Cognitive assessment: the patient manages all their financial  and personal affairs and is actively engaged.   The following portions of the patient's history were reviewed and updated as appropriate: allergies, current medications, past family history, past medical history,  past surgical history, past social history  and problem list.  Vision, hearing, body mass index were assessed and reviewed.   During the course of the visit the patient was educated and counseled about appropriate screening and preventive services including : fall prevention , diabetes screening, nutrition counseling, colorectal cancer screening, and recommended immunizations.  Past Medical History  Diagnosis Date  . GERD (gastroesophageal reflux disease)   . Osteoarthritis   . Alcohol abuse     in recovery 2006  . 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 wine only  . Irritable bowel   . Hyperlipidemia   . Insomnia   . PAC (premature atrial contraction)     stable (ruled out for a fib)  . Prostatitis     chronic bacterial  . Pseudogout   . Sinus problem     chronic  . Cellulitis     2nd to T. Pedis(not at present)  . Hearing loss 03-10-12    bilateral hearing aids  . Complication of anesthesia 03-10-12    after shoulder  surgery -difficulty awakening,breathing problems  . Emphysema 03-10-12    tx. inhalers, uses steam room frequently  . Cramping of feet 03-10-12    cramping of both legs and hands occ.  Marland Kitchen Non  Hodgkin's lymphoma 03-10-12     '07-1 yr. in remission(Shadad)-not seeing now  . Osteoarthritis of left knee 03/17/2012  . Glaucoma   . Macular degeneration    Past Surgical History  Procedure Laterality Date  . Esophagogastroduodenoscopy  01-27-02  . Inguinal hernia repair  1979  . Torn biceps repair  03-10-12    Left rotator cuff repair  . Hernia repair    . Cataract surgery  03-10-12    03-04-12(right)/ (03-11-12-left)  . Total knee arthroplasty  03/17/2012    Procedure: TOTAL KNEE ARTHROPLASTY;  Surgeon: Eulas Post, MD;   Location: [facility];  Service: Orthopedics;  Laterality: Left;  Marland Kitchen Eye surgery  2014    cataract extraction with IOL both eyes staged   Family History  Problem Relation Age of Onset  . Coronary artery disease Father   . Heart attack Father   . Cancer Brother     colon  . Other Other     TB   History   Social History  . Marital Status: Widowed    Spouse Name: N/A    Number of Children: 3  . Years of Education: 18   Occupational History  . newspaper Programmer, multimedia     [employer]   Social History Main Topics  . Smoking status: Former Smoker    Types: Cigarettes    Quit date: 06/24/1978  . Smokeless tobacco: Never Used  . Alcohol Use: 2.5 oz/week    5 drink(s) per week     Comment: Currently only drinks wine - in moderation. He is cutting back-past hx. ETOH abuse  . Drug Use: No  . Sexually Active: Yes   Other Topics Concern  . Not on file   Social History Narrative   [employer] [place]. married '56- widowed '07. 1 son, 2 daughters, 4 grandchildren   work: former Counsellor; very active in civic affairs. lives alone: [place] ('12), keeps up his [place] house; takes his extended family on great trips - [place] and [place] July '12. Socially active.  Active in AA      End of Life issues: He does want CPR; no prolonged intubation; no futile or heroic measure to maintain him if the quality of life isn't good. HCPOA  Son - Jeron Grahn ([phone].     Current Outpatient Prescriptions on File Prior to Visit  Medication Sig Dispense Refill  . ADVAIR DISKUS 250-50 MCG/DOSE AEPB inhale 1 dose by mouth every 12 hours  60 each  5  . albuterol (PROVENTIL HFA;VENTOLIN HFA) 108 (90 BASE) MCG/ACT inhaler Inhale 2 puffs into the lungs every 6 (six) hours as needed. For shortness of breath      . alfuzosin (UROXATRAL) 10 MG 24 hr tablet Take 10 mg by mouth every morning.      Marland Kitchen aspirin 81 MG tablet Take 81  mg by mouth daily.        Marland Kitchen BESIVANCE 0.6 % SUSP       . celecoxib (CELEBREX) 100 MG capsule Take 1 capsule (100 mg total) by mouth 2 (two) times daily.  60 capsule  0  . celecoxib (CELEBREX) 100 MG capsule take 1 capsule by mouth twice a day  60 capsule  11  . ciprofloxacin (CIPRO) 500 MG tablet       . diphenoxylate-atropine (LOMOTIL) 2.5-0.025 MG per tablet Take 1 tablet by mouth 4 (four) times daily as needed. For loose stool      .  dorzolamide (TRUSOPT) 2 % ophthalmic solution Place 1 drop into both eyes 3 (three) times daily. NEW PRESCRIPTION, PT WILL NEED FILLED FOR ADMISSION      . fexofenadine (ALLEGRA) 180 MG tablet Take 180 mg by mouth daily as needed. For allergies      . Ginkgo Biloba 40 MG TABS Take by mouth 2 (two) times daily as needed. Leg  cramps      . hyoscyamine (ANASPAZ) 0.125 MG TBDP dissolve 1 tablet under the tongue every 2 hours if needed for bloating gas and CRAMPS  30 tablet  2  . KLOR-CON M20 20 MEQ tablet take 1 tablet by mouth once daily  30 each  5  . magnesium oxide (MAG-OX) 400 (241.3 MG) MG tablet take 1 tablet by mouth once daily  30 tablet  6  . metoprolol succinate (TOPROL-XL) 50 MG 24 hr tablet Take 75 mg by mouth every morning. Take with or immediately following a meal.      . metoprolol succinate (TOPROL-XL) 50 MG 24 hr tablet take 1 and 1/2 tablets by mouth once daily  45 tablet  5  . NEXIUM 40 MG capsule take 1 capsule by mouth once daily BEFORE BREAKFAST  30 each  5  . NEXIUM 40 MG capsule take 1 capsule by mouth once daily BEFORE BREAKFAST  30 capsule  5  . oxybutynin (DITROPAN) 5 MG tablet       . oxyCODONE-acetaminophen (ROXICET) 5-325 MG per tablet Take 1-2 tablets by mouth every 6 (six) hours as needed for pain.  75 tablet  0  . potassium chloride SA (K-DUR,KLOR-CON) 20 MEQ tablet Take 1 tablet (20 mEq total) by mouth every morning.  30 tablet  1  . PROAIR HFA 108 (90 BASE) MCG/ACT inhaler USE AS DIRECTED IF NEEDED  18 g  6  . quiNINE (QUALAQUIN)  324 MG capsule Take 324 mg by mouth 2 (two) times daily.      . simvastatin (ZOCOR) 20 MG tablet Take 1 tablet (20 mg total) by mouth at bedtime.  30 tablet  3  . temazepam (RESTORIL) 30 MG capsule Take 1 capsule (30 mg total) by mouth at bedtime.  30 capsule  3  . triamcinolone cream (KENALOG) 0.1 % APPLY TO SKIN RASH TWICE A DAY AS NEEDED ( AVOID USE ON FACE)  80 g  3  . VIAGRA 100 MG tablet take 1 tablet by mouth if needed for ERECTILE DYSFUNCTION 1/2 OR 1 TABLET AS NEEDED  10 tablet  11   No current facility-administered medications on file prior to visit.     Review of Systems Constitutional:  Negative for fever, chills, activity change and unexpected weight change.  HEENT:  Negative for hearing loss, ear pain, congestion, neck stiffness and postnasal drip. Negative for sore throat or swallowing problems. Negative for dental complaints.   Eyes: Negative for vision loss or change in visual acuity.  Respiratory: Positive for chest tightness and wheezing. positive for DOE.   Cardiovascular: Negative for chest pain or palpitations. No decreased exercise tolerance Gastrointestinal: No change in bowel habit. No bloating or gas. No reflux or indigestion Genitourinary:Positive for urgency, frequency, and difficulty urinating. Does have post-void trouble Musculoskeletal: Negative for myalgias, back pain, arthralgias and gait problem. With continued cramps. Neurological: Negative for dizziness, tremors, weakness and headaches.  Hematological: Negative for adenopathy.  Psychiatric/Behavioral: Negative for behavioral problems and dysphoric mood.       Objective:   Physical Exam Filed Vitals:   12/31/12  1314  BP: 118/60  Pulse: 75  Temp: 97.5 F (36.4 C)  Resp: 16   Wt Readings from Last 3 Encounters:  12/31/12 202 lb (91.627 kg)  12/08/12 200 lb (90.719 kg)  04/22/12 194 lb 8 oz (88.225 kg)   Gen'l: Well nourished well developed white male in no acute distress  HEENT: Head:  Normocephalic and atraumatic. Right Ear: External ear normal. EAC/TM nl. Left Ear: External ear normal.  EAC/TM nl. Nose: Nose normal. Mouth/Throat: Oropharynx is clear and moist. Dentition - native, in good repair. No buccal or palatal lesions. Posterior pharynx clear. Eyes: Conjunctivae and sclera clear. EOM intact. Pupils are equal, round, and reactive to light. Right eye exhibits no discharge. Left eye exhibits no discharge. Neck: Normal range of motion. Neck supple. No JVD present. No tracheal deviation present. No thyromegaly present.  Cardiovascular: Normal rate, regular rhythm, no gallop, no friction rub, no murmur heard.      Quiet precordium. 2+ radial and DP pulses . No carotid bruits Pulmonary/Chest: Effort normal. No respiratory distress or increased WOB, no wheezes, no rales. No chest wall deformity or CVAT. Abdomen: Soft. Bowel sounds are normal in all quadrants. He exhibits no distension, no tenderness, no rebound or guarding, No heptosplenomegaly  Genitourinary:  deferred to GU Musculoskeletal: Normal range of motion. He exhibits no edema and no tenderness.       Small and large joints without redness, synovial thickening or deformity. Full range of motion preserved about all small, median and large joints.  Lymphadenopathy:    He has no cervical or supraclavicular adenopathy.  Neurological: He is alert and oriented to person, place, and time. CN II-XII intact. DTRs 2+ and symmetrical biceps, radial and patellar tendons. Cerebellar function normal with no tremor, rigidity, normal gait and station.  Skin: Skin is warm and dry. No rash noted. No erythema.  Psychiatric: He has a normal mood and affect. His behavior is normal. Thought content normal.   Recent Results (from the past 2160 hour(s))  HEPATIC FUNCTION PANEL     Status: None   Collection Time    12/31/12  2:33 PM      Result Value Range   Total Bilirubin 0.6  0.3 - 1.2 mg/dL   Bilirubin, Direct 0.1  0.0 - 0.3 mg/dL    Alkaline Phosphatase 52  39 - 117 U/L   AST 35  0 - 37 U/L   ALT 29  0 - 53 U/L   Total Protein 7.1  6.0 - 8.3 g/dL   Albumin 4.1  3.5 - 5.2 g/dL  TSH     Status: None   Collection Time    12/31/12  2:33 PM      Result Value Range   TSH 1.15  0.35 - 5.50 uIU/mL  COMPREHENSIVE METABOLIC PANEL     Status: Abnormal   Collection Time    12/31/12  2:33 PM      Result Value Range   Sodium 134 (*) 135 - 145 mEq/L   Potassium 4.3  3.5 - 5.1 mEq/L   Chloride 100  96 - 112 mEq/L   CO2 34 (*) 19 - 32 mEq/L   Glucose, Bld 100 (*) 70 - 99 mg/dL   BUN 14  6 - 23 mg/dL   Creatinine, Ser 1.0  0.4 - 1.5 mg/dL   Total Bilirubin 0.6  0.3 - 1.2 mg/dL   Alkaline Phosphatase 52  39 - 117 U/L   AST 35  0 - 37 U/L  ALT 29  0 - 53 U/L   Total Protein 7.1  6.0 - 8.3 g/dL   Albumin 4.1  3.5 - 5.2 g/dL   Calcium 8.9  8.4 - 16.1 mg/dL   GFR 09.60  >45.40 mL/min  LIPID PANEL     Status: None   Collection Time    12/31/12  2:33 PM      Result Value Range   Cholesterol 157  0 - 200 mg/dL   Comment: ATP III Classification       Desirable:  < 200 mg/dL               Borderline High:  200 - 239 mg/dL          High:  > = 981 mg/dL   Triglycerides 191.4  0.0 - 149.0 mg/dL   Comment: Normal:  <782 mg/dLBorderline High:  150 - 199 mg/dL   HDL 95.62  >13.08 mg/dL   VLDL 65.7  0.0 - 84.6 mg/dL   LDL Cholesterol 66  0 - 99 mg/dL   Total CHOL/HDL Ratio 3     Comment:                Men          Women1/2 Average Risk     3.4          3.3Average Risk          5.0          4.42X Average Risk          9.6          7.13X Average Risk          15.0          11.0                      HEMOGLOBIN AND HEMATOCRIT, BLOOD     Status: Abnormal   Collection Time    12/31/12  2:33 PM      Result Value Range   Hemoglobin 13.0  13.0 - 17.0 g/dL   HCT 96.2 (*) 95.2 - 84.1 %           Assessment & Plan:

## 2013-01-01 ENCOUNTER — Encounter (INDEPENDENT_AMBULATORY_CARE_PROVIDER_SITE_OTHER): Payer: Medicare Other | Admitting: Ophthalmology

## 2013-01-01 DIAGNOSIS — H35039 Hypertensive retinopathy, unspecified eye: Secondary | ICD-10-CM

## 2013-01-01 DIAGNOSIS — H3530 Unspecified macular degeneration: Secondary | ICD-10-CM

## 2013-01-01 DIAGNOSIS — I10 Essential (primary) hypertension: Secondary | ICD-10-CM

## 2013-01-01 DIAGNOSIS — H353290 Exudative age-related macular degeneration, unspecified eye, stage unspecified: Secondary | ICD-10-CM

## 2013-01-01 DIAGNOSIS — H43819 Vitreous degeneration, unspecified eye: Secondary | ICD-10-CM

## 2013-01-03 NOTE — Assessment & Plan Note (Signed)
He is ready for second TKR. He will schedule his own appointment with Dr. Dion Saucier - fall is a good time for him to have his surgery.  Based on review of his record and exam he is medically cleared for surgery.

## 2013-01-03 NOTE — Assessment & Plan Note (Signed)
Continues with intra-orbital injections with some improvement. He has also developed floaters in the left eye.  Plan Per Dr. Jerolyn Center

## 2013-01-03 NOTE — Assessment & Plan Note (Signed)
No report of symptoms, normal exam and he is very active.  No further testing needed preoperatively.

## 2013-01-03 NOTE — Assessment & Plan Note (Addendum)
Stable with little progression of dyspnea with exertion. 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 No PFTs for a long time  Plan Full PFTs pre and post bronchodilators - looking for treatable defect: bronchodilator vs inhalational steroid.

## 2013-01-03 NOTE — Assessment & Plan Note (Signed)
Interval history is note worthy for progressive knee pain and DOE. Physical exam is OK except for weight. He is current with colonoscopy and immunizations are up to date. Overall he seems to be doing well.  He will return as needed. PFT report will be forwarded via MyChart. If he should be in-patient for knee surgery will be happy to follow along.

## 2013-01-03 NOTE — Assessment & Plan Note (Signed)
Taking and tolerating medication w/o adverse affects.  Plan Follow up lab  Addendum  LDL is excellent. Liver functions OK

## 2013-01-15 ENCOUNTER — Other Ambulatory Visit: Payer: Self-pay | Admitting: Internal Medicine

## 2013-01-18 NOTE — Telephone Encounter (Signed)
Temazepam called to pharmacy  

## 2013-01-26 ENCOUNTER — Ambulatory Visit (INDEPENDENT_AMBULATORY_CARE_PROVIDER_SITE_OTHER)
Admission: RE | Admit: 2013-01-26 | Discharge: 2013-01-26 | Disposition: A | Discharge status: Home / Self Care | Payer: Medicare Other | Source: Ambulatory Visit | Attending: Internal Medicine | Admitting: Internal Medicine

## 2013-01-26 ENCOUNTER — Encounter: Payer: Self-pay | Admitting: Internal Medicine

## 2013-01-26 ENCOUNTER — Ambulatory Visit (INDEPENDENT_AMBULATORY_CARE_PROVIDER_SITE_OTHER): Payer: Medicare Other | Admitting: Internal Medicine

## 2013-01-26 VITALS — BP 108/62 | HR 57 | Temp 97.2°F | Wt 199.8 lb

## 2013-01-26 DIAGNOSIS — R05 Cough: Secondary | ICD-10-CM

## 2013-01-26 DIAGNOSIS — R059 Cough, unspecified: Secondary | ICD-10-CM

## 2013-01-26 DIAGNOSIS — J438 Other emphysema: Secondary | ICD-10-CM

## 2013-01-26 MED ORDER — AZITHROMYCIN 250 MG PO TABS: ORAL_TABLET | ORAL | Status: AC

## 2013-01-26 NOTE — Progress Notes (Signed)
Subjective:    Patient ID: Jack [name], male    DOB: 11-08-1928, 79 y.o.   MRN: [id number]  HPI Mr. Hellmer presents with a two week h/o cough productive of copious amounts sputums that is white without blood or bitter taste. He is not having any increase in SOB and is fact better with Advair 500/50. He has had no fever, chills or significant weakness.  PMH, FamHx and SocHx reviewed for any changes and relevance.  Current Outpatient Prescriptions on File Prior to Visit  Medication Sig Dispense Refill  . albuterol (PROVENTIL HFA;VENTOLIN HFA) 108 (90 BASE) MCG/ACT inhaler Inhale 2 puffs into the lungs every 6 (six) hours as needed. For shortness of breath      . alfuzosin (UROXATRAL) 10 MG 24 hr tablet Take 10 mg by mouth every morning.      Marland Kitchen aspirin 81 MG tablet Take 81 mg by mouth daily.        Marland Kitchen BESIVANCE 0.6 % SUSP       . celecoxib (CELEBREX) 100 MG capsule Take 1 capsule (100 mg total) by mouth 2 (two) times daily.  60 capsule  0  . celecoxib (CELEBREX) 100 MG capsule take 1 capsule by mouth twice a day  60 capsule  11  . ciprofloxacin (CIPRO) 500 MG tablet       . diphenoxylate-atropine (LOMOTIL) 2.5-0.025 MG per tablet Take 1 tablet by mouth 4 (four) times daily as needed. For loose stool      . dorzolamide (TRUSOPT) 2 % ophthalmic solution Place 1 drop into both eyes 3 (three) times daily. NEW PRESCRIPTION, PT WILL NEED FILLED FOR ADMISSION      . fexofenadine (ALLEGRA) 180 MG tablet Take 180 mg by mouth daily as needed. For allergies      . Fluticasone-Salmeterol (ADVAIR DISKUS) 500-50 MCG/DOSE AEPB Inhale 1 puff into the lungs 2 (two) times daily.  60 each  11  . Ginkgo Biloba 40 MG TABS Take by mouth 2 (two) times daily as needed. Leg  cramps      . hyoscyamine (ANASPAZ) 0.125 MG TBDP dissolve 1 tablet under the tongue every 2 hours if needed for bloating gas and CRAMPS  30 tablet  2  . KLOR-CON M20 20 MEQ tablet take 1 tablet by mouth once daily  30 each  5  . magnesium oxide  (MAG-OX) 400 (241.3 MG) MG tablet take 1 tablet by mouth once daily  30 tablet  6  . metoprolol succinate (TOPROL-XL) 50 MG 24 hr tablet Take 75 mg by mouth every morning. Take with or immediately following a meal.      . metoprolol succinate (TOPROL-XL) 50 MG 24 hr tablet take 1 and 1/2 tablets by mouth once daily  45 tablet  5  . Multiple Vitamins-Minerals (PRESERVISION AREDS 2 PO) Take 1 tablet by mouth 2 (two) times daily.      Marland Kitchen NEXIUM 40 MG capsule take 1 capsule by mouth once daily BEFORE BREAKFAST  30 each  5  . NEXIUM 40 MG capsule take 1 capsule by mouth once daily BEFORE BREAKFAST  30 capsule  5  . oxybutynin (DITROPAN) 5 MG tablet       . oxyCODONE-acetaminophen (ROXICET) 5-325 MG per tablet Take 1-2 tablets by mouth every 6 (six) hours as needed for pain.  75 tablet  0  . potassium chloride SA (K-DUR,KLOR-CON) 20 MEQ tablet Take 1 tablet (20 mEq total) by mouth every morning.  30 tablet  1  .  PROAIR HFA 108 (90 BASE) MCG/ACT inhaler USE AS DIRECTED IF NEEDED  18 g  6  . quiNINE (QUALAQUIN) 324 MG capsule Take 324 mg by mouth 2 (two) times daily.      . simvastatin (ZOCOR) 20 MG tablet Take 1 tablet (20 mg total) by mouth at bedtime.  30 tablet  3  . temazepam (RESTORIL) 30 MG capsule take 1 capsule by mouth at bedtime  30 capsule  3  . triamcinolone cream (KENALOG) 0.1 % APPLY TO SKIN RASH TWICE A DAY AS NEEDED ( AVOID USE ON FACE)  80 g  3  . VIAGRA 100 MG tablet take 1 tablet by mouth if needed for ERECTILE DYSFUNCTION 1/2 OR 1 TABLET AS NEEDED  10 tablet  11   No current facility-administered medications on file prior to visit.      Review of Systems System review is negative for any constitutional, cardiac, pulmonary, GI or neuro symptoms or complaints other than as described in the HPI.      Objective:   Physical Exam T 98.2   108/62  HR 57  02 97% Wt Readings from Last 3 Encounters:  12/31/12 202 lb (91.627 kg)  12/08/12 200 lb (90.719 kg)  04/22/12 194 lb 8 oz  (88.225 kg)   Gen'l - WNWD white man HEENT- normal Cor- RRR Pulm - normal respirations, no rales or wheezing.        Assessment & Plan:  Productive cough - most likely viral that is getting better. But in the setting of chronic lung disease it is best to be prudent:   Plan Chest x-ray today; Z-pak. Continue all your other medications.  Addendum: CHEST - 2 VIEW  Comparison: 03/10/2012  Findings: Cardiomediastinal silhouette is stable. Bony thorax is  unremarkable. No acute infiltrate or pulmonary edema. Stable  calcified granuloma in the left upper lobe. No pleural effusion.  No diagnostic pneumothorax.  IMPRESSION:  No active disease. No significant change.

## 2013-01-26 NOTE — Patient Instructions (Addendum)
Productive cough - most likely viral that is getting better. But in the setting of chronic lung disease it is best to be prudent: Chest x-ray today; Z-pak. Continue all your other medications.  Cough, Adult  A cough is a reflex that helps clear your throat and airways. It can help heal the body or may be a reaction to an irritated airway. A cough may only last 2 or 3 weeks (acute) or may last more than 8 weeks (chronic).  CAUSES Acute cough:  Viral or bacterial infections. Chronic cough:  Infections.  Allergies.  Asthma.  Post-nasal drip.  Smoking.  Heartburn or acid reflux.  Some medicines.  Chronic lung problems (COPD).  Cancer. SYMPTOMS   Cough.  Fever.  Chest pain.  Increased breathing rate.  High-pitched whistling sound when breathing (wheezing).  Colored mucus that you cough up (sputum). TREATMENT   A bacterial cough may be treated with antibiotic medicine.  A viral cough must run its course and will not respond to antibiotics.  Your caregiver may recommend other treatments if you have a chronic cough. HOME CARE INSTRUCTIONS   Only take over-the-counter or prescription medicines for pain, discomfort, or fever as directed by your caregiver. Use cough suppressants only as directed by your caregiver.  Use a cold steam vaporizer or humidifier in your bedroom or home to help loosen secretions.  Sleep in a semi-upright position if your cough is worse at night.  Rest as needed.  Stop smoking if you smoke. SEEK IMMEDIATE MEDICAL CARE IF:   You have pus in your sputum.  Your cough starts to worsen.  You cannot control your cough with suppressants and are losing sleep.  You begin coughing up blood.  You have difficulty breathing.  You develop pain which is getting worse or is uncontrolled with medicine.  You have a fever. MAKE SURE YOU:   Understand these instructions.  Will watch your condition.  Will get help right away if you are not doing  well or get worse. Document Released: 12/07/2010 Document Revised: 09/02/2011 Document Reviewed: 12/07/2010 [facility] Patient Information 2014 [place], [place]. Cough, Adult  A cough is a reflex that helps clear your throat and airways. It can help heal the body or may be a reaction to an irritated airway. A cough may only last 2 or 3 weeks (acute) or may last more than 8 weeks (chronic).  CAUSES Acute cough:  Viral or bacterial infections. Chronic cough:  Infections.  Allergies.  Asthma.  Post-nasal drip.  Smoking.  Heartburn or acid reflux.  Some medicines.  Chronic lung problems (COPD).  Cancer. SYMPTOMS   Cough.  Fever.  Chest pain.  Increased breathing rate.  High-pitched whistling sound when breathing (wheezing).  Colored mucus that you cough up (sputum). TREATMENT   A bacterial cough may be treated with antibiotic medicine.  A viral cough must run its course and will not respond to antibiotics.  Your caregiver may recommend other treatments if you have a chronic cough. HOME CARE INSTRUCTIONS   Only take over-the-counter or prescription medicines for pain, discomfort, or fever as directed by your caregiver. Use cough suppressants only as directed by your caregiver.  Use a cold steam vaporizer or humidifier in your bedroom or home to help loosen secretions.  Sleep in a semi-upright position if your cough is worse at night.  Rest as needed.  Stop smoking if you smoke. SEEK IMMEDIATE MEDICAL CARE IF:   You have pus in your sputum.  Your cough starts to  worsen.  You cannot control your cough with suppressants and are losing sleep.  You begin coughing up blood.  You have difficulty breathing.  You develop pain which is getting worse or is uncontrolled with medicine.  You have a fever. MAKE SURE YOU:   Understand these instructions.  Will watch your condition.  Will get help right away if you are not doing well or get worse. Document  Released: 12/07/2010 Document Revised: 09/02/2011 Document Reviewed: 12/07/2010 [employer] Patient Information 2014 [place], [place].

## 2013-01-31 ENCOUNTER — Other Ambulatory Visit: Payer: Self-pay | Admitting: Internal Medicine

## 2013-02-01 NOTE — Telephone Encounter (Signed)
Okay to refill Lomotil?? Then I can send the other scripts.

## 2013-02-01 NOTE — Telephone Encounter (Signed)
Lomotil called to pharmacy  

## 2013-02-02 ENCOUNTER — Ambulatory Visit (INDEPENDENT_AMBULATORY_CARE_PROVIDER_SITE_OTHER): Payer: Medicare Other | Admitting: Internal Medicine

## 2013-02-02 DIAGNOSIS — J438 Other emphysema: Secondary | ICD-10-CM

## 2013-02-02 LAB — PULMONARY FUNCTION TEST

## 2013-02-02 NOTE — Progress Notes (Signed)
PFT done today. 

## 2013-02-05 ENCOUNTER — Encounter (INDEPENDENT_AMBULATORY_CARE_PROVIDER_SITE_OTHER): Payer: Medicare Other | Admitting: Ophthalmology

## 2013-02-05 DIAGNOSIS — H35039 Hypertensive retinopathy, unspecified eye: Secondary | ICD-10-CM

## 2013-02-05 DIAGNOSIS — I10 Essential (primary) hypertension: Secondary | ICD-10-CM

## 2013-02-05 DIAGNOSIS — H353290 Exudative age-related macular degeneration, unspecified eye, stage unspecified: Secondary | ICD-10-CM

## 2013-02-05 DIAGNOSIS — H43819 Vitreous degeneration, unspecified eye: Secondary | ICD-10-CM

## 2013-02-05 DIAGNOSIS — H3530 Unspecified macular degeneration: Secondary | ICD-10-CM

## 2013-02-14 ENCOUNTER — Encounter: Payer: Self-pay | Admitting: Internal Medicine

## 2013-02-28 ENCOUNTER — Other Ambulatory Visit: Payer: Self-pay | Admitting: Internal Medicine

## 2013-03-04 ENCOUNTER — Other Ambulatory Visit: Payer: Self-pay | Admitting: Orthopedic Surgery

## 2013-03-08 ENCOUNTER — Encounter (HOSPITAL_COMMUNITY): Payer: Self-pay | Admitting: Pharmacy Technician

## 2013-03-12 ENCOUNTER — Encounter (INDEPENDENT_AMBULATORY_CARE_PROVIDER_SITE_OTHER): Payer: Medicare Other | Admitting: Ophthalmology

## 2013-03-12 DIAGNOSIS — H353290 Exudative age-related macular degeneration, unspecified eye, stage unspecified: Secondary | ICD-10-CM

## 2013-03-12 DIAGNOSIS — H35039 Hypertensive retinopathy, unspecified eye: Secondary | ICD-10-CM

## 2013-03-12 DIAGNOSIS — I10 Essential (primary) hypertension: Secondary | ICD-10-CM

## 2013-03-12 DIAGNOSIS — H43819 Vitreous degeneration, unspecified eye: Secondary | ICD-10-CM

## 2013-03-12 DIAGNOSIS — H3530 Unspecified macular degeneration: Secondary | ICD-10-CM

## 2013-03-15 ENCOUNTER — Other Ambulatory Visit (HOSPITAL_COMMUNITY): Payer: Medicare Other

## 2013-03-17 ENCOUNTER — Encounter (HOSPITAL_COMMUNITY): Payer: Self-pay

## 2013-03-17 ENCOUNTER — Encounter (HOSPITAL_COMMUNITY)
Admission: RE | Admit: 2013-03-17 | Discharge: 2013-03-17 | Disposition: A | Discharge status: Home / Self Care | Payer: Medicare Other | Source: Ambulatory Visit | Attending: Orthopedic Surgery | Admitting: Orthopedic Surgery

## 2013-03-17 DIAGNOSIS — Z01818 Encounter for other preprocedural examination: Secondary | ICD-10-CM | POA: Insufficient documentation

## 2013-03-17 DIAGNOSIS — Z01810 Encounter for preprocedural cardiovascular examination: Secondary | ICD-10-CM | POA: Insufficient documentation

## 2013-03-17 DIAGNOSIS — Z01812 Encounter for preprocedural laboratory examination: Secondary | ICD-10-CM | POA: Insufficient documentation

## 2013-03-17 HISTORY — DX: Essential (primary) hypertension: I10

## 2013-03-17 HISTORY — DX: Shortness of breath: R06.02

## 2013-03-17 LAB — HEPATIC FUNCTION PANEL
ALT: 16 U/L (ref 0–53)
AST: 27 U/L (ref 0–37)
Albumin: 3.6 g/dL (ref 3.5–5.2)
Alkaline Phosphatase: 69 U/L (ref 39–117)
Total Bilirubin: 0.4 mg/dL (ref 0.3–1.2)

## 2013-03-17 LAB — BASIC METABOLIC PANEL
BUN: 15 mg/dL (ref 6–23)
GFR calc non Af Amer: 79 mL/min — ABNORMAL LOW (ref >90)
Glucose, Bld: 127 mg/dL — ABNORMAL HIGH (ref 70–99)
Potassium: 4.6 mEq/L (ref 3.5–5.1)

## 2013-03-17 LAB — CBC
HCT: 38.3 % — ABNORMAL LOW (ref 39.0–52.0)
Hemoglobin: 12.5 g/dL — ABNORMAL LOW (ref 13.0–17.0)
MCH: 30.1 pg (ref 26.0–34.0)
MCHC: 32.6 g/dL (ref 30.0–36.0)
MCV: 92.3 fL (ref 78.0–100.0)

## 2013-03-17 NOTE — Progress Notes (Signed)
PCP is Dr. Debby Bud  Denies having been to a Cardiologist  For about 2 to 3 years  Chest Xray in Aug  2014 in epic  Denies recent EKG, Stress test, or ever having a card cath.  Echo noted in epic from July 2013.  PFT's done in Aug 2014 in epic

## 2013-03-17 NOTE — Progress Notes (Signed)
Anesthesia Chart Review: Patient is an 79 year old male scheduled for right TKA on 03/22/13 by Dr. Dion Saucier. Case is posted for spinal anesthesia. I was going to speak with patient during his PAT visit about his previous experience with anesthesia, but after labs and EKG he left before talking with me. He apparently had difficulty awakening (prolonged emergence) and breathing problems after rotator cuff repair. He has since undergone left TKA 03/17/12 under spinal anesthesia.    History includes former smoker, GERD, ETOH abuse with cirrhosis (still with occasional wine use), IBS, HLD, Non-Hodgkin's lymphoma '07, HTN, macular degeneration, glaucoma, emphysema, hearing loss, pseudogout, osteoarthritis, prostatitis, L3-5 diskectomy '06, left rotator cuff repair, inguinal hernia repair '79, let TKA '13. PCP is Dr. Debby Bud.  He had a preoperative cardiology evaluation ijn July 2013 (prior to his left TKA) by Dr. Eden Emms due to finding of new right BBB with dyspnea and palpitations in the setting ETOH use.  Echo was done (see below).  EKG on 03/17/13 showed NSR, borderline LAD, right BBB. It appears stable when compared to EKG from 01/08/12.  Echo on 01/22/12 showed: - Left ventricle: The cavity size was normal. Wall thickness was normal. Systolic function was normal. The estimated ejection fraction was in the range of 55% to 60%. Wall motion was normal; there were no regional wall motion abnormalities. Doppler parameters are consistent with abnormal left ventricular relaxation (grade 1 diastolic dysfunction). - Mitral valve: Mild regurgitation. - Left atrium: The atrium was mildly dilated. - Right atrium: The atrium was mildly to moderately dilated. - Pulmonary arteries: Systolic pressure was mildly increased. PA peak pressure: 40mm Hg (S). - Tricuspid valve: Mild regurgitation.  CXR on 01/26/14 showed no active disease. Stable calcified granuloma in the LUL.  PFTs on 02/02/13 showed FVC 2.42 (65%), FEV1 1.38 (52%),  FEV1/FVC 57 (80%), DLCOunc 20.85 (68%).  Preoperative labs noted.  (HFP added due to history of cirrhosis.)  PT/PTT were not done on the day of surgery, so they will need to be done on the day of surgery.  He was able to undergo left TKA last year under spinal anesthesia, so would anticipate that as long as coags are acceptable that this could be attempted as well for his right TKA.  Further evaluation by his assigned anesthesiologist on the day of surgery to determine the definitve anesthesia plan.  Velna Ochs [facility]/Anesthesiology Phone [phone] 03/17/2013 2:29 PM

## 2013-03-17 NOTE — Progress Notes (Signed)
Pt was informed to wait to see Revonda Standard before he left, but left before she could see him.

## 2013-03-17 NOTE — Pre-Procedure Instructions (Signed)
Jack [name]  03/17/2013   Your procedure is scheduled on:  03/22/13  Report to [facility] at *1100 AM.  Call this number if you have problems the morning of surgery: [phone]   Remember:   Do not eat food or drink liquids after midnight.   Take these medicines the morning of surgery with A SIP OF WATER:inhalers, uroxatrol, ditropan, eue drops, nexium, metoprolol,pain med if needed         STOP aspirin, celebrex, ginkgo now   Do not wear jewelry, make-up or nail polish.  Do not wear lotions, powders, or perfumes. You may wear deodorant.  Do not shave 48 hours prior to surgery. Men may shave face and neck.  Do not bring valuables to the hospital.  [facility] is not responsible                  for any belongings or valuables.               Contacts, dentures or bridgework may not be worn into surgery.  Leave suitcase in the car. After surgery it may be brought to your room.  For patients admitted to the hospital, discharge time is determined by your                treatment team.               Patients discharged the day of surgery will not be allowed to drive  home.  Name and phone number of your driver:   Special Instructions: Incentive Spirometry - Practice and bring it with you on the day of surgery. Shower using CHG 2 nights before surgery and the night before surgery.  If you shower the day of surgery use CHG.  Use special wash - you have one bottle of CHG for all showers.  You should use approximately 1/3 of the bottle for each shower.   Please read over the following fact sheets that you were given: Pain Booklet, Coughing and Deep Breathing, Blood Transfusion Information, MRSA Information and Surgical Site Infection Prevention

## 2013-03-17 NOTE — Progress Notes (Signed)
03/17/13 1051  OBSTRUCTIVE SLEEP APNEA  Have you ever been diagnosed with sleep apnea through a sleep study? No  Do you snore loudly (loud enough to be heard through closed doors)?  1  Do you often feel tired, fatigued, or sleepy during the daytime? 1  Has anyone observed you stop breathing during your sleep? 0  Do you have, or are you being treated for high blood pressure? 1  BMI more than 35 kg/m2? 0  Age over 77 years old? 1  Neck circumference greater than 40 cm/18 inches? 1  Obstructive Sleep Apnea Score 5  Score 4 or greater  Results sent to PCP

## 2013-03-17 NOTE — Progress Notes (Signed)
Pt voices concerns about anesthesia and would like to speak with someone about this Revonda Standard called and will speak with Mr Hayashi.

## 2013-03-21 MED ORDER — CLINDAMYCIN PHOSPHATE 900 MG/50ML IV SOLN
900.0000 mg | INTRAVENOUS | Status: AC
  Administered 2013-03-22: 900 mg via INTRAVENOUS
  Filled 2013-03-21: qty 50

## 2013-03-22 ENCOUNTER — Inpatient Hospital Stay (HOSPITAL_COMMUNITY)
Admission: RE | Admit: 2013-03-22 | Discharge: 2013-03-26 | DRG: 470 | Disposition: A | Discharge status: Skilled Nursing Facility | Payer: Medicare Other | Source: Ambulatory Visit | Attending: Orthopedic Surgery | Admitting: Orthopedic Surgery

## 2013-03-22 ENCOUNTER — Encounter (HOSPITAL_COMMUNITY): Payer: Self-pay | Admitting: Anesthesiology

## 2013-03-22 ENCOUNTER — Inpatient Hospital Stay (HOSPITAL_COMMUNITY): Payer: Medicare Other | Admitting: *Deleted

## 2013-03-22 ENCOUNTER — Encounter (HOSPITAL_COMMUNITY)
Admission: RE | Disposition: A | Discharge status: Skilled Nursing Facility | Payer: Self-pay | Source: Ambulatory Visit | Attending: Orthopedic Surgery

## 2013-03-22 ENCOUNTER — Inpatient Hospital Stay (HOSPITAL_COMMUNITY): Payer: Medicare Other

## 2013-03-22 ENCOUNTER — Encounter (HOSPITAL_COMMUNITY): Payer: Self-pay | Admitting: Vascular Surgery

## 2013-03-22 DIAGNOSIS — K567 Ileus, unspecified: Secondary | ICD-10-CM | POA: Diagnosis not present

## 2013-03-22 DIAGNOSIS — M1711 Unilateral primary osteoarthritis, right knee: Secondary | ICD-10-CM | POA: Diagnosis present

## 2013-03-22 HISTORY — PX: TOTAL KNEE ARTHROPLASTY: SHX125

## 2013-03-22 LAB — CBC
HCT: 36.5 % — ABNORMAL LOW (ref 39.0–52.0)
MCHC: 33.2 g/dL (ref 30.0–36.0)
MCV: 91.9 fL (ref 78.0–100.0)
Platelets: 179 10*3/uL (ref 150–400)
RDW: 13.3 % (ref 11.5–15.5)

## 2013-03-22 LAB — CREATININE, SERUM: GFR calc non Af Amer: 83 mL/min — ABNORMAL LOW (ref >90)

## 2013-03-22 SURGERY — ARTHROPLASTY, KNEE, TOTAL
Anesthesia: Monitor Anesthesia Care | Site: Knee | Laterality: Right | Wound class: Clean

## 2013-03-22 MED ORDER — OXYCODONE HCL 5 MG PO TABS
5.0000 mg | ORAL_TABLET | ORAL | Status: DC | PRN
  Administered 2013-03-22 (×2): 5 mg via ORAL
  Administered 2013-03-22 – 2013-03-25 (×10): 10 mg via ORAL
  Filled 2013-03-22: qty 1
  Filled 2013-03-22 (×7): qty 2
  Filled 2013-03-22: qty 1
  Filled 2013-03-22 (×4): qty 2

## 2013-03-22 MED ORDER — LACTATED RINGERS IV SOLN
INTRAVENOUS | Status: DC | PRN
  Administered 2013-03-22 (×2): via INTRAVENOUS

## 2013-03-22 MED ORDER — POTASSIUM CHLORIDE IN NACL 20-0.45 MEQ/L-% IV SOLN
INTRAVENOUS | Status: DC
  Administered 2013-03-22 – 2013-03-23 (×2): via INTRAVENOUS
  Filled 2013-03-22 (×12): qty 1000

## 2013-03-22 MED ORDER — LORATADINE 10 MG PO TABS: 10.0000 mg | ORAL_TABLET | Freq: Every day | ORAL | Status: DC

## 2013-03-22 MED ORDER — ACETAMINOPHEN 325 MG PO TABS: 650.0000 mg | ORAL_TABLET | Freq: Four times a day (QID) | ORAL | Status: DC | PRN

## 2013-03-22 MED ORDER — OXYCODONE HCL 5 MG/5ML PO SOLN: 5.0000 mg | Freq: Once | ORAL | Status: DC | PRN

## 2013-03-22 MED ORDER — DOCUSATE SODIUM 100 MG PO CAPS
100.0000 mg | ORAL_CAPSULE | Freq: Two times a day (BID) | ORAL | Status: DC
  Administered 2013-03-22 – 2013-03-25 (×7): 100 mg via ORAL
  Filled 2013-03-22 (×8): qty 1

## 2013-03-22 MED ORDER — TEMAZEPAM 15 MG PO CAPS
30.0000 mg | ORAL_CAPSULE | Freq: Every day | ORAL | Status: DC
  Administered 2013-03-22 – 2013-03-25 (×4): 30 mg via ORAL
  Filled 2013-03-22 (×4): qty 2

## 2013-03-22 MED ORDER — ONDANSETRON HCL 4 MG/2ML IJ SOLN
INTRAMUSCULAR | Status: DC | PRN
  Administered 2013-03-22: 4 mg via INTRAVENOUS

## 2013-03-22 MED ORDER — POLYETHYLENE GLYCOL 3350 17 G PO PACK: 17.0000 g | PACK | Freq: Every day | ORAL | Status: DC | PRN

## 2013-03-22 MED ORDER — CELECOXIB 100 MG PO CAPS
100.0000 mg | ORAL_CAPSULE | Freq: Two times a day (BID) | ORAL | Status: DC
  Administered 2013-03-23 – 2013-03-26 (×7): 100 mg via ORAL
  Filled 2013-03-22 (×12): qty 1

## 2013-03-22 MED ORDER — BISACODYL 10 MG RE SUPP
10.0000 mg | Freq: Every day | RECTAL | Status: DC | PRN
  Administered 2013-03-26: 10 mg via RECTAL
  Filled 2013-03-22: qty 1

## 2013-03-22 MED ORDER — PANTOPRAZOLE SODIUM 40 MG PO TBEC
80.0000 mg | DELAYED_RELEASE_TABLET | Freq: Every day | ORAL | Status: DC
  Administered 2013-03-23 – 2013-03-26 (×4): 80 mg via ORAL
  Filled 2013-03-22 (×4): qty 2

## 2013-03-22 MED ORDER — ALUM & MAG HYDROXIDE-SIMETH 200-200-20 MG/5ML PO SUSP: 30.0000 mL | ORAL | Status: DC | PRN

## 2013-03-22 MED ORDER — HYDROMORPHONE HCL PF 1 MG/ML IJ SOLN
0.5000 mg | INTRAMUSCULAR | Status: DC | PRN
  Administered 2013-03-22 – 2013-03-23 (×4): 0.5 mg via INTRAVENOUS
  Filled 2013-03-22 (×5): qty 1

## 2013-03-22 MED ORDER — MAGNESIUM OXIDE 400 (241.3 MG) MG PO TABS
400.0000 mg | ORAL_TABLET | Freq: Every day | ORAL | Status: DC
  Administered 2013-03-23 – 2013-03-26 (×4): 400 mg via ORAL
  Filled 2013-03-22 (×4): qty 1

## 2013-03-22 MED ORDER — ALBUTEROL SULFATE HFA 108 (90 BASE) MCG/ACT IN AERS
2.0000 | INHALATION_SPRAY | Freq: Four times a day (QID) | RESPIRATORY_TRACT | Status: DC | PRN
  Administered 2013-03-24 – 2013-03-26 (×3): 2 via RESPIRATORY_TRACT
  Filled 2013-03-22 (×4): qty 6.7

## 2013-03-22 MED ORDER — LIDOCAINE HCL (CARDIAC) 20 MG/ML IV SOLN
INTRAVENOUS | Status: DC | PRN
  Administered 2013-03-22: 40 mg via INTRAVENOUS

## 2013-03-22 MED ORDER — PHENOL 1.4 % MT LIQD: 1.0000 | OROMUCOSAL | Status: DC | PRN

## 2013-03-22 MED ORDER — MIDAZOLAM HCL 5 MG/5ML IJ SOLN
INTRAMUSCULAR | Status: DC | PRN
  Administered 2013-03-22 (×2): 1 mg via INTRAVENOUS

## 2013-03-22 MED ORDER — GATIFLOXACIN 0.5 % OP SOLN
1.0000 [drp] | Freq: Four times a day (QID) | OPHTHALMIC | Status: DC
  Filled 2013-03-22: qty 2.5

## 2013-03-22 MED ORDER — 0.9 % SODIUM CHLORIDE (POUR BTL) OPTIME
TOPICAL | Status: DC | PRN
  Administered 2013-03-22: 1000 mL

## 2013-03-22 MED ORDER — FLEET ENEMA 7-19 GM/118ML RE ENEM: 1.0000 | ENEMA | Freq: Once | RECTAL | Status: AC | PRN

## 2013-03-22 MED ORDER — HYDROMORPHONE HCL PF 1 MG/ML IJ SOLN: 0.2500 mg | INTRAMUSCULAR | Status: DC | PRN

## 2013-03-22 MED ORDER — KETOROLAC TROMETHAMINE 15 MG/ML IJ SOLN
7.5000 mg | Freq: Four times a day (QID) | INTRAMUSCULAR | Status: AC
  Administered 2013-03-22 – 2013-03-23 (×3): 7.5 mg via INTRAVENOUS
  Filled 2013-03-22 (×4): qty 1

## 2013-03-22 MED ORDER — OXYCODONE HCL 5 MG PO TABS: 5.0000 mg | ORAL_TABLET | Freq: Once | ORAL | Status: DC | PRN

## 2013-03-22 MED ORDER — ACETAMINOPHEN 650 MG RE SUPP: 650.0000 mg | Freq: Four times a day (QID) | RECTAL | Status: DC | PRN

## 2013-03-22 MED ORDER — ALBUTEROL SULFATE HFA 108 (90 BASE) MCG/ACT IN AERS: 2.0000 | INHALATION_SPRAY | Freq: Four times a day (QID) | RESPIRATORY_TRACT | Status: DC | PRN

## 2013-03-22 MED ORDER — ALFUZOSIN HCL ER 10 MG PO TB24
10.0000 mg | ORAL_TABLET | Freq: Every morning | ORAL | Status: DC
  Administered 2013-03-23 – 2013-03-26 (×4): 10 mg via ORAL
  Filled 2013-03-22 (×4): qty 1

## 2013-03-22 MED ORDER — METOPROLOL SUCCINATE ER 50 MG PO TB24
75.0000 mg | ORAL_TABLET | Freq: Every morning | ORAL | Status: DC
  Administered 2013-03-23 – 2013-03-26 (×4): 75 mg via ORAL
  Filled 2013-03-22 (×4): qty 1

## 2013-03-22 MED ORDER — QUININE SULFATE 324 MG PO CAPS
324.0000 mg | ORAL_CAPSULE | Freq: Two times a day (BID) | ORAL | Status: DC
  Administered 2013-03-22 – 2013-03-26 (×8): 324 mg via ORAL
  Filled 2013-03-22 (×9): qty 1

## 2013-03-22 MED ORDER — SODIUM CHLORIDE 0.9 % IR SOLN
Status: DC | PRN
  Administered 2013-03-22: 3000 mL

## 2013-03-22 MED ORDER — DEXAMETHASONE SODIUM PHOSPHATE 4 MG/ML IJ SOLN
INTRAMUSCULAR | Status: DC | PRN
  Administered 2013-03-22: 4 mg via INTRAVENOUS

## 2013-03-22 MED ORDER — SIMVASTATIN 20 MG PO TABS
20.0000 mg | ORAL_TABLET | Freq: Every day | ORAL | Status: DC
  Administered 2013-03-22 – 2013-03-25 (×4): 20 mg via ORAL
  Filled 2013-03-22 (×5): qty 1

## 2013-03-22 MED ORDER — BUPIVACAINE IN DEXTROSE 0.75-8.25 % IT SOLN
INTRATHECAL | Status: DC | PRN
  Administered 2013-03-22: 1.8 mL via INTRATHECAL

## 2013-03-22 MED ORDER — ASPIRIN EC 81 MG PO TBEC
81.0000 mg | DELAYED_RELEASE_TABLET | Freq: Every day | ORAL | Status: DC
  Administered 2013-03-23 – 2013-03-26 (×4): 81 mg via ORAL
  Filled 2013-03-22 (×4): qty 1

## 2013-03-22 MED ORDER — PROPOFOL INFUSION 10 MG/ML OPTIME
INTRAVENOUS | Status: DC | PRN
  Administered 2013-03-22: 50 ug/kg/min via INTRAVENOUS

## 2013-03-22 MED ORDER — MOMETASONE FURO-FORMOTEROL FUM 200-5 MCG/ACT IN AERO
2.0000 | INHALATION_SPRAY | Freq: Two times a day (BID) | RESPIRATORY_TRACT | Status: DC
  Administered 2013-03-22 – 2013-03-23 (×2): 2 via RESPIRATORY_TRACT
  Filled 2013-03-22: qty 8.8

## 2013-03-22 MED ORDER — METOCLOPRAMIDE HCL 5 MG/ML IJ SOLN
5.0000 mg | Freq: Three times a day (TID) | INTRAMUSCULAR | Status: DC | PRN
  Administered 2013-03-23 – 2013-03-25 (×3): 10 mg via INTRAVENOUS
  Filled 2013-03-22 (×5): qty 2

## 2013-03-22 MED ORDER — TRIAMCINOLONE ACETONIDE 0.1 % EX CREA
TOPICAL_CREAM | Freq: Two times a day (BID) | CUTANEOUS | Status: DC
  Administered 2013-03-25 – 2013-03-26 (×2): via TOPICAL
  Filled 2013-03-22: qty 15

## 2013-03-22 MED ORDER — CLINDAMYCIN PHOSPHATE 600 MG/50ML IV SOLN
600.0000 mg | Freq: Four times a day (QID) | INTRAVENOUS | Status: AC
  Administered 2013-03-22 – 2013-03-23 (×2): 600 mg via INTRAVENOUS
  Filled 2013-03-22 (×2): qty 50

## 2013-03-22 MED ORDER — ENOXAPARIN SODIUM 30 MG/0.3ML ~~LOC~~ SOLN: 30.0000 mg | Freq: Two times a day (BID) | SUBCUTANEOUS | Status: DC

## 2013-03-22 MED ORDER — METOCLOPRAMIDE HCL 5 MG/ML IJ SOLN: 10.0000 mg | Freq: Once | INTRAMUSCULAR | Status: DC | PRN

## 2013-03-22 MED ORDER — ENOXAPARIN SODIUM 30 MG/0.3ML ~~LOC~~ SOLN
30.0000 mg | Freq: Two times a day (BID) | SUBCUTANEOUS | Status: DC
  Administered 2013-03-23 – 2013-03-26 (×7): 30 mg via SUBCUTANEOUS
  Filled 2013-03-22 (×10): qty 0.3

## 2013-03-22 MED ORDER — ZOLPIDEM TARTRATE 5 MG PO TABS: 5.0000 mg | ORAL_TABLET | Freq: Every evening | ORAL | Status: DC | PRN

## 2013-03-22 MED ORDER — METHOCARBAMOL 100 MG/ML IJ SOLN
500.0000 mg | Freq: Four times a day (QID) | INTRAVENOUS | Status: DC | PRN
  Filled 2013-03-22: qty 5

## 2013-03-22 MED ORDER — SENNA 8.6 MG PO TABS
1.0000 | ORAL_TABLET | Freq: Two times a day (BID) | ORAL | Status: DC
  Administered 2013-03-22 – 2013-03-25 (×7): 8.6 mg via ORAL
  Filled 2013-03-22 (×9): qty 1

## 2013-03-22 MED ORDER — SENNA-DOCUSATE SODIUM 8.6-50 MG PO TABS: 2.0000 | ORAL_TABLET | Freq: Every day | ORAL | Status: DC

## 2013-03-22 MED ORDER — MAGNESIUM OXIDE 400 MG PO TABS: 400.0000 mg | ORAL_TABLET | Freq: Every day | ORAL | Status: DC

## 2013-03-22 MED ORDER — HYOSCYAMINE SULFATE 0.125 MG SL SUBL
0.1250 mg | SUBLINGUAL_TABLET | SUBLINGUAL | Status: DC | PRN
  Filled 2013-03-22: qty 1

## 2013-03-22 MED ORDER — MENTHOL 3 MG MT LOZG: 1.0000 | LOZENGE | OROMUCOSAL | Status: DC | PRN

## 2013-03-22 MED ORDER — POTASSIUM CHLORIDE CRYS ER 20 MEQ PO TBCR
20.0000 meq | EXTENDED_RELEASE_TABLET | Freq: Every day | ORAL | Status: DC
  Administered 2013-03-23 – 2013-03-26 (×4): 20 meq via ORAL
  Filled 2013-03-22 (×4): qty 1

## 2013-03-22 MED ORDER — METHOCARBAMOL 500 MG PO TABS
500.0000 mg | ORAL_TABLET | Freq: Four times a day (QID) | ORAL | Status: DC | PRN
  Administered 2013-03-22 – 2013-03-23 (×4): 500 mg via ORAL
  Filled 2013-03-22 (×4): qty 1

## 2013-03-22 MED ORDER — DIPHENOXYLATE-ATROPINE 2.5-0.025 MG PO TABS: 1.0000 | ORAL_TABLET | Freq: Four times a day (QID) | ORAL | Status: DC | PRN

## 2013-03-22 MED ORDER — LACTATED RINGERS IV SOLN
INTRAVENOUS | Status: DC
  Administered 2013-03-22: 12:00:00 via INTRAVENOUS

## 2013-03-22 MED ORDER — OXYCODONE-ACETAMINOPHEN 10-325 MG PO TABS: 1.0000 | ORAL_TABLET | Freq: Four times a day (QID) | ORAL | Status: DC | PRN

## 2013-03-22 MED ORDER — DORZOLAMIDE HCL 2 % OP SOLN
1.0000 [drp] | Freq: Three times a day (TID) | OPHTHALMIC | Status: DC
  Filled 2013-03-22: qty 10

## 2013-03-22 MED ORDER — ONDANSETRON HCL 4 MG/2ML IJ SOLN
4.0000 mg | Freq: Four times a day (QID) | INTRAMUSCULAR | Status: DC | PRN
  Filled 2013-03-22: qty 2

## 2013-03-22 MED ORDER — DIPHENHYDRAMINE HCL 12.5 MG/5ML PO ELIX: 12.5000 mg | ORAL_SOLUTION | ORAL | Status: DC | PRN

## 2013-03-22 MED ORDER — METOCLOPRAMIDE HCL 10 MG PO TABS
5.0000 mg | ORAL_TABLET | Freq: Three times a day (TID) | ORAL | Status: DC | PRN
  Filled 2013-03-22: qty 1

## 2013-03-22 MED ORDER — OXYBUTYNIN CHLORIDE ER 5 MG PO TB24
5.0000 mg | ORAL_TABLET | Freq: Every day | ORAL | Status: DC
  Administered 2013-03-23 – 2013-03-26 (×4): 5 mg via ORAL
  Filled 2013-03-22 (×4): qty 1

## 2013-03-22 MED ORDER — ONDANSETRON HCL 4 MG PO TABS
4.0000 mg | ORAL_TABLET | Freq: Four times a day (QID) | ORAL | Status: DC | PRN
  Administered 2013-03-23: 4 mg via ORAL

## 2013-03-22 SURGICAL SUPPLY — 65 items
APL SKNCLS STERI-STRIP NONHPOA (GAUZE/BANDAGES/DRESSINGS) ×1
BANDAGE ELASTIC 4 VELCRO ST LF (GAUZE/BANDAGES/DRESSINGS) ×1 IMPLANT
BANDAGE ELASTIC 6 VELCRO ST LF (GAUZE/BANDAGES/DRESSINGS) ×4 IMPLANT
BANDAGE ESMARK 6X9 LF (GAUZE/BANDAGES/DRESSINGS) ×1 IMPLANT
BENZOIN TINCTURE PRP APPL 2/3 (GAUZE/BANDAGES/DRESSINGS) ×2 IMPLANT
BLADE SAG 18X100X1.27 (BLADE) ×2 IMPLANT
BLADE SAW RECIP 87.9 MT (BLADE) ×2 IMPLANT
BLADE SAW SGTL 13X75X1.27 (BLADE) ×2 IMPLANT
BLADE SURG 10 STRL SS (BLADE) ×4 IMPLANT
BNDG CMPR 9X6 STRL LF SNTH (GAUZE/BANDAGES/DRESSINGS) ×1
BNDG CMPR MD 5X2 ELC HKLP STRL (GAUZE/BANDAGES/DRESSINGS) ×1
BNDG ELASTIC 2 VLCR STRL LF (GAUZE/BANDAGES/DRESSINGS) ×1 IMPLANT
BNDG ESMARK 6X9 LF (GAUZE/BANDAGES/DRESSINGS) ×2
BOOTCOVER CLEANROOM LRG (PROTECTIVE WEAR) ×4 IMPLANT
BOWL SMART MIX CTS (DISPOSABLE) ×2 IMPLANT
CAPT RP KNEE ×1 IMPLANT
CEMENT HV SMART SET (Cement) ×4 IMPLANT
CLOTH BEACON ORANGE TIMEOUT ST (SAFETY) ×2 IMPLANT
CLSR STERI-STRIP ANTIMIC 1/2X4 (GAUZE/BANDAGES/DRESSINGS) ×1 IMPLANT
COVER SURGICAL LIGHT HANDLE (MISCELLANEOUS) ×2 IMPLANT
CUFF TOURNIQUET SINGLE 34IN LL (TOURNIQUET CUFF) IMPLANT
DRAPE EXTREMITY T 121X128X90 (DRAPE) ×2 IMPLANT
DRAPE PROXIMA HALF (DRAPES) ×2 IMPLANT
DRAPE U-SHAPE 47X51 STRL (DRAPES) ×2 IMPLANT
DRSG PAD ABDOMINAL 8X10 ST (GAUZE/BANDAGES/DRESSINGS) ×2 IMPLANT
DURAPREP 26ML APPLICATOR (WOUND CARE) ×2 IMPLANT
ELECT CAUTERY BLADE 6.4 (BLADE) ×2 IMPLANT
ELECT REM PT RETURN 9FT ADLT (ELECTROSURGICAL) ×2
ELECTRODE REM PT RTRN 9FT ADLT (ELECTROSURGICAL) ×1 IMPLANT
FACESHIELD LNG OPTICON STERILE (SAFETY) ×2 IMPLANT
GLOVE BIOGEL PI ORTHO PRO SZ8 (GLOVE) ×2
GLOVE ORTHO TXT STRL SZ7.5 (GLOVE) ×2 IMPLANT
GLOVE PI ORTHO PRO STRL SZ8 (GLOVE) ×2 IMPLANT
GLOVE SURG ORTHO 8.0 STRL STRW (GLOVE) ×2 IMPLANT
GOWN BRE IMP PREV XXLGXLNG (GOWN DISPOSABLE) ×2 IMPLANT
GOWN STRL REIN XL XLG (GOWN DISPOSABLE) ×2 IMPLANT
HANDPIECE INTERPULSE COAX TIP (DISPOSABLE) ×2
HOOD PEEL AWAY FACE SHEILD DIS (HOOD) ×6 IMPLANT
IMMOBILIZER KNEE 22 UNIV (SOFTGOODS) ×2 IMPLANT
KIT BASIN OR (CUSTOM PROCEDURE TRAY) ×2 IMPLANT
KIT ROOM TURNOVER OR (KITS) ×2 IMPLANT
MANIFOLD NEPTUNE II (INSTRUMENTS) ×2 IMPLANT
NS IRRIG 1000ML POUR BTL (IV SOLUTION) ×2 IMPLANT
PACK TOTAL JOINT (CUSTOM PROCEDURE TRAY) ×2 IMPLANT
PAD ARMBOARD 7.5X6 YLW CONV (MISCELLANEOUS) ×4 IMPLANT
PAD CAST 4YDX4 CTTN HI CHSV (CAST SUPPLIES) ×1 IMPLANT
PADDING CAST COTTON 4X4 STRL (CAST SUPPLIES) ×2
PADDING CAST COTTON 6X4 STRL (CAST SUPPLIES) ×2 IMPLANT
SET HNDPC FAN SPRY TIP SCT (DISPOSABLE) ×1 IMPLANT
SPONGE GAUZE 4X4 12PLY (GAUZE/BANDAGES/DRESSINGS) ×2 IMPLANT
STAPLER VISISTAT 35W (STAPLE) ×2 IMPLANT
STRIP CLOSURE SKIN 1/2X4 (GAUZE/BANDAGES/DRESSINGS) ×2 IMPLANT
SUCTION FRAZIER TIP 10 FR DISP (SUCTIONS) ×2 IMPLANT
SUT MNCRL AB 4-0 PS2 18 (SUTURE) IMPLANT
SUT VIC AB 0 CT1 27 (SUTURE) ×2
SUT VIC AB 0 CT1 27XBRD ANBCTR (SUTURE) ×1 IMPLANT
SUT VIC AB 2-0 CT1 27 (SUTURE) ×2
SUT VIC AB 2-0 CT1 TAPERPNT 27 (SUTURE) ×1 IMPLANT
SUT VIC AB 3-0 SH 8-18 (SUTURE) ×4 IMPLANT
SYR 30ML LL (SYRINGE) ×2 IMPLANT
TOWEL OR 17X24 6PK STRL BLUE (TOWEL DISPOSABLE) ×2 IMPLANT
TOWEL OR 17X26 10 PK STRL BLUE (TOWEL DISPOSABLE) ×2 IMPLANT
TRAY FOLEY BAG SILVER LF 16FR (CATHETERS) ×1 IMPLANT
TRAY FOLEY CATH 16FRSI W/METER (SET/KITS/TRAYS/PACK) IMPLANT
WATER STERILE IRR 1000ML POUR (IV SOLUTION) ×4 IMPLANT

## 2013-03-22 NOTE — Progress Notes (Signed)
Orthopedic Tech Progress Note Patient Details:  Jack [name] 08-Oct-1928 [id number]  CPM Right Knee CPM Right Knee: On Right Knee Flexion (Degrees): 60 Right Knee Extension (Degrees): 0   [name], [name] 03/22/2013, 4:40 PM

## 2013-03-22 NOTE — Anesthesia Preprocedure Evaluation (Signed)
Anesthesia Evaluation  Patient identified by MRN, date of birth, ID band Patient awake    Reviewed: Allergy & Precautions, H&P , NPO status , Patient's Chart, lab work & pertinent test results, reviewed documented beta blocker date and time   History of Anesthesia Complications (+) PROLONGED EMERGENCE  Airway Mallampati: II TM Distance: >3 FB Neck ROM: full    Dental   Pulmonary shortness of breath and with exertion, COPD COPD inhaler,  breath sounds clear to auscultation        Cardiovascular hypertension, On Medications and On Home Beta Blockers + dysrhythmias Rhythm:regular     Neuro/Psych  Neuromuscular disease negative psych ROS   GI/Hepatic GERD-  Controlled,(+) Cirrhosis -    substance abuse  alcohol use,   Endo/Other  negative endocrine ROS  Renal/GU negative Renal ROS  negative genitourinary   Musculoskeletal   Abdominal   Peds  Hematology negative hematology ROS (+)   Anesthesia Other Findings See surgeon's H&P   Reproductive/Obstetrics negative OB ROS                           Anesthesia Physical Anesthesia Plan  ASA: III  Anesthesia Plan: Spinal   Post-op Pain Management:    Induction:   Airway Management Planned: Simple Face Mask  Additional Equipment:   Intra-op Plan:   Post-operative Plan:   Informed Consent: I have reviewed the patients History and Physical, chart, labs and discussed the procedure including the risks, benefits and alternatives for the proposed anesthesia with the patient or authorized representative who has indicated his/her understanding and acceptance.   Dental Advisory Given  Plan Discussed with: CRNA and Surgeon  Anesthesia Plan Comments:         Anesthesia Quick Evaluation

## 2013-03-22 NOTE — Preoperative (Signed)
Beta Blockers   Reason not to administer Beta Blockers:Not Applicable 

## 2013-03-22 NOTE — Op Note (Signed)
DATE OF SURGERY:  03/22/2013 TIME: 3:18 PM  PATIENT NAME:  Jack [name]   AGE: 79 y.o.    PRE-OPERATIVE DIAGNOSIS:  ENDSTAGE DJD RIGHT KNEE  POST-OPERATIVE DIAGNOSIS:  Same  PROCEDURE:  Procedure(s): TOTAL KNEE ARTHROPLASTY   SURGEON:  Eulas Post, MD   ASSISTANT:  Janace Litten, OPA-C, present and scrubbed throughout the case, critical for assistance with exposure, retraction, instrumentation, and closure.   OPERATIVE IMPLANTS: Depuy PFC Sigma, Posterior Stabilized.  Femur size 5, Tibia size 4, Patella size 41 3-peg oval button, with a 10 mm polyethylene insert.   PREOPERATIVE INDICATIONS:  Jack [name] is a 79 y.o. year old male with end stage bone on bone degenerative arthritis of the knee who failed conservative treatment, including injections, antiinflammatories, activity modification, and assistive devices, and had significant impairment of their activities of daily living, and elected for Total Knee Arthroplasty.   The risks, benefits, and alternatives were discussed at length including but not limited to the risks of infection, bleeding, nerve injury, stiffness, blood clots, the need for revision surgery, cardiopulmonary complications, among others, and they were willing to proceed.   OPERATIVE DESCRIPTION:  The patient was brought to the operative room and placed in a supine position.  General anesthesia was administered.  IV antibiotics were given.  The lower extremity was prepped and draped in the usual sterile fashion.  Time out was performed.  The leg was elevated and exsanguinated and the tourniquet was inflated.  Anterior quadriceps tendon splitting approach was performed.  The patella was everted and osteophytes were removed.  The anterior horn of the medial and lateral meniscus was removed.   The distal femur was opened with the drill and the intramedullary distal femoral cutting jig was utilized, set at 5 degrees resecting 10 mm off the distal femur.  Care was  taken to protect the collateral ligaments.  Then the extramedullary tibial cutting jig was utilized making the appropriate cut using the anterior tibial crest as a reference building in appropriate posterior slope.  Care was taken during the cut to protect the medial and collateral ligaments.  The proximal tibia was removed along with the posterior horns of the menisci.  The PCL was sacrificed.    The extensor gap was measured and was approximately 10mm.    The distal femoral sizing jig was applied, taking care to avoid notching.  Then the 4-in-1 cutting jig was applied and the anterior and posterior femur was cut, along with the chamfer cuts.  All posterior osteophytes were removed.  The flexion gap was then measured and was symmetric with the extension gap.  I completed the distal femoral preparation using the appropriate jig to prepare the box.  The patella was then measured, and cut with the saw.  It measured 22 before the cut, and 13 after the cuts.  The proximal tibia sized and prepared accordingly with the reamer and the punch, and then all components were trialed with the 10mm poly insert.  The knee was found to have excellent balance and full motion.    The above named components were then cemented into place and all excess cement was removed.  The real polyethylene implant was placed.  The knee was easily taken through a range of motion and the patella tracked well and the knee irrigated copiously and the parapatellar and subcutaneous tissue closed with vicryl, and monocryl with steri strips for the skin.  The wounds were injected with marcaine, and dressed with sterile gauze and  the tourniquet released and the patient was awakened and returned to the PACU in stable and satisfactory condition.  There were no complications.  Total tourniquet time was ~80 minutes.

## 2013-03-22 NOTE — Anesthesia Postprocedure Evaluation (Signed)
  Anesthesia Post-op Note  Patient: Jack [name]  Procedure(s) Performed: Procedure(s) with comments: TOTAL KNEE ARTHROPLASTY (Right) - righ total knee arthroplasty  Patient Location: PACU  Anesthesia Type:spinal  Level of Consciousness: awake  Airway and Oxygen Therapy: Patient Spontanous Breathing  Post-op Pain: mild  Post-op Assessment: Post-op Vital signs reviewed  Post-op Vital Signs: Reviewed  Complications: No apparent anesthesia complications

## 2013-03-22 NOTE — Transfer of Care (Signed)
Immediate Anesthesia Transfer of Care Note  Patient: Jack [name]  Procedure(s) Performed: Procedure(s) with comments: TOTAL KNEE ARTHROPLASTY (Right) - righ total knee arthroplasty  Patient Location: PACU  Anesthesia Type:MAC and Spinal  Level of Consciousness: awake, patient cooperative and responds to stimulation  Airway & Oxygen Therapy: Patient Spontanous Breathing and Patient connected to nasal cannula oxygen  Post-op Assessment: Report given to PACU RN and Post -op Vital signs reviewed and stable  Post vital signs: Reviewed and stable  Complications: No apparent anesthesia complications

## 2013-03-22 NOTE — H&P (Signed)
PREOPERATIVE H&P  Chief Complaint: DJD RIGHT KNEE  HPI: Jack [name] is a 79 y.o. male who presents for preoperative history and physical with a diagnosis of DJD RIGHT KNEE. Symptoms are rated as moderate to severe, and have been worsening.  This is significantly impairing activities of daily living.  He has elected for surgical management. He has failed injections, activity modification, couldn't tolerate NSAIDS, use of a cane/walker.  Past Medical History  Diagnosis Date  . GERD (gastroesophageal reflux disease)   . Osteoarthritis   . Alcohol abuse     in recovery 2006  . 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 wine only  . Irritable bowel   . Hyperlipidemia   . Insomnia   . PAC (premature atrial contraction)     stable (ruled out for a fib)  . Prostatitis     chronic bacterial  . Pseudogout   . Sinus problem     chronic  . Cellulitis     2nd to T. Pedis(not at present)  . Hearing loss 03-10-12    bilateral hearing aids  . Complication of anesthesia 03-10-12    after shoulder  surgery -difficulty awakening,breathing problems  . Emphysema 03-10-12    tx. inhalers, uses steam room frequently  . Cramping of feet 03-10-12    cramping of both legs and hands occ.  . Non Hodgkin's lymphoma 03-10-12     '07-1 yr. in remission(Shadad)-not seeing now  . Osteoarthritis of left knee 03/17/2012  . Glaucoma   . Macular degeneration   . Hypertension   . Shortness of breath    Past Surgical History  Procedure Laterality Date  . Esophagogastroduodenoscopy  01-27-02  . Inguinal hernia repair  1979  . Torn biceps repair  03-10-12    Left rotator cuff repair  . Hernia repair    . Cataract surgery  03-10-12    03-04-12(right)/ (03-11-12-left)  . Total knee arthroplasty  03/17/2012    Procedure: TOTAL KNEE ARTHROPLASTY;  Surgeon: Eulas Post, MD;  Location: [facility];  Service: Orthopedics;  Laterality: Left;  Marland Kitchen Eye surgery  2014    cataract extraction  with IOL both eyes staged  . Colonoscopy    . Rotator cuff repair Left    History   Social History  . Marital Status: Widowed    Spouse Name: N/A    Number of Children: 3  . Years of Education: 18   Occupational History  . newspaper Programmer, multimedia     [employer]   Social History Main Topics  . Smoking status: Former Smoker    Types: Cigarettes    Quit date: 06/24/1978  . Smokeless tobacco: Never Used  . Alcohol Use: 6.7 oz/week    7 Glasses of wine, 5 Drinks containing 0.5 oz of alcohol per week     Comment: Currently only drinks wine - in moderation. He is cutting back-past hx. ETOH abuse  . Drug Use: No  . Sexual Activity: Yes   Other Topics Concern  . None   Social History Narrative   Loss adjuster, chartered. married '56- widowed '07. 1 son, 2 daughters, 4 grandchildren   work: former Counsellor; very active in civic affairs. lives alone: completed  renovating family farm/home in [place] but continues to make improvement ('12), keeps up his beach house; takes his extended family on great trips - [place] and [place] July '12. Socially active.  Active in AA      End of  Life issues: He does want CPR; no prolonged intubation; no futile or heroic measure to maintain him if the quality of life isn't good. HCPOA  Son - Olden Klauer ([phone].   Family History  Problem Relation Age of Onset  . Coronary artery disease Father   . Heart attack Father   . Cancer Brother     colon  . Other Other     TB   Allergies  Allergen Reactions  . Amoxicillin     REACTION: rash  . Penicillins Rash   Prior to Admission medications   Medication Sig Start Date End Date Taking? Authorizing Provider  albuterol (PROVENTIL HFA;VENTOLIN HFA) 108 (90 BASE) MCG/ACT inhaler Inhale 2 puffs into the lungs every 6 (six) hours as needed for shortness of breath.    Yes Historical Provider, MD  albuterol (PROVENTIL HFA;VENTOLIN HFA) 108 (90 BASE) MCG/ACT inhaler Inhale 2 puffs into the lungs  every 6 (six) hours as needed for wheezing.   Yes Historical Provider, MD  alfuzosin (UROXATRAL) 10 MG 24 hr tablet Take 10 mg by mouth every morning.    Yes Historical Provider, MD  aspirin EC 81 MG tablet Take 81 mg by mouth daily.   Yes Historical Provider, MD  celecoxib (CELEBREX) 100 MG capsule Take 1 capsule (100 mg total) by mouth 2 (two) times daily. 03/17/12  Yes Eulas Post, MD  dorzolamide (TRUSOPT) 2 % ophthalmic solution Place 1 drop into both eyes 3 (three) times daily.    Yes Historical Provider, MD  esomeprazole (NEXIUM) 40 MG capsule Take 40 mg by mouth daily before breakfast.   Yes Historical Provider, MD  fexofenadine (ALLEGRA) 180 MG tablet Take 180 mg by mouth daily as needed (for allergies).    Yes Historical Provider, MD  Fluticasone-Salmeterol (ADVAIR DISKUS) 500-50 MCG/DOSE AEPB Inhale 1 puff into the lungs 2 (two) times daily. 12/31/12  Yes Jacques Navy, MD  Ginkgo Biloba 40 MG TABS Take 1 tablet by mouth 2 (two) times daily.    Yes Historical Provider, MD  hyoscyamine (LEVSIN SL) 0.125 MG SL tablet Place 0.125 mg under the tongue every 2 (two) hours as needed (for gas and cramps).   Yes Historical Provider, MD  magnesium oxide (MAG-OX) 400 MG tablet Take 400 mg by mouth daily.   Yes Historical Provider, MD  metoprolol succinate (TOPROL-XL) 50 MG 24 hr tablet Take 75 mg by mouth every morning. Take with or immediately following a meal.   Yes Historical Provider, MD  oxybutynin (DITROPAN-XL) 5 MG 24 hr tablet Take 5 mg by mouth daily.   Yes Historical Provider, MD  potassium chloride SA (K-DUR,KLOR-CON) 20 MEQ tablet Take 20 mEq by mouth daily.   Yes Historical Provider, MD  quiNINE (QUALAQUIN) 324 MG capsule Take 324 mg by mouth 2 (two) times daily.   Yes Historical Provider, MD  sildenafil (VIAGRA) 100 MG tablet Take 100 mg by mouth daily as needed for erectile dysfunction.   Yes Historical Provider, MD  simvastatin (ZOCOR) 20 MG tablet Take 20 mg by mouth every  evening.   Yes Historical Provider, MD  temazepam (RESTORIL) 30 MG capsule Take 30 mg by mouth at bedtime.   Yes Historical Provider, MD  triamcinolone cream (KENALOG) 0.1 % Apply topically 2 (two) times daily.   Yes Historical Provider, MD  Besifloxacin HCl (BESIVANCE) 0.6 % SUSP Place 1 drop into the right eye See admin instructions. Uses for 2 days after each monthly ocular injection.  Next injection due  03/12/2013.    Historical Provider, MD  diphenoxylate-atropine (LOMOTIL) 2.5-0.025 MG per tablet Take 1 tablet by mouth 4 (four) times daily as needed for diarrhea or loose stools.    Historical Provider, MD  Multiple Vitamins-Minerals (PRESERVISION AREDS 2 PO) Take 1 tablet by mouth 2 (two) times daily.    Historical Provider, MD  oxyCODONE-acetaminophen (ROXICET) 5-325 MG per tablet Take 1-2 tablets by mouth every 6 (six) hours as needed for pain. 03/17/12   Eulas Post, MD     Positive ROS: All other systems have been reviewed and were otherwise negative with the exception of those mentioned in the HPI and as above.  Physical Exam: General: Alert, no acute distress Cardiovascular: No pedal edema Respiratory: No cyanosis, no use of accessory musculature GI: No organomegaly, abdomen is soft and non-tender Skin: No lesions in the area of chief complaint Neurologic: Sensation intact distally Psychiatric: Patient is competent for consent with normal mood and affect Lymphatic: No axillary or cervical lymphadenopathy  MUSCULOSKELETAL: right knee with crepitance and rom 0-125.  Varus.   Assessment: DJD RIGHT KNEE  Plan: Plan for Procedure(s): TOTAL KNEE ARTHROPLASTY  The risks benefits and alternatives were discussed with the patient including but not limited to the risks of nonoperative treatment, versus surgical intervention including infection, bleeding, nerve injury,  blood clots, cardiopulmonary complications, morbidity, mortality, among others, and they were willing to proceed.    Eulas Post, MD Cell [phone]   03/22/2013 1:05 PM

## 2013-03-22 NOTE — Progress Notes (Signed)
Orthopedic Tech Progress Note Patient Details:  [name] Sep 07, 1928 [id number]  Patient ID: [name], male   DOB: 03/19/1929, 79 y.o.   MRN: [id number]   Shawnie Pons 03/22/2013, 5:00 PMTrapeze bar.

## 2013-03-22 NOTE — Anesthesia Procedure Notes (Signed)
Spinal  Patient location during procedure: OR Start time: 03/22/2013 1:16 PM End time: 03/22/2013 1:21 PM Staffing Performed by: anesthesiologist  Preanesthetic Checklist Completed: patient identified, site marked, surgical consent, pre-op evaluation, timeout performed, IV checked, risks and benefits discussed and monitors and equipment checked Spinal Block Patient position: right lateral decubitus Prep: Betadine Patient monitoring: heart rate, cardiac monitor, continuous pulse ox and blood pressure Approach: right paramedian Location: L2-3 Injection technique: single-shot Needle Needle type: Sprotte  Needle gauge: 22 G Needle length: 9 cm Needle insertion depth: 5 cm Assessment Sensory level: T8 Additional Notes One dural puncture, clear CSF, dates checked on kit and meds. Patient without c/o CF

## 2013-03-23 ENCOUNTER — Encounter (HOSPITAL_COMMUNITY): Payer: Self-pay | Admitting: Orthopedic Surgery

## 2013-03-23 LAB — BASIC METABOLIC PANEL
CO2: 24 mEq/L (ref 19–32)
Glucose, Bld: 119 mg/dL — ABNORMAL HIGH (ref 70–99)
Potassium: 4.3 mEq/L (ref 3.5–5.1)
Sodium: 132 mEq/L — ABNORMAL LOW (ref 135–145)

## 2013-03-23 LAB — CBC
Hemoglobin: 11.2 g/dL — ABNORMAL LOW (ref 13.0–17.0)
RBC: 3.67 MIL/uL — ABNORMAL LOW (ref 4.22–5.81)
WBC: 10.6 10*3/uL — ABNORMAL HIGH (ref 4.0–10.5)

## 2013-03-23 MED ORDER — SPIRITUS FRUMENTI
1.0000 | Freq: Every day | ORAL | Status: DC
  Administered 2013-03-24 – 2013-03-25 (×2): 1 via ORAL
  Filled 2013-03-23 (×7): qty 2

## 2013-03-23 MED ORDER — FLUTICASONE-SALMETEROL 500-50 MCG/DOSE IN AEPB
1.0000 | INHALATION_SPRAY | Freq: Two times a day (BID) | RESPIRATORY_TRACT | Status: DC
  Administered 2013-03-23 – 2013-03-26 (×6): 1 via RESPIRATORY_TRACT

## 2013-03-23 NOTE — Progress Notes (Addendum)
Patient feeling some shortness of breath this am after getting out of bed this am with therapy, adamantly requesting to have his Advair. No other s/s of distress.  Spoke with Dr. Dion Saucier, ok to order Advair 1 puff BID as he uses at home.  Phamacy does not readily stock Advair, so patient brought Advair in from home, per pharmacy ok.  Per pharmacy, Advair to be placed in patient's med cart, will be returned upon d/c.  Nursing will continue to monitor.

## 2013-03-23 NOTE — Progress Notes (Signed)
OT Cancellation Note and Discharge  Patient Details Name: Jack [name] MRN: [id number] DOB: 1928/10/06   Cancelled Treatment:    Reason Eval/Treat Not Completed: Other (comment). Per MD note, pt to D/C to SNF Thursday or Friday and pt is Medicare. Will defer OT eval and treatment to that facility as they deem appropriate. Acute OT will sign off.  Evette Georges [phone] 03/23/2013, 2:11 PM

## 2013-03-23 NOTE — Progress Notes (Signed)
Orthopedic Tech Progress Note Patient Details:  Jack [name] 1928-09-13 [id number]  Ortho Devices Type of Ortho Device: Knee Immobilizer Ortho Device/Splint Location: Replacement knee immobilizer Ortho Device/Splint Interventions: Application   [name], [name] 03/23/2013, 2:23 PM

## 2013-03-23 NOTE — Evaluation (Signed)
Physical Therapy Evaluation Patient Details Name: Jack [name] MRN: [id number] DOB: [birth year] Today's Date: 03/23/2013 Time: 4098-1191 PT Time Calculation (min): 23 min  PT Assessment / Plan / Recommendation History of Present Illness  s/p RTKA  Clinical Impression  Patient is s/p above surgery resulting in functional limitations due to the deficits listed below (see PT Problem List).  Patient will benefit from skilled PT to increase their independence and safety with mobility to allow discharge to the venue listed below.       PT Assessment  Patient needs continued PT services    Follow Up Recommendations  SNF    Does the patient have the potential to tolerate intense rehabilitation      Barriers to Discharge        Equipment Recommendations  Rolling walker with 5" wheels;3in1 (PT)    Recommendations for Other Services     Frequency 7X/week    Precautions / Restrictions Precautions Precautions: Knee Required Braces or Orthoses: Knee Immobilizer - Right Knee Immobilizer - Right: On when out of bed or walking;Discontinue post op day 2 Restrictions RLE Weight Bearing: Weight bearing as tolerated   Pertinent Vitals/Pain 6/10 R knee; was premedicated; patient repositioned for comfort and optimal knee ext      Mobility  Bed Mobility Bed Mobility: Supine to Sit Supine to Sit: 4: Min guard;With rails Details for Bed Mobility Assistance: Cues for technique; overall smooth motion Transfers Transfers: Sit to Stand;Stand to Sit Sit to Stand: 4: Min assist;From bed;With upper extremity assist Stand to Sit: 4: Min assist;To chair/3-in-1;With upper extremity assist Details for Transfer Assistance: Cues for tecnhique, safety, handplacemtn Ambulation/Gait Ambulation/Gait Assistance: 4: Min guard Ambulation Distance (Feet): 7 Feet Assistive device: Rolling walker Ambulation/Gait Assistance Details: Cues for gait sequence and to activate R Quad for stance stability Gait  Pattern: Step-to pattern    Exercises     PT Diagnosis: Difficulty walking;Acute pain  PT Problem List: Decreased strength;Decreased range of motion;Decreased activity tolerance;Decreased balance;Decreased mobility;Decreased knowledge of use of DME;Pain PT Treatment Interventions: DME instruction;Gait training;Stair training;Functional mobility training;Therapeutic activities;Therapeutic exercise;Patient/family education     PT Goals(Current goals can be found in the care plan section) Acute Rehab PT Goals Patient Stated Goal: go to [place] in Dec PT Goal Formulation: With patient Time For Goal Achievement: 03/30/13 Potential to Achieve Goals: Good  Visit Information  Last PT Received On: 03/23/13 Assistance Needed: +1 History of Present Illness: s/p RTKA       Prior Functioning  Home Living Family/patient expects to be discharged to:: Skilled nursing facility Living Arrangements: Alone Prior Function Level of Independence: Independent Communication Communication: No difficulties    Cognition  Cognition Arousal/Alertness: Awake/alert Behavior During Therapy: WFL for tasks assessed/performed Overall Cognitive Status: Within Functional Limits for tasks assessed    Extremity/Trunk Assessment Upper Extremity Assessment Upper Extremity Assessment: Overall WFL for tasks assessed Lower Extremity Assessment Lower Extremity Assessment: RLE deficits/detail RLE Deficits / Details: Decr AROM and strength, limited by pain postop; Quad set present   Balance    End of Session PT - End of Session Equipment Utilized During Treatment: Gait belt Activity Tolerance: Patient tolerated treatment well Patient left: in chair;with call bell/phone within reach;with family/visitor present Nurse Communication: Mobility status CPM Right Knee CPM Right Knee: Off  GP     Jack [name], Jack [name] [phone]  03/23/2013, 4:19 PM

## 2013-03-23 NOTE — Progress Notes (Signed)
UR COMPLETED  

## 2013-03-23 NOTE — Progress Notes (Addendum)
Patient ID: Jack [name], male   DOB: 1929/04/07, 79 y.o.   MRN: [id number]     Subjective:  Patient reports pain as mild.  Patient states that he is doing well and has been sitting on the side of the bed this AM.  Objective:   VITALS:   Filed Vitals:   03/22/13 1758 03/22/13 2006 03/22/13 2013 03/23/13 0526  BP: 154/89  147/84 130/71  Pulse: 64  76 73  Temp: 97.9 F (36.6 C)  97.5 F (36.4 C) 98.4 F (36.9 C)  TempSrc:      Resp: 18  19 18   SpO2: 96% 99% 97% 98%    ABD soft Sensation intact distally Dorsiflexion/Plantar flexion intact Incision: dressing C/D/I and no drainage  Lab Results  Component Value Date   WBC 10.6* 03/23/2013   HGB 11.2* 03/23/2013   HCT 33.3* 03/23/2013   MCV 90.7 03/23/2013   PLT 181 03/23/2013     Assessment/Plan: 1 Day Post-Op   Principal Problem:   Osteoarthritis of right knee   Advance diet Up with therapy WBAT Plan for SNF on Thursday of Friday Dry dressing PRN Ok to have glass of wine with dinner per pts. Request.  Haskel Khan 03/23/2013, 7:21 AM   Teryl Lucy, MD Cell [phone]

## 2013-03-23 NOTE — Progress Notes (Signed)
Physical Therapy Note  Making progress with mobility and knee control  8/10 pain with therex RN notified patient repositioned for comfort and still optimal knee extension   03/23/13 1629  PT Visit Information  Last PT Received On 03/23/13  Assistance Needed +1  History of Present Illness s/p RTKA  PT Time Calculation  PT Start Time 1315  PT Stop Time 1340  PT Time Calculation (min) 25 min  Subjective Data  Subjective Not wanting to walk far this afternoon  Patient Stated Goal go to [place] in Dec  Precautions  Precautions Knee  Required Braces or Orthoses Knee Immobilizer - Right  Knee Immobilizer - Right On when out of bed or walking;Discontinue post op day 2  Restrictions  RLE Weight Bearing WBAT  Cognition  Arousal/Alertness Awake/alert  Behavior During Therapy WFL for tasks assessed/performed  Overall Cognitive Status Within Functional Limits for tasks assessed  Bed Mobility  Bed Mobility Sit to Supine;Supine to Sit  Supine to Sit 4: Min guard;With rails  Sit to Supine 4: Min guard;With rail  Details for Bed Mobility Assistance Cues for technique; overall smooth motion  Transfers  Transfers Sit to Stand;Stand to Sit  Sit to Stand 4: Min guard  Stand to Sit 4: Min guard  Details for Transfer Assistance Cues for tecnhique, safety, handplacemtn  Ambulation/Gait  Ambulation/Gait Assistance 4: Min guard  Ambulation Distance (Feet) 4 Feet (to chair)  Assistive device Rolling walker  Ambulation/Gait Assistance Details cues for gait sequence  Gait Pattern Step-to pattern  Exercises  Exercises Total Joint  Total Joint Exercises  Ankle Circles/Pumps AROM;Both;10 reps  Quad Sets AROM;Both;10 reps  Short Arc [employer];Right;10 reps  Heel Slides AAROM;AROM;Right;10 reps  Hip ABduction/ADduction AROM;Right;10 reps  Straight Leg Raises AROM;AAROM;Right;10 reps  PT - End of Session  Equipment Utilized During Treatment Gait belt  Activity Tolerance Patient tolerated  treatment well  Patient left in chair;with call bell/phone within reach;with family/visitor present  Nurse Communication Mobility status  PT - Assessment/Plan  PT Plan Current plan remains appropriate  PT Frequency 7X/week  Follow Up Recommendations SNF  PT equipment Rolling walker with 5" wheels;3in1 (PT)  PT Goal Progression  Progress towards PT goals Progressing toward goals  Acute Rehab PT Goals  PT Goal Formulation With patient  Time For Goal Achievement 03/30/13  Potential to Achieve Goals Good  PT General Charges  $$ ACUTE PT VISIT 1 Procedure  PT Treatments  $Therapeutic Exercise 8-22 mins  $Therapeutic Activity 8-22 mins   [place], [place] [phone]

## 2013-03-24 DIAGNOSIS — K567 Ileus, unspecified: Secondary | ICD-10-CM | POA: Diagnosis not present

## 2013-03-24 LAB — CBC
HCT: 31.9 % — ABNORMAL LOW (ref 39.0–52.0)
Hemoglobin: 11 g/dL — ABNORMAL LOW (ref 13.0–17.0)
MCH: 31.1 pg (ref 26.0–34.0)
MCV: 90.1 fL (ref 78.0–100.0)
RBC: 3.54 MIL/uL — ABNORMAL LOW (ref 4.22–5.81)
WBC: 12.6 10*3/uL — ABNORMAL HIGH (ref 4.0–10.5)

## 2013-03-24 NOTE — Progress Notes (Signed)
Orthopedic Tech Progress Note Patient Details:  Jack [name] 12-04-1928 [id number] On cpm at 8:15 pm RLE 0-60 Patient ID: Jack [name], male   DOB: 17-Oct-1928, 79 y.o.   MRN: [id number]   Jack [name] 03/24/2013, 8:16 PM

## 2013-03-24 NOTE — Progress Notes (Signed)
     Subjective:  Patient reports pain as mild.  Positive flatus and bm, but mild nausea.  Objective:   VITALS:   Filed Vitals:   03/24/13 0000 03/24/13 0200 03/24/13 0400 03/24/13 0539  BP:    128/58  Pulse:    77  Temp:    98.3 F (36.8 C)  TempSrc:      Resp: 18  20 18   Height:      Weight:      SpO2:  97%  98%    Neurologically intact Abdomen distended, no rebound. EHL FHL firing, in cpm dressing intact  Lab Results  Component Value Date   WBC 12.6* 03/24/2013   HGB 11.0* 03/24/2013   HCT 31.9* 03/24/2013   MCV 90.1 03/24/2013   PLT 187 03/24/2013     Assessment/Plan: 2 Days Post-Op   Principal Problem:   Osteoarthritis of right knee   Up with therapy Discharge to SNF probably fri if post op ileus resolves Change to clears, add IVF, recheck bmp in am   Robben Jagiello P 03/24/2013, 8:18 AM   Teryl Lucy, MD Cell [phone]

## 2013-03-24 NOTE — Plan of Care (Signed)
Problem: Consults Goal: Diagnosis- Total Joint Replacement Outcome: Completed/Met Date Met:  03/24/13 Primary Total Knee     

## 2013-03-24 NOTE — Progress Notes (Signed)
Physical Therapy Treatment Patient Details Name: Jack [name] MRN: [id number] DOB: Jan 16, 1929 Today's Date: 03/24/2013 Time: 4098-1191 PT Time Calculation (min): 32 min  PT Assessment / Plan / Recommendation  History of Present Illness s/p RTKA   PT Comments   Pain limiting activity tolerance this pm, though pt was willing to practice transfers in bathroom; Anticipate dc to SNF tomorrow  Follow Up Recommendations  SNF     Does the patient have the potential to tolerate intense rehabilitation     Barriers to Discharge        Equipment Recommendations  Rolling walker with 5" wheels;3in1 (PT)    Recommendations for Other Services    Frequency 7X/week   Progress towards PT Goals Progress towards PT goals: Progressing toward goals  Plan Current plan remains appropriate    Precautions / Restrictions Precautions Precautions: Knee Required Braces or Orthoses: Knee Immobilizer - Right Knee Immobilizer - Right: On when out of bed or walking;Discontinue post op day 2 Restrictions RLE Weight Bearing: Weight bearing as tolerated   Pertinent Vitals/Pain 6/10 with knee flexion; 3/10 once in recliner at end of session    Mobility  Transfers Transfers: Sit to Stand;Stand to Sit Sit to Stand: 4: Min guard;From chair/3-in-1;With upper extremity assist Stand to Sit: 4: Min assist;To chair/3-in-1;With armrests;With upper extremity assist Details for Transfer Assistance: Cues for tecnhique, safety, handplacement; More painful this afternoon, so cues also for prepositioning RLE for comfort Ambulation/Gait Ambulation/Gait Assistance: 4: Min guard Ambulation Distance (Feet): 25 Feet (to/from bathroom) Assistive device: Rolling walker Ambulation/Gait Assistance Details: Verbal and tactile cues for gait sequence Gait Pattern: Step-to pattern Gait velocity: quite slow    Exercises Total Joint Exercises Quad Sets: AROM;Right;10 reps Long Arc Quad: AAROM;Right;5 reps Knee Flexion:  AROM;Right;5 reps;Seated (Extremely painful)   PT Diagnosis:    PT Problem List:   PT Treatment Interventions:     PT Goals (current goals can now be found in the care plan section) Acute Rehab PT Goals Patient Stated Goal: go to [place] in Dec PT Goal Formulation: With patient Time For Goal Achievement: 03/30/13 Potential to Achieve Goals: Good  Visit Information  Last PT Received On: 03/24/13 Assistance Needed: +1 History of Present Illness: s/p RTKA    Subjective Data  Subjective: Uncomfortable, but willing to walk Patient Stated Goal: go to [place] in Dec   Cognition  Cognition Arousal/Alertness: Awake/alert Behavior During Therapy: WFL for tasks assessed/performed Overall Cognitive Status: Within Functional Limits for tasks assessed    Balance     End of Session PT - End of Session Activity Tolerance: Patient tolerated treatment well Patient left: in chair;with call bell/phone within reach;with family/visitor present Nurse Communication: Mobility status   GP     Jack [name] [place], [place] [phone]  03/24/2013, 4:10 PM

## 2013-03-24 NOTE — Progress Notes (Signed)
Physical Therapy Treatment Patient Details Name: Jack [name] MRN: [id number] DOB: [birth year] Today's Date: 03/24/2013 Time: 1002-1030 PT Time Calculation (min): 28 min  PT Assessment / Plan / Recommendation  History of Present Illness s/p RTKA   PT Comments   Making progress with mobility/amb; No noted knee buckle in stance Will need to focus on therex next session  Follow Up Recommendations  SNF     Does the patient have the potential to tolerate intense rehabilitation     Barriers to Discharge        Equipment Recommendations  Rolling walker with 5" wheels;3in1 (PT)    Recommendations for Other Services    Frequency 7X/week   Progress towards PT Goals Progress towards PT goals: Progressing toward goals  Plan Current plan remains appropriate    Precautions / Restrictions Precautions Precautions: Knee Required Braces or Orthoses: Knee Immobilizer - Right Knee Immobilizer - Right: On when out of bed or walking;Discontinue post op day 2 Restrictions RLE Weight Bearing: Weight bearing as tolerated   Pertinent Vitals/Pain 6/10 R knee; RN provided medication to assist with pain control     Mobility  Bed Mobility Bed Mobility: Supine to Sit Supine to Sit: 4: Min guard Sit to Supine: 4: Min guard Details for Bed Mobility Assistance: Cues for technique; overall smooth motion Transfers Transfers: Sit to Stand;Stand to Sit Sit to Stand: 4: Min guard Stand to Sit: 4: Min guard Details for Transfer Assistance: Cues for tecnhique, safety, handplacemtn Ambulation/Gait Ambulation/Gait Assistance: 4: Min guard Ambulation Distance (Feet): 60 Feet (30x2) Assistive device: Rolling walker Ambulation/Gait Assistance Details: Verbal and tactile cues for gait sequence Gait Pattern: Step-to pattern Gait velocity: quite slow    Exercises     PT Diagnosis:    PT Problem List:   PT Treatment Interventions:     PT Goals (current goals can now be found in the care plan  section) Acute Rehab PT Goals Patient Stated Goal: go to [place] in Dec PT Goal Formulation: With patient Time For Goal Achievement: 03/30/13 Potential to Achieve Goals: Good  Visit Information  Last PT Received On: 03/24/13 Assistance Needed: +1 History of Present Illness: s/p RTKA    Subjective Data  Subjective: Uncomfortable, but willing to walk Patient Stated Goal: go to [place] in Dec   Cognition  Cognition Arousal/Alertness: Awake/alert Behavior During Therapy: WFL for tasks assessed/performed Overall Cognitive Status: Within Functional Limits for tasks assessed    Balance     End of Session PT - End of Session Activity Tolerance: Patient tolerated treatment well Patient left: in chair;with call bell/phone within reach;with family/visitor present Nurse Communication: Mobility status   GP     [name] [place], [place] [phone]  03/24/2013, 12:21 PM

## 2013-03-25 ENCOUNTER — Inpatient Hospital Stay (HOSPITAL_COMMUNITY): Payer: Medicare Other

## 2013-03-25 LAB — BASIC METABOLIC PANEL
BUN: 16 mg/dL (ref 6–23)
BUN: 18 mg/dL (ref 6–23)
CO2: 24 mEq/L (ref 19–32)
Calcium: 8.4 mg/dL (ref 8.4–10.5)
Chloride: 95 mEq/L — ABNORMAL LOW (ref 96–112)
Creatinine, Ser: 0.92 mg/dL (ref 0.50–1.35)
Creatinine, Ser: 0.97 mg/dL (ref 0.50–1.35)
GFR calc Af Amer: 89 mL/min — ABNORMAL LOW (ref >90)
GFR calc Af Amer: 87 mL/min — ABNORMAL LOW (ref >90)
GFR calc non Af Amer: 77 mL/min — ABNORMAL LOW (ref >90)
GFR calc non Af Amer: 75 mL/min — ABNORMAL LOW (ref >90)
Glucose, Bld: 91 mg/dL (ref 70–99)

## 2013-03-25 LAB — CBC
HCT: 28.8 % — ABNORMAL LOW (ref 39.0–52.0)
Hemoglobin: 9.9 g/dL — ABNORMAL LOW (ref 13.0–17.0)
MCHC: 34.4 g/dL (ref 30.0–36.0)
MCV: 90 fL (ref 78.0–100.0)
RDW: 13.6 % (ref 11.5–15.5)
WBC: 7.7 10*3/uL (ref 4.0–10.5)

## 2013-03-25 NOTE — Progress Notes (Signed)
Physical Therapy Treatment Patient Details Name: Jack [name] MRN: [id number] DOB: 04-16-1929 Today's Date: 03/25/2013 Time: 1478-2956 PT Time Calculation (min): 29 min  PT Assessment / Plan / Recommendation  History of Present Illness s/p RTKA   PT Comments   Pt reports pain as moderate with activity (5/10).  Moves slowly but is steady on feet.  Fatigues quickly.  Cont with current POC & d/c recommendation of SNF.  \   Follow Up Recommendations  SNF     Does the patient have the potential to tolerate intense rehabilitation     Barriers to Discharge        Equipment Recommendations  Rolling walker with 5" wheels;3in1 (PT)    Recommendations for Other Services    Frequency 7X/week   Progress towards PT Goals Progress towards PT goals: Progressing toward goals  Plan Current plan remains appropriate    Precautions / Restrictions Precautions Precautions: Knee Required Braces or Orthoses: Knee Immobilizer - Right Knee Immobilizer - Right: On when out of bed or walking;Discontinue post op day 2 Restrictions RLE Weight Bearing: Weight bearing as tolerated   Pertinent Vitals/Pain 5/10 Rt knee with activity.  Premedicated.     Mobility  Bed Mobility Bed Mobility: Supine to Sit;Sitting - Scoot to Edge of Bed Supine to Sit: 4: Min assist;HOB flat Sitting - Scoot to [place] of Bed: 4: Min guard Details for Bed Mobility Assistance: (A) for RLE management Transfers Transfers: Sit to Stand;Stand to Sit Sit to Stand: 4: Min guard;With upper extremity assist;From bed Stand to Sit: 4: Min guard;With upper extremity assist;With armrests;To chair/3-in-1 Details for Transfer Assistance: Cues to reinforce hand placement & RLE positioning before sitting Ambulation/Gait Ambulation/Gait Assistance: 4: Min guard Ambulation Distance (Feet): 40 Feet Assistive device: Rolling walker Ambulation/Gait Assistance Details: Slow but steady.  Fatigues quickly.  Cues for safe RW advancement Gait  Pattern: Step-to pattern;Decreased step length - left;Decreased stance time - right;Decreased weight shift to right Gait velocity: decreased General Gait Details: Pt relies heavily on RW with UE's.  Encouragement to increase WBing through RLE during stance phase    Exercises Total Joint Exercises Ankle Circles/Pumps: AROM;Both;10 reps Quad Sets: AROM;Both;10 reps Heel Slides: AAROM;Strengthening;Right;10 reps Hip ABduction/ADduction: AAROM;Strengthening;Right;10 reps Straight Leg Raises: AAROM;Strengthening;Right;10 reps      PT Goals (current goals can now be found in the care plan section) Acute Rehab PT Goals Patient Stated Goal: go to [place] in Dec PT Goal Formulation: With patient Time For Goal Achievement: 03/30/13 Potential to Achieve Goals: Good  Visit Information  Last PT Received On: 03/25/13 Assistance Needed: +1 History of Present Illness: s/p RTKA    Subjective Data  Patient Stated Goal: go to [place] in Dec   Cognition  Cognition Arousal/Alertness: Awake/alert Behavior During Therapy: WFL for tasks assessed/performed Overall Cognitive Status: Within Functional Limits for tasks assessed    Balance     End of Session PT - End of Session Equipment Utilized During Treatment: Gait belt;Right knee immobilizer Activity Tolerance: Patient tolerated treatment well Patient left: in chair;with call bell/phone within reach Nurse Communication: Mobility status   GP     Lara Mulch 03/25/2013, 11:34 AM   Verdell Face, PTA [phone] 03/25/2013

## 2013-03-25 NOTE — Discharge Summary (Addendum)
Physician Discharge Summary  Patient ID: Jack [name] MRN: [id number] DOB/AGE: 1929/05/30 79 y.o.  Admit date: 03/22/2013 Discharge date: 03/26/2013  Admission Diagnoses:  Osteoarthritis of right knee  Discharge Diagnoses:  Principal Problem:   Osteoarthritis of right knee Active Problems:   Ileus, post op TKA hyponatremia, mild  Past Medical History  Diagnosis Date  . GERD (gastroesophageal reflux disease)   . Osteoarthritis   . Alcohol abuse     in recovery 2006  . 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 wine only  . Irritable bowel   . Hyperlipidemia   . Insomnia   . PAC (premature atrial contraction)     stable (ruled out for a fib)  . Prostatitis     chronic bacterial  . Pseudogout   . Sinus problem     chronic  . Cellulitis     2nd to T. Pedis(not at present)  . Hearing loss 03-10-12    bilateral hearing aids  . Complication of anesthesia 03-10-12    after shoulder  surgery -difficulty awakening,breathing problems  . Emphysema 03-10-12    tx. inhalers, uses steam room frequently  . Cramping of feet 03-10-12    cramping of both legs and hands occ.  . Non Hodgkin's lymphoma 03-10-12     '07-1 yr. in remission([facility])-not seeing now  . Osteoarthritis of left knee 03/17/2012  . Glaucoma   . Macular degeneration   . Hypertension   . Shortness of breath     Surgeries: Procedure(s): TOTAL KNEE ARTHROPLASTY on 03/22/2013   Consultants (if any):    Discharged Condition: Improved  Hospital Course: Jack [name] is an 79 y.o. male who was admitted 03/22/2013 with a diagnosis of Osteoarthritis of right knee and went to the operating room on 03/22/2013 and underwent the above named procedures.    He was given perioperative antibiotics:      Anti-infectives   Start     Dose/Rate Route Frequency Ordered Stop   03/22/13 2200  quiNINE (QUALAQUIN) capsule 324 mg     324 mg Oral 2 times daily 03/22/13 1808     03/22/13 2130   clindamycin (CLEOCIN) IVPB 600 mg     600 mg 100 mL/hr over 30 Minutes Intravenous Every 6 hours 03/22/13 1808 03/23/13 0400   03/22/13 0600  clindamycin (CLEOCIN) IVPB 900 mg     900 mg 100 mL/hr over 30 Minutes Intravenous On call to O.R. 03/21/13 1314 03/22/13 1315    .  He was given sequential compression devices, early ambulation, and lovenox for DVT prophylaxis.  He had mild abdominal distension with nausea POD 1 and was changed to clear liquid diet and given reglan until his ileus resolved.  He had a substantial bm on Friday, and ordered fluid restriction for 72 hrs after discharge at the snf, along with a follow up lab to be sent to me and Dr. Arthur Holms.  His hyponatremia was observed and asymptomatic.   He benefited maximally from the hospital stay and there were no complications.    Recent vital signs:  Filed Vitals:   03/26/13 0541  BP: 130/58  Pulse: 79  Temp: 97.9 F (36.6 C)  Resp: 16    Recent laboratory studies:  Lab Results  Component Value Date   HGB 9.9* 03/25/2013   HGB 11.0* 03/24/2013   HGB 11.2* 03/23/2013   Lab Results  Component Value Date   WBC 7.7 03/25/2013   PLT 141* 03/25/2013  Lab Results  Component Value Date   INR 0.93 03/22/2013   Lab Results  Component Value Date   NA 129* 03/25/2013   K 4.5 03/25/2013   CL 95* 03/25/2013   CO2 24 03/25/2013   BUN 18 03/25/2013   CREATININE 0.97 03/25/2013   GLUCOSE 105* 03/25/2013    Discharge Medications:     Medication List    STOP taking these medications       oxyCODONE-acetaminophen 5-325 MG per tablet  Commonly known as:  ROXICET  Replaced by:  oxyCODONE-acetaminophen 10-325 MG per tablet      TAKE these medications       albuterol 108 (90 BASE) MCG/ACT inhaler  Commonly known as:  PROVENTIL HFA;VENTOLIN HFA  Inhale 2 puffs into the lungs every 6 (six) hours as needed for shortness of breath.     albuterol 108 (90 BASE) MCG/ACT inhaler  Commonly known as:  PROVENTIL HFA;VENTOLIN HFA   Inhale 2 puffs into the lungs every 6 (six) hours as needed for wheezing.     alfuzosin 10 MG 24 hr tablet  Commonly known as:  UROXATRAL  Take 10 mg by mouth every morning.     aspirin EC 81 MG tablet  Take 81 mg by mouth daily.     BESIVANCE 0.6 % Susp  Generic drug:  Besifloxacin HCl  Place 1 drop into the right eye See admin instructions. Uses for 2 days after each monthly ocular injection.  Next injection due 03/12/2013.     celecoxib 100 MG capsule  Commonly known as:  CELEBREX  Take 1 capsule (100 mg total) by mouth 2 (two) times daily.     diphenoxylate-atropine 2.5-0.025 MG per tablet  Commonly known as:  LOMOTIL  Take 1 tablet by mouth 4 (four) times daily as needed for diarrhea or loose stools.     dorzolamide 2 % ophthalmic solution  Commonly known as:  TRUSOPT  Place 1 drop into both eyes 3 (three) times daily.     enoxaparin 30 MG/0.3ML injection  Commonly known as:  LOVENOX  Inject 0.3 mLs (30 mg total) into the skin every 12 (twelve) hours.     esomeprazole 40 MG capsule  Commonly known as:  NEXIUM  Take 40 mg by mouth daily before breakfast.     fexofenadine 180 MG tablet  Commonly known as:  ALLEGRA  Take 180 mg by mouth daily as needed (for allergies).     Fluticasone-Salmeterol 500-50 MCG/DOSE Aepb  Commonly known as:  ADVAIR DISKUS  Inhale 1 puff into the lungs 2 (two) times daily.     Ginkgo Biloba 40 MG Tabs  Take 1 tablet by mouth 2 (two) times daily.     hyoscyamine 0.125 MG SL tablet  Commonly known as:  LEVSIN SL  Place 0.125 mg under the tongue every 2 (two) hours as needed (for gas and cramps).     magnesium oxide 400 MG tablet  Commonly known as:  MAG-OX  Take 400 mg by mouth daily.     metoprolol succinate 50 MG 24 hr tablet  Commonly known as:  TOPROL-XL  Take 75 mg by mouth every morning. Take with or immediately following a meal.     oxybutynin 5 MG 24 hr tablet  Commonly known as:  DITROPAN-XL  Take 5 mg by mouth daily.      oxyCODONE-acetaminophen 10-325 MG per tablet  Commonly known as:  PERCOCET  Take 1-2 tablets by mouth every 6 (six) hours as needed  for pain. MAXIMUM TOTAL ACETAMINOPHEN DOSE IS 4000 MG PER DAY     potassium chloride SA 20 MEQ tablet  Commonly known as:  K-DUR,KLOR-CON  Take 20 mEq by mouth daily.     PRESERVISION AREDS 2 PO  Take 1 tablet by mouth 2 (two) times daily.     quiNINE 324 MG capsule  Commonly known as:  QUALAQUIN  Take 324 mg by mouth 2 (two) times daily.     sennosides-docusate sodium 8.6-50 MG tablet  Commonly known as:  SENOKOT-S  Take 2 tablets by mouth daily.     sildenafil 100 MG tablet  Commonly known as:  VIAGRA  Take 100 mg by mouth daily as needed for erectile dysfunction.     simvastatin 20 MG tablet  Commonly known as:  ZOCOR  Take 20 mg by mouth every evening.     temazepam 30 MG capsule  Commonly known as:  RESTORIL  Take 30 mg by mouth at bedtime.     triamcinolone cream 0.1 %  Commonly known as:  KENALOG  Apply topically 2 (two) times daily.        Diagnostic Studies: Dg Knee Right Port  03/22/2013   CLINICAL DATA:  The osteoarthritis.  EXAM: PORTABLE RIGHT KNEE - 1-2 VIEW  COMPARISON:  None.  FINDINGS: The patient has undergone a right total knee prosthesis insertion. The components appear in good position. No fractures.  IMPRESSION: Satisfactory appearance of the left knee after total knee prosthesis insertion.   Electronically Signed   By: Geanie Cooley   On: 03/22/2013 19:08    Disposition: 03-Skilled Nursing Facility  Discharge Orders   Future Appointments Provider Department Dept Phone   04/12/2013 1:30 PM Sherrie George, MD [facility] [phone]   04/22/2013 10:00 AM Dava Najjar Idelle Jo [facility] [phone]   04/22/2013 10:30 AM Benjiman Core, MD [facility] [phone]   Future Orders Complete By Expires   Call MD / Call 911  As  directed    Comments:     If you experience chest pain or shortness of breath, CALL 911 and be transported to the hospital emergency room.  If you develope a fever above 101 F, pus (white drainage) or increased drainage or redness at the wound, or calf pain, call your surgeon's office.   Change dressing  As directed    Comments:     Change dressing in three days, then change the dressing daily with sterile 4 x 4 inch gauze dressing.  You may clean the incision with alcohol prior to redressing.   Constipation Prevention  As directed    Comments:     Drink plenty of fluids.  Prune juice may be helpful.  You may use a stool softener, such as Colace (over the counter) 100 mg twice a day.  Use MiraLax (over the counter) for constipation as needed.   CPM  As directed    Comments:     Continuous passive motion machine (CPM):      Use the CPM from 0 to 60 for 6 hours per day.      You may increase by 10 degrees per day.  You may break it up into 2 or 3 sessions per day.      Use CPM for 2 weeks or until you are told to stop.   Diet general  As directed    Discharge instructions  As directed  Comments:     Change dressing in 3 days and reapply fresh dressing, unless you have a splint (half cast).  If you have a splint/cast, just leave in place until your follow-up appointment.    Keep wounds dry for 3 weeks.  Leave steri-strips in place on skin.  Do not apply lotion or anything to the wound.   Do not put a pillow under the knee. Place it under the heel.  As directed    Nursing communication  As directed    Comments:     1.5L fluid restriction for 3 days, and he needs a bmp done in 48 hours and results sent to Dr. Arthur Holms and myself.   TED hose  As directed    Comments:     Use stockings (TED hose) for 2 weeks on both leg(s).  You may remove them at night for sleeping.   Weight bearing as tolerated  As directed       Follow-up Information   Follow up with Eulas Post, MD. Schedule an  appointment as soon as possible for a visit in 2 weeks.   Specialty:  Orthopedic Surgery   Contact information:   [address] [place] [address] [phone]        Signed: Eulas Post 03/26/2013, 11:35 AM

## 2013-03-25 NOTE — Progress Notes (Signed)
Orthopedic Tech Progress Note Patient Details:  [name] [birth year] [id number] Spoke with patient at 1500. Patient stated that he had just worked with PT and got in chair. Patient does not want to get back in bed at this time. CPM not applied.  Patient ID: Jack [name], male   DOB: 1929/02/17, 79 y.o.   MRN: [id number]   Orie Rout 03/25/2013, 3:11 PM

## 2013-03-25 NOTE — Progress Notes (Signed)
Patient ID: Jack [name], male   DOB: 02-Aug-1928, 79 y.o.   MRN: [id number]     Subjective:  Patient reports pain as mild.  Patient states that his bowels are becoming more active. Still having some mild nausea.  Objective:   VITALS:   Filed Vitals:   03/24/13 0539 03/24/13 1349 03/24/13 2220 03/25/13 0458  BP: 128/58 123/51 134/61 130/62  Pulse: 77 70 75 70  Temp: 98.3 F (36.8 C) 97.4 F (36.3 C) 98.9 F (37.2 C) 99 F (37.2 C)  TempSrc:   Oral Oral  Resp: 18 18 18 18   Height:      Weight:      SpO2: 98% 97% 100% 100%    ABD soft Sensation intact distally Dorsiflexion/Plantar flexion intact Incision: dressing C/D/I and no drainage   Lab Results  Component Value Date   WBC 7.7 03/25/2013   HGB 9.9* 03/25/2013   HCT 28.8* 03/25/2013   MCV 90.0 03/25/2013   PLT 141* 03/25/2013     Assessment/Plan: 3 Days Post-Op   Principal Problem:   Osteoarthritis of right knee Active Problems:   Ileus, post op TKA   Up with therapy Plan for discharge tomorrow Continue clear liquid diet Continue bowel regimen ABLA continue to monitor   Torrie Mayers, BRANDON 03/25/2013, 7:06 AM   Teryl Lucy, MD Cell [phone]

## 2013-03-25 NOTE — Progress Notes (Signed)
Orthopedic Tech Progress Note Patient Details:  Jack [name] [birth year] [id number] On cpm at 8:10 pm RLE 0-60 Patient ID: Jack [name], male   DOB: January 03, 1929, 79 y.o.   MRN: [id number]   Jack [name] 03/25/2013, 8:08 PM

## 2013-03-26 NOTE — Progress Notes (Signed)
Patient ID: Jack [name], male   DOB: 04-Oct-1928, 79 y.o.   MRN: [id number]     Subjective:  Patient reports pain as mild.  Patient states that he has no nausea and is able to eat but has not had a bowel movement.  He is passing gas.  Objective:   VITALS:   Filed Vitals:   03/25/13 1600 03/25/13 2054 03/25/13 2100 03/26/13 0541  BP:  110/54  130/58  Pulse:  79  79  Temp:  98.5 F (36.9 C)  97.9 F (36.6 C)  TempSrc:  Oral  Oral  Resp: 16 16  16   Height:      Weight:      SpO2:  95% 96% 95%    Sensation intact distally Dorsiflexion/Plantar flexion intact Incision: dressing C/D/I and no drainage Abdomen distended but non tender Mild pain in the knee  Positive bm this am  Lab Results  Component Value Date   WBC 7.7 03/25/2013   HGB 9.9* 03/25/2013   HCT 28.8* 03/25/2013   MCV 90.0 03/25/2013   PLT 141* 03/25/2013     Assessment/Plan: 4 Days Post-Op   Principal Problem:   Osteoarthritis of right knee Active Problems:   Ileus, post op TKA  Hyponatremia, observe, fluid restriction  Advance diet Up with therapy Plan for SNF today, patient wants to go, and has had bm, and will recheck labs in 2 days for hyponatremia Continue bowel regimen  ABLA stable   Haskel Khan 03/26/2013, 7:42 AM   Teryl Lucy, MD Cell [phone]

## 2013-03-26 NOTE — Progress Notes (Signed)
Social note: Patient with constipation - KUB w/o obstruction, maybe early ileus. Labs with mild hyponatremia @129  - recommend mild fluid restriction - 1.5 liters/day w/ f/u Bmet 24-48 hours. Will need to see patient within 7 days after d/c from SNF

## 2013-03-26 NOTE — Discharge Planning (Signed)
Report called to Coranda at [facility].

## 2013-03-30 ENCOUNTER — Non-Acute Institutional Stay (SKILLED_NURSING_FACILITY): Payer: Medicare Other | Admitting: Nurse Practitioner

## 2013-03-30 ENCOUNTER — Encounter: Payer: Self-pay | Admitting: Nurse Practitioner

## 2013-03-30 DIAGNOSIS — K7460 Unspecified cirrhosis of liver: Secondary | ICD-10-CM

## 2013-03-30 DIAGNOSIS — I10 Essential (primary) hypertension: Secondary | ICD-10-CM

## 2013-03-30 DIAGNOSIS — J438 Other emphysema: Secondary | ICD-10-CM

## 2013-03-30 DIAGNOSIS — R609 Edema, unspecified: Secondary | ICD-10-CM

## 2013-03-30 DIAGNOSIS — Z96659 Presence of unspecified artificial knee joint: Secondary | ICD-10-CM

## 2013-04-02 ENCOUNTER — Telehealth: Payer: Self-pay | Admitting: Internal Medicine

## 2013-04-02 NOTE — Telephone Encounter (Signed)
Patient Information:  Caller Name: Jamie  Phone: [phone]  Patient: [name] J  Gender: Male  DOB: 1929/06/03  Age: 79 Years  PCP: Illene Regulus (Adults only)  Office Follow Up:  Does the office need to follow up with this patient?: Yes  Instructions For The Office: Requests MD on call opinion if must be seen in ED now for black diarrhea stools on Lovenox status post total knee replacement.  RN Note:  On Lovenox. No abdominal pain.  Diarrhea stools are black since began. Does not want to go to ED because he is in a rehab facility; prefers to get Dr Debby Bud recommendation. Advised Dr Debby Bud is out of the office until 04/05/13. Will forward to another MD for opinion and call back.  Symptoms  Reason For Call & Symptoms: Diarrhea with 6-7 stools daily.  Diarrhea began after total knee surgery.  Remains in rehab facility.  Reviewed Health History In EMR: Yes  Reviewed Medications In EMR: Yes  Reviewed Allergies In EMR: Yes  Reviewed Surgeries / Procedures: Yes  Date of Onset of Symptoms: 03/27/2013  Treatments Tried: Immodium  Treatments Tried Worked: Yes  Guideline(s) Used:  Diarrhea  Disposition Per Guideline:   Go to ED Now (or to Office with PCP Approval)  Reason For Disposition Reached:   Black bowel movements  Advice Given:  N/A  Patient Refused Recommendation:  Patient Will Follow Up With Office Later  Requests MD on call opinion if must be seen in ED now.

## 2013-04-05 NOTE — Telephone Encounter (Signed)
Called Mr. Odis Luster. He is at [facility]. Advised he have hemocult stool check - call if positive for blood.

## 2013-04-05 NOTE — Telephone Encounter (Signed)
Patient states he is slight SOB, stools are not as bad. Two a day and are slightly dark.

## 2013-04-05 NOTE — Telephone Encounter (Signed)
Please call [facility] and talk with Jack [name]: still having black stools, feeling bad, weak, short of breath. Has facility Dr. Candis Shine notified.

## 2013-04-08 ENCOUNTER — Non-Acute Institutional Stay (SKILLED_NURSING_FACILITY): Payer: Medicare Other | Admitting: Nurse Practitioner

## 2013-04-08 DIAGNOSIS — Z96659 Presence of unspecified artificial knee joint: Secondary | ICD-10-CM

## 2013-04-08 DIAGNOSIS — J438 Other emphysema: Secondary | ICD-10-CM

## 2013-04-08 DIAGNOSIS — R609 Edema, unspecified: Secondary | ICD-10-CM

## 2013-04-08 DIAGNOSIS — I10 Essential (primary) hypertension: Secondary | ICD-10-CM

## 2013-04-09 ENCOUNTER — Telehealth: Payer: Self-pay

## 2013-04-09 ENCOUNTER — Ambulatory Visit (INDEPENDENT_AMBULATORY_CARE_PROVIDER_SITE_OTHER): Payer: Medicare Other | Admitting: Internal Medicine

## 2013-04-09 ENCOUNTER — Other Ambulatory Visit (INDEPENDENT_AMBULATORY_CARE_PROVIDER_SITE_OTHER): Payer: Medicare Other

## 2013-04-09 ENCOUNTER — Encounter: Payer: Self-pay | Admitting: Internal Medicine

## 2013-04-09 ENCOUNTER — Other Ambulatory Visit: Payer: Self-pay | Admitting: Internal Medicine

## 2013-04-09 VITALS — BP 120/60 | HR 83 | Temp 96.8°F | Wt 192.8 lb

## 2013-04-09 DIAGNOSIS — K921 Melena: Secondary | ICD-10-CM

## 2013-04-09 DIAGNOSIS — R197 Diarrhea, unspecified: Secondary | ICD-10-CM

## 2013-04-09 DIAGNOSIS — R195 Other fecal abnormalities: Secondary | ICD-10-CM

## 2013-04-09 DIAGNOSIS — R350 Frequency of micturition: Secondary | ICD-10-CM

## 2013-04-09 DIAGNOSIS — IMO0001 Reserved for inherently not codable concepts without codable children: Secondary | ICD-10-CM

## 2013-04-09 DIAGNOSIS — J438 Other emphysema: Secondary | ICD-10-CM

## 2013-04-09 LAB — CBC WITH DIFFERENTIAL/PLATELET
Eosinophils Absolute: 0.2 10*3/uL (ref 0.0–0.7)
Eosinophils Relative: 2.1 % (ref 0.0–5.0)
MCHC: 33.3 g/dL (ref 30.0–36.0)
MCV: 89.7 fl (ref 78.0–100.0)
Monocytes Absolute: 0.5 10*3/uL (ref 0.1–1.0)
Neutrophils Relative %: 70.9 % (ref 43.0–77.0)
Platelets: 274 10*3/uL (ref 150.0–400.0)
WBC: 7 10*3/uL (ref 4.5–10.5)

## 2013-04-09 LAB — URINALYSIS, ROUTINE W REFLEX MICROSCOPIC
Bilirubin Urine: NEGATIVE
Hgb urine dipstick: NEGATIVE
Nitrite: NEGATIVE
Total Protein, Urine: NEGATIVE
Urine Glucose: NEGATIVE
Urobilinogen, UA: 0.2 (ref 0.0–1.0)

## 2013-04-09 LAB — COMPREHENSIVE METABOLIC PANEL
AST: 24 U/L (ref 0–37)
Albumin: 3.4 g/dL — ABNORMAL LOW (ref 3.5–5.2)
Alkaline Phosphatase: 62 U/L (ref 39–117)
CO2: 29 mEq/L (ref 19–32)
Glucose, Bld: 141 mg/dL — ABNORMAL HIGH (ref 70–99)
Potassium: 4.2 mEq/L (ref 3.5–5.1)
Sodium: 138 mEq/L (ref 135–145)
Total Protein: 6.5 g/dL (ref 6.0–8.3)

## 2013-04-09 NOTE — Telephone Encounter (Signed)
Phone call from patients daughter [phone] Scharlene Corn. She states Mr. Edmundson did not get any sleep last night, he was up all night with diarrhea. His legs and feet are swollen. Some nausea. Also has a rash in his groin area. She has concerns of the rehab center giving him Aspirin while he was on Lovenox. Please advise.

## 2013-04-09 NOTE — Progress Notes (Signed)
Subjective:    Patient ID: Jack [name], male    DOB: [birth year], 79 y.o.   MRN: [id number]  HPI Mr. Navarro was recently hospitalized for right TKR. He has been at [facility] . He has had dark, loose stools with a melanotic odor.He is having 2-3 loose stools.  He has had nausea, borborygmi, gnawing gastric discomfort. Having intermittent chills. Urinary frequency over last 12 hours. Rash in the groin, recurrent-intermittant - sounds like fungal rash.   He does report that he has been having increased wheezing, usually after PT and has been using albuterol MDI 3 times a day.   Past Medical History  Diagnosis Date  . GERD (gastroesophageal reflux disease)   . Osteoarthritis   . Alcohol abuse     in recovery 2006  . 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 wine only  . Irritable bowel   . Hyperlipidemia   . Insomnia   . PAC (premature atrial contraction)     stable (ruled out for a fib)  . Prostatitis     chronic bacterial  . Pseudogout   . Sinus problem     chronic  . Cellulitis     2nd to T. Pedis(not at present)  . Hearing loss 03-10-12    bilateral hearing aids  . Complication of anesthesia 03-10-12    after shoulder  surgery -difficulty awakening,breathing problems  . Emphysema 03-10-12    tx. inhalers, uses steam room frequently  . Cramping of feet 03-10-12    cramping of both legs and hands occ.  . Non Hodgkin's lymphoma 03-10-12     '07-1 yr. in remission(Shadad)-not seeing now  . Osteoarthritis of left knee 03/17/2012  . Glaucoma   . Macular degeneration   . Hypertension   . Shortness of breath    Past Surgical History  Procedure Laterality Date  . Esophagogastroduodenoscopy  01-27-02  . Inguinal hernia repair  1979  . Torn biceps repair  03-10-12    Left rotator cuff repair  . Hernia repair    . Cataract surgery  03-10-12    03-04-12(right)/ (03-11-12-left)  . Total knee arthroplasty  03/17/2012    Procedure: TOTAL KNEE  ARTHROPLASTY;  Surgeon: Eulas Post, MD;  Location: [facility];  Service: Orthopedics;  Laterality: Left;  Marland Kitchen Eye surgery  2014    cataract extraction with IOL both eyes staged  . Colonoscopy    . Rotator cuff repair Left   . Total knee arthroplasty Right 03/22/2013    Procedure: TOTAL KNEE ARTHROPLASTY;  Surgeon: Eulas Post, MD;  Location: [facility];  Service: Orthopedics;  Laterality: Right;  righ total knee arthroplasty   Family History  Problem Relation Age of Onset  . Coronary artery disease Father   . Heart attack Father   . Cancer Brother     colon  . Other Other     TB   History   Social History  . Marital Status: Widowed    Spouse Name: N/A    Number of Children: 3  . Years of Education: 18   Occupational History  . newspaper Programmer, multimedia     [employer]   Social History Main Topics  . Smoking status: Former Smoker    Types: Cigarettes    Quit date: 06/24/1978  . Smokeless tobacco: Never Used  . Alcohol Use: 6.7 oz/week    7 Glasses of wine, 5 Drinks containing 0.5 oz of alcohol per week  Comment: Currently only drinks wine - in moderation. He is cutting back-past hx. ETOH abuse  . Drug Use: No  . Sexual Activity: Yes   Other Topics Concern  . Not on file   Social History Narrative   [employer] [place]. married '56- widowed '07. 1 son, 2 daughters, 4 grandchildren   work: former Counsellor; very active in civic affairs. lives alone: completed  [place] continues to [place] improvement ('12), keeps up his [place] house; takes his extended family on great trips - [place] and [place] July '12. Socially active.  Active in AA      End of Life issues: He does want CPR; no prolonged intubation; no futile or heroic measure to maintain him if the quality of life isn't good. HCPOA  Son - Kadden Osterhout ([phone].     Current Outpatient Prescriptions on File Prior to Visit  Medication Sig Dispense Refill  . albuterol  (PROVENTIL HFA;VENTOLIN HFA) 108 (90 BASE) MCG/ACT inhaler Inhale 2 puffs into the lungs every 6 (six) hours as needed for shortness of breath.       Marland Kitchen albuterol (PROVENTIL HFA;VENTOLIN HFA) 108 (90 BASE) MCG/ACT inhaler Inhale 2 puffs into the lungs every 6 (six) hours as needed for wheezing.      Marland Kitchen alfuzosin (UROXATRAL) 10 MG 24 hr tablet Take 10 mg by mouth every morning.       Marland Kitchen aspirin EC 81 MG tablet Take 81 mg by mouth daily.      Marland Kitchen Besifloxacin HCl (BESIVANCE) 0.6 % SUSP Place 1 drop into the right eye See admin instructions. Uses for 2 days after each monthly ocular injection.  Next injection due 03/12/2013.      . celecoxib (CELEBREX) 100 MG capsule Take 1 capsule (100 mg total) by mouth 2 (two) times daily.  60 capsule  0  . diphenoxylate-atropine (LOMOTIL) 2.5-0.025 MG per tablet Take 1 tablet by mouth 4 (four) times daily as needed for diarrhea or loose stools.      . dorzolamide (TRUSOPT) 2 % ophthalmic solution Place 1 drop into both eyes 3 (three) times daily.       Marland Kitchen enoxaparin (LOVENOX) 30 MG/0.3ML injection Inject 0.3 mLs (30 mg total) into the skin every 12 (twelve) hours.  42 Syringe  0  . esomeprazole (NEXIUM) 40 MG capsule Take 40 mg by mouth daily before breakfast.      . fexofenadine (ALLEGRA) 180 MG tablet Take 180 mg by mouth daily as needed (for allergies).       . Fluticasone-Salmeterol (ADVAIR DISKUS) 500-50 MCG/DOSE AEPB Inhale 1 puff into the lungs 2 (two) times daily.  60 each  11  . Ginkgo Biloba 40 MG TABS Take 1 tablet by mouth 2 (two) times daily.       . hyoscyamine (LEVSIN SL) 0.125 MG SL tablet Place 0.125 mg under the tongue every 2 (two) hours as needed (for gas and cramps).      . magnesium oxide (MAG-OX) 400 MG tablet Take 400 mg by mouth daily.      . metoprolol succinate (TOPROL-XL) 50 MG 24 hr tablet Take 75 mg by mouth every morning. Take with or immediately following a meal.      . Multiple Vitamins-Minerals (PRESERVISION AREDS 2 PO) Take 1 tablet by  mouth 2 (two) times daily.      Marland Kitchen oxybutynin (DITROPAN-XL) 5 MG 24 hr tablet Take 5 mg by mouth daily.      Marland Kitchen  oxyCODONE-acetaminophen (PERCOCET) 10-325 MG per tablet Take 1-2 tablets by mouth every 6 (six) hours as needed for pain. MAXIMUM TOTAL ACETAMINOPHEN DOSE IS 4000 MG PER DAY  75 tablet  0  . potassium chloride SA (K-DUR,KLOR-CON) 20 MEQ tablet Take 20 mEq by mouth daily.      . quiNINE (QUALAQUIN) 324 MG capsule Take 324 mg by mouth 2 (two) times daily.      . sennosides-docusate sodium (SENOKOT-S) 8.6-50 MG tablet Take 2 tablets by mouth daily.  30 tablet  1  . sildenafil (VIAGRA) 100 MG tablet Take 100 mg by mouth daily as needed for erectile dysfunction.      . simvastatin (ZOCOR) 20 MG tablet Take 20 mg by mouth every evening.      . temazepam (RESTORIL) 30 MG capsule Take 30 mg by mouth at bedtime.      . triamcinolone cream (KENALOG) 0.1 % Apply topically 2 (two) times daily.       No current facility-administered medications on file prior to visit.       Review of Systems Constitutional:  Negative for fever, chills, activity change and unexpected weight change.  HEENT:  Negative for hearing loss, ear pain, congestion, neck stiffness and postnasal drip. Negative for sore throat or swallowing problems. Negative for dental complaints.   Eyes: Negative for vision loss or change in visual acuity.  Respiratory: Negative for chest tightness and wheezing. Negative for DOE.   Cardiovascular: Negative for chest pain or palpitations. No decreased exercise tolerance Gastrointestinal: No change in bowel habit. No bloating or gas. No reflux or indigestion Genitourinary: Negative for urgency, frequency, flank pain and difficulty urinating.  Musculoskeletal: Negative for myalgias, back pain, arthralgias and gait problem.  Neurological: Negative for dizziness, tremors, weakness and headaches.  Hematological: Negative for adenopathy.  Psychiatric/Behavioral: Negative for behavioral problems  and dysphoric mood.       Objective:   Physical Exam Filed Vitals:   04/09/13 1037  BP: 120/60  Pulse: 83  Temp: 96.8 F (36 C)   Gen'l- older man in no acute distress HEENT - normal Cor - 2+ radial, RRR Pulm - shallow ispirations, no rales or wheeze, no prolonged expiratory phase Abd - BS+ x 4, no guarding or rebound, mild-moderate tenderness RUQ (duodenum) and mild epigastric tenderness Rectal - NST, generous prostate, stool heme negative       Assessment & Plan:  1.  GI - stool is negative for blood at today's exam. You do have some tenderness on exam at the area of the duodenum. May have some mild degree of gastritis or duodenitis  Plan Hold celebrex for a week or so.  Increase nexium to AM and PM for 10 days, then resume once every AM  Remember that iron will darken the stools.  No clinical evidence of C. Diff today - if you have fever, lower abdominal pain or mucus in the stool let me know  2. Urinary frequency - with sudden on-set there may be a urinary track infection with frequency, discomfort, chills, etc.  Plan  stop by the lab for a U/A - recommendations to follow  Addendum- U/A moderately positive. Plan - septra DS bid x 5 days - Rx to pharmacy

## 2013-04-09 NOTE — Patient Instructions (Signed)
Glad you are home and that you are doing well with the new knee - Dr. Dion Saucier does good workl  1. GI - stool is negative for blood at today's exam. You do have some tenderness on exam at the area of the duodenum. May have some mild degree of gastritis or duodenitis  Plan Hold celebrex for a week or so.  Increase nexium to AM and PM for 10 days, then resume once every AM  Remember that iron will darken the stools.  No clinical evidence of C. Diff today - if you have fever, lower abdominal pain or mucus in the stool let me know  2. Urinary frequency - with sudden on-set there may be a urinary track infection with frequency, discomfort, chills, etc.  Plan  stop by the lab for a U/A - recommendations to follow  3. Pulmonary - your PFTs revealed moderate COPD - not very responsive to bronchodilators; mild decrease in diffusion capacity - emphysema  Plan Continue the Advair  Use albuterol inhaler as needed for WHEEZING. If you need to use it more than 3 times a day - call.

## 2013-04-11 MED ORDER — SULFAMETHOXAZOLE-TMP DS 800-160 MG PO TABS: 1.0000 | ORAL_TABLET | Freq: Two times a day (BID) | ORAL | Status: DC

## 2013-04-11 NOTE — Assessment & Plan Note (Signed)
Pulmonary - your PFTs revealed moderate COPD - not very responsive to bronchodilators; mild decrease in diffusion capacity - emphysema  Plan Continue the Advair  Use albuterol inhaler as needed for WHEEZING. If you need to use it more than 3 times a day - call

## 2013-04-12 ENCOUNTER — Telehealth: Payer: Self-pay

## 2013-04-12 ENCOUNTER — Encounter (INDEPENDENT_AMBULATORY_CARE_PROVIDER_SITE_OTHER): Payer: Medicare Other | Admitting: Ophthalmology

## 2013-04-12 DIAGNOSIS — H3530 Unspecified macular degeneration: Secondary | ICD-10-CM

## 2013-04-12 DIAGNOSIS — H35039 Hypertensive retinopathy, unspecified eye: Secondary | ICD-10-CM

## 2013-04-12 DIAGNOSIS — H43819 Vitreous degeneration, unspecified eye: Secondary | ICD-10-CM

## 2013-04-12 DIAGNOSIS — H353290 Exudative age-related macular degeneration, unspecified eye, stage unspecified: Secondary | ICD-10-CM

## 2013-04-12 DIAGNOSIS — I10 Essential (primary) hypertension: Secondary | ICD-10-CM

## 2013-04-12 NOTE — Telephone Encounter (Signed)
Patient has been notified of results and will pick up prescription.

## 2013-04-12 NOTE — Telephone Encounter (Signed)
Message copied by Noreene Larsson on Mon Apr 12, 2013  8:21 AM ------      Message from: Jacques Navy      Created: Sun Apr 11, 2013 10:56 PM       Call patient: U/A weakly positive      Rx to pharmacy: septra DS bid x 5 days ------

## 2013-04-18 ENCOUNTER — Other Ambulatory Visit: Payer: Self-pay | Admitting: Internal Medicine

## 2013-04-19 ENCOUNTER — Other Ambulatory Visit: Payer: Self-pay

## 2013-04-19 LAB — HEMOCCULT SLIDES (X 3 CARDS)
OCCULT 2: NEGATIVE
OCCULT 3: NEGATIVE

## 2013-04-19 MED ORDER — SULFAMETHOXAZOLE-TMP DS 800-160 MG PO TABS: 1.0000 | ORAL_TABLET | Freq: Two times a day (BID) | ORAL | Status: DC

## 2013-04-20 NOTE — Progress Notes (Signed)
  March 30, 2013  History of present illness: The patient is status post total right total knee replacement.  The patient was at a place for rehabilitation, mobility, strength training, and ADL management.  Chief complaint: The patient reports some diarrhea, apparently now resolved. No other verbalize concerns.   Physical examination:  Pupils are equally round and reactive to light, question of some exophthalmus. Extraocular movements are intact.  Conjunctiva are pale pink. No true icteric findings. Tympanic membranes unremarkable. Oropharynx, teeth in excellent repair, no gross oral lesions. No palpable cervical adenopathy, no apparent carotid bruits. Heart sounds single with assistance but certainly regular rate and rhythm. Bilateral breath sounds are clear but somewhat diminished. Abdomen positive bowel sounds otherwise soft and nontender. Examination of the right upper quadrant which showed that a firm liver edge is easily palpable with deep inspiration. Pitting edema bilaterally 2-3+, more significant on the right, operative side. Positive for hemosiderin staining bilaterally. A dry dressing continues over the right knee. Wound care is managed by the nurse. He certainly has had no erythema of the surrounding area. Posterior tibial pulses palpable bilaterally.  Assessment/plan: Concerned for the pitting edema. The patient consistently sits with his legs hanging down. Have discussed issue of gravity. Have discussed with patient a trial of Lasix may be necessary to address the edema but staying with his legs in a dependent position would not prevent reaccumulation of any edema. Discuss with patient option of compression wrapping versus utilization of his TED hose as well. Related to patient's history of alcohol abuse and ongoing alcohol use, we'll check his liver panel in the morning. We'll continue to follow this patient over time.  This encounter was created in error - please  disregard. This encounter was created in error - please disregard.

## 2013-04-22 ENCOUNTER — Other Ambulatory Visit (HOSPITAL_BASED_OUTPATIENT_CLINIC_OR_DEPARTMENT_OTHER): Payer: Medicare Other | Admitting: Lab

## 2013-04-22 ENCOUNTER — Ambulatory Visit (HOSPITAL_BASED_OUTPATIENT_CLINIC_OR_DEPARTMENT_OTHER): Payer: Medicare Other | Admitting: Oncology

## 2013-04-22 ENCOUNTER — Telehealth: Payer: Self-pay | Admitting: Oncology

## 2013-04-22 VITALS — BP 109/55 | HR 87 | Temp 98.1°F | Resp 17 | Ht 65.0 in | Wt 189.9 lb

## 2013-04-22 DIAGNOSIS — C8589 Other specified types of non-Hodgkin lymphoma, extranodal and solid organ sites: Secondary | ICD-10-CM

## 2013-04-22 DIAGNOSIS — C8299 Follicular lymphoma, unspecified, extranodal and solid organ sites: Secondary | ICD-10-CM

## 2013-04-22 LAB — COMPREHENSIVE METABOLIC PANEL (CC13)
ALT: 11 U/L (ref 0–55)
Albumin: 3.1 g/dL — ABNORMAL LOW (ref 3.5–5.0)
Anion Gap: 11 mEq/L (ref 3–11)
BUN: 13 mg/dL (ref 7.0–26.0)
CO2: 24 mEq/L (ref 22–29)
Calcium: 9.9 mg/dL (ref 8.4–10.4)
Chloride: 97 mEq/L — ABNORMAL LOW (ref 98–109)
Creatinine: 0.9 mg/dL (ref 0.7–1.3)
Sodium: 132 mEq/L — ABNORMAL LOW (ref 136–145)
Total Bilirubin: 0.65 mg/dL (ref 0.20–1.20)

## 2013-04-22 LAB — CBC WITH DIFFERENTIAL/PLATELET
BASO%: 0.8 % (ref 0.0–2.0)
EOS%: 1.6 % (ref 0.0–7.0)
HCT: 30 % — ABNORMAL LOW (ref 38.4–49.9)
HGB: 10.1 g/dL — ABNORMAL LOW (ref 13.0–17.1)
LYMPH%: 22.1 % (ref 14.0–49.0)
MCH: 29.5 pg (ref 27.2–33.4)
MCHC: 33.5 g/dL (ref 32.0–36.0)
MONO#: 1.4 10*3/uL — ABNORMAL HIGH (ref 0.1–0.9)
NEUT%: 62.9 % (ref 39.0–75.0)
Platelets: 235 10*3/uL (ref 140–400)
WBC: 10.8 10*3/uL — ABNORMAL HIGH (ref 4.0–10.3)

## 2013-04-22 NOTE — Progress Notes (Signed)
Hematology and Oncology Follow Up Visit  Jack [name] [id number] March 03, 1929 79 y.o. 04/22/2013 10:36 AM   Principle Diagnosis: This is a 79 year old gentleman with grade 3 stage II follicular lymphoma diagnosed in 2007.  Prior Therapy: Patient treated with 6 cycles of CHOP with rituximab.  Therapy concluded August 2007.  He had a complete response and had been in remission since that time.  Current therapy: Observation and surveillance.  No evidence of any recurrent disease.  Interim History: Jack [name] presents today for a followup visit.  He has continued to very well without any evidence to suggest recurrent relapsed disease. He is not reporting any dysphagia or odynophagia.  He is not reporting any fevers, did not report any chills, did not report any constitutional symptoms.  He underwent knee replacement operation about a month ago and he is recovering quite nicely. He had a brief stent at the rehabilitation but now he is receiving rehabilitation at home. He is ambulating with the help of a walker and not able to drive yet. He did lose about 5 pounds but his appetite is slightly improving.  Medications: Current Outpatient Prescriptions  Medication Sig Dispense Refill  . albuterol (PROVENTIL HFA;VENTOLIN HFA) 108 (90 BASE) MCG/ACT inhaler Inhale 2 puffs into the lungs every 6 (six) hours as needed for shortness of breath.       Marland Kitchen albuterol (PROVENTIL HFA;VENTOLIN HFA) 108 (90 BASE) MCG/ACT inhaler Inhale 2 puffs into the lungs every 6 (six) hours as needed for wheezing.      Marland Kitchen alfuzosin (UROXATRAL) 10 MG 24 hr tablet Take 10 mg by mouth every morning.       Marland Kitchen aspirin EC 81 MG tablet Take 81 mg by mouth daily.      Marland Kitchen Besifloxacin HCl (BESIVANCE) 0.6 % SUSP Place 1 drop into the right eye See admin instructions. Uses for 2 days after each monthly ocular injection.  Next injection due 03/12/2013.      . celecoxib (CELEBREX) 100 MG capsule Take 1 capsule (100 mg total) by mouth 2 (two) times  daily.  60 capsule  0  . diphenoxylate-atropine (LOMOTIL) 2.5-0.025 MG per tablet Take 1 tablet by mouth 4 (four) times daily as needed for diarrhea or loose stools.      . dorzolamide (TRUSOPT) 2 % ophthalmic solution Place 1 drop into both eyes 3 (three) times daily.       Marland Kitchen enoxaparin (LOVENOX) 30 MG/0.3ML injection Inject 0.3 mLs (30 mg total) into the skin every 12 (twelve) hours.  42 Syringe  0  . esomeprazole (NEXIUM) 40 MG capsule Take 40 mg by mouth daily before breakfast.      . fexofenadine (ALLEGRA) 180 MG tablet Take 180 mg by mouth daily as needed (for allergies).       . Fluticasone-Salmeterol (ADVAIR DISKUS) 500-50 MCG/DOSE AEPB Inhale 1 puff into the lungs 2 (two) times daily.  60 each  11  . Ginkgo Biloba 40 MG TABS Take 1 tablet by mouth 2 (two) times daily.       . hyoscyamine (LEVSIN SL) 0.125 MG SL tablet Place 0.125 mg under the tongue every 2 (two) hours as needed (for gas and cramps).      . magnesium oxide (MAG-OX) 400 MG tablet Take 400 mg by mouth daily.      . metoprolol succinate (TOPROL-XL) 50 MG 24 hr tablet Take 75 mg by mouth every morning. Take with or immediately following a meal.      . Multiple  Vitamins-Minerals (PRESERVISION AREDS 2 PO) Take 1 tablet by mouth 2 (two) times daily.      Marland Kitchen oxybutynin (DITROPAN-XL) 5 MG 24 hr tablet Take 5 mg by mouth daily.      Marland Kitchen oxyCODONE-acetaminophen (PERCOCET) 10-325 MG per tablet Take 1-2 tablets by mouth every 6 (six) hours as needed for pain. MAXIMUM TOTAL ACETAMINOPHEN DOSE IS 4000 MG PER DAY  75 tablet  0  . potassium chloride SA (K-DUR,KLOR-CON) 20 MEQ tablet Take 20 mEq by mouth daily.      . quiNINE (QUALAQUIN) 324 MG capsule Take 324 mg by mouth 2 (two) times daily.      . sennosides-docusate sodium (SENOKOT-S) 8.6-50 MG tablet Take 2 tablets by mouth daily.  30 tablet  1  . sildenafil (VIAGRA) 100 MG tablet Take 100 mg by mouth daily as needed for erectile dysfunction.      . simvastatin (ZOCOR) 20 MG tablet Take  20 mg by mouth every evening.      . sulfamethoxazole-trimethoprim (BACTRIM DS) 800-160 MG per tablet Take 1 tablet by mouth 2 (two) times daily.  10 tablet  0  . temazepam (RESTORIL) 30 MG capsule Take 30 mg by mouth at bedtime.      . triamcinolone cream (KENALOG) 0.1 % Apply topically 2 (two) times daily.       No current facility-administered medications for this visit.    Allergies:  Allergies  Allergen Reactions  . Amoxicillin     REACTION: rash  . Penicillins Rash    Past Medical History, Surgical history, Social history, and Family History were reviewed and updated.  Review of Systems:  Remaining ROS negative. Physical Exam: Blood pressure 109/55, pulse 87, temperature 98.1 F (36.7 C), temperature source Oral, resp. rate 17, height 5\' 5"  (1.651 m), weight 189 lb 14.4 oz (86.138 kg). ECOG: 1  General appearance: alert Head: Normocephalic, without obvious abnormality, atraumatic Neck: no adenopathy, no carotid bruit, no JVD, supple, symmetrical, trachea midline and thyroid not enlarged, symmetric, no tenderness/mass/nodules Lymph nodes: Cervical, supraclavicular, and axillary nodes normal. Heart:regular rate and rhythm, S1, S2 normal, no murmur, click, rub or gallop Lung:chest clear, no wheezing, rales, normal symmetric air entry Abdomin: soft, non-tender, without masses or organomegaly EXT:no erythema, induration, or nodules   Lab Results: Lab Results  Component Value Date   WBC 10.8* 04/22/2013   HGB 10.1* 04/22/2013   HCT 30.0* 04/22/2013   MCV 87.9 04/22/2013   PLT 235 04/22/2013     Chemistry      Component Value Date/Time   NA 138 04/09/2013 1021   NA 138 04/22/2012 1310   K 4.2 04/09/2013 1021   K 4.4 04/22/2012 1310   CL 100 04/09/2013 1021   CL 104 04/22/2012 1310   CO2 29 04/09/2013 1021   CO2 28 04/22/2012 1310   BUN 10 04/09/2013 1021   BUN 12.0 04/22/2012 1310   CREATININE 1.0 04/09/2013 1021   CREATININE 0.8 04/22/2012 1310       Component Value Date/Time   CALCIUM 9.3 04/09/2013 1021   CALCIUM 9.6 04/22/2012 1310   ALKPHOS 62 04/09/2013 1021   ALKPHOS 67 04/22/2012 1310   AST 24 04/09/2013 1021   AST 17 04/22/2012 1310   ALT 13 04/09/2013 1021   ALT 17 04/22/2012 1310   BILITOT 0.4 04/09/2013 1021   BILITOT 0.35 04/22/2012 1310      Impression and Plan:  A pleasant 79 year old gentleman with the following issues:   1. Grade 3 stage  II follicular lymphoma, status post CHOP with rituximab.  Now he is over 5 years out from the conclusion of his chemotherapy.  No evidence to suggest recurrent disease.  At the time being, I would like to keep him on active surveillance. I will continue evaluating him every 12 months and we'll consider when necessary visits in the future. 2. Next followup will be in 03/2014.  Zairah Arista, MD 10/30/201410:36 AM

## 2013-04-22 NOTE — Addendum Note (Signed)
Addended by: Reesa Chew on: 04/22/2013 11:02 AM   Modules accepted: Orders

## 2013-04-22 NOTE — Telephone Encounter (Signed)
gv and printed appt sched and avs for pt for OCT 2015  °

## 2013-04-27 ENCOUNTER — Encounter: Payer: Self-pay | Admitting: Internal Medicine

## 2013-04-27 ENCOUNTER — Observation Stay (HOSPITAL_COMMUNITY): Admission: AD | Admit: 2013-04-27 | Payer: Medicare Other | Source: Ambulatory Visit | Admitting: Internal Medicine

## 2013-04-27 ENCOUNTER — Ambulatory Visit (INDEPENDENT_AMBULATORY_CARE_PROVIDER_SITE_OTHER): Payer: Medicare Other | Admitting: Internal Medicine

## 2013-04-27 ENCOUNTER — Ambulatory Visit (HOSPITAL_COMMUNITY)
Admission: RE | Admit: 2013-04-27 | Discharge: 2013-04-27 | Disposition: A | Discharge status: Home / Self Care | Payer: Medicare Other | Source: Ambulatory Visit | Attending: Internal Medicine | Admitting: Internal Medicine

## 2013-04-27 VITALS — BP 140/60 | HR 91 | Temp 98.0°F | Wt 193.0 lb

## 2013-04-27 DIAGNOSIS — D71 Functional disorders of polymorphonuclear neutrophils: Secondary | ICD-10-CM | POA: Insufficient documentation

## 2013-04-27 DIAGNOSIS — R609 Edema, unspecified: Secondary | ICD-10-CM

## 2013-04-27 DIAGNOSIS — J438 Other emphysema: Secondary | ICD-10-CM | POA: Insufficient documentation

## 2013-04-27 DIAGNOSIS — R600 Localized edema: Secondary | ICD-10-CM

## 2013-04-27 DIAGNOSIS — R791 Abnormal coagulation profile: Secondary | ICD-10-CM | POA: Insufficient documentation

## 2013-04-27 DIAGNOSIS — M1711 Unilateral primary osteoarthritis, right knee: Secondary | ICD-10-CM

## 2013-04-27 DIAGNOSIS — R1032 Left lower quadrant pain: Secondary | ICD-10-CM

## 2013-04-27 DIAGNOSIS — I2510 Atherosclerotic heart disease of native coronary artery without angina pectoris: Secondary | ICD-10-CM | POA: Insufficient documentation

## 2013-04-27 DIAGNOSIS — M1710 Unilateral primary osteoarthritis, unspecified knee: Secondary | ICD-10-CM

## 2013-04-27 DIAGNOSIS — I2699 Other pulmonary embolism without acute cor pulmonale: Secondary | ICD-10-CM

## 2013-04-27 DIAGNOSIS — R0602 Shortness of breath: Secondary | ICD-10-CM | POA: Insufficient documentation

## 2013-04-27 MED ORDER — METRONIDAZOLE 500 MG PO TABS: 500.0000 mg | ORAL_TABLET | Freq: Three times a day (TID) | ORAL | Status: DC

## 2013-04-27 MED ORDER — CIPROFLOXACIN HCL 500 MG PO TABS: 500.0000 mg | ORAL_TABLET | Freq: Two times a day (BID) | ORAL | Status: DC

## 2013-04-27 MED ORDER — IOHEXOL 350 MG/ML SOLN
100.0000 mL | Freq: Once | INTRAVENOUS | Status: AC | PRN
  Administered 2013-04-27: 75 mL via INTRAVENOUS

## 2013-04-27 NOTE — Patient Instructions (Signed)
1. Abdominal pain - concerned for possible diverticulitis - a colon infection with greatest pain in the left lower quadrant. Please do not take Peptobismal. Use a alternative if needed,m e.g mylanta.  Plan Blood tonight  Start antibiotics - cipro 500 mg twice a day; flagyl 500 mg three times a day  2. Leg swelling - unilateral, post-op, with shortness of breath with exertion: most important concern is for any possible blood clot. Plan D-Dimer - blood test that helps identify patients with blood clots.  Recommendations to follow - will call you tonight  3. Red and warm knee - Dr. Dion Saucier has low level of concern since it is painless, BUT he can see you tomorrow if it is a problem.

## 2013-04-27 NOTE — Progress Notes (Signed)
Pre-visit discussion using our clinic review tool. No additional management support is needed unless otherwise documented below in the visit note.  

## 2013-04-27 NOTE — Progress Notes (Signed)
Subjective:    Patient ID: Jack [name], male    DOB: 1928-10-08, 79 y.o.   MRN: [id number]  HPI Jack [name] presents for abdominal discomfort. He describes a sharp pain in the lower abdomen. The pain is constant but will wax and wane - o/o to 8/10. Gets relief with  Oxycodone/APAP. BM makes it better. Eating may make it worse. No epigastic pain. He has been taking probiotic, Peptobismal and metamucil. He is also taking Nexium twice a day.   Leg swelling left leg - since surgery on the right knee. No change overnight - swelling is constant. No pain in the leg or the calve. He has had increased SOB. He has been using albuterol for SOB but has not had wheezing.   C/o dry mouth - on ditropan XL 5 mg daily.   Past Medical History  Diagnosis Date  . GERD (gastroesophageal reflux disease)   . Osteoarthritis   . Alcohol abuse     in recovery 2006  . 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 wine only  . Irritable bowel   . Hyperlipidemia   . Insomnia   . PAC (premature atrial contraction)     stable (ruled out for a fib)  . Prostatitis     chronic bacterial  . Pseudogout   . Sinus problem     chronic  . Cellulitis     2nd to T. Pedis(not at present)  . Hearing loss 03-10-12    bilateral hearing aids  . Complication of anesthesia 03-10-12    after shoulder  surgery -difficulty awakening,breathing problems  . Emphysema 03-10-12    tx. inhalers, uses steam room frequently  . Cramping of feet 03-10-12    cramping of both legs and hands occ.  . Non Hodgkin's lymphoma 03-10-12     '07-1 yr. in remission(Jack [name])-not seeing now  . Osteoarthritis of left knee 03/17/2012  . Glaucoma   . Macular degeneration   . Hypertension   . Shortness of breath    Past Surgical History  Procedure Laterality Date  . Esophagogastroduodenoscopy  01-27-02  . Inguinal hernia repair  1979  . Torn biceps repair  03-10-12    Left rotator cuff repair  . Hernia repair    .  Cataract surgery  03-10-12    03-04-12(right)/ (03-11-12-left)  . Total knee arthroplasty  03/17/2012    Procedure: TOTAL KNEE ARTHROPLASTY;  Surgeon: Jack Post, MD;  Location: [facility];  Service: Orthopedics;  Laterality: Left;  Marland Kitchen Eye surgery  2014    cataract extraction with IOL both eyes staged  . Colonoscopy    . Rotator cuff repair Left   . Total knee arthroplasty Right 03/22/2013    Procedure: TOTAL KNEE ARTHROPLASTY;  Surgeon: Jack Post, MD;  Location: [facility];  Service: Orthopedics;  Laterality: Right;  righ total knee arthroplasty   Family History  Problem Relation Age of Onset  . Coronary artery disease Father   . Heart attack Father   . Cancer Brother     colon  . Other Other     TB   History   Social History  . Marital Status: Widowed    Spouse Name: N/A    Number of Children: 3  . Years of Education: 18   Occupational History  . newspaper Programmer, multimedia     [employer]   Social History Main Topics  . Smoking status: Former Smoker    Types: Cigarettes  Quit date: 06/24/1978  . Smokeless tobacco: Never Used  . Alcohol Use: 6.7 oz/week    7 Glasses of wine, 5 Drinks containing 0.5 oz of alcohol per week     Comment: Currently only drinks wine - in moderation. He is cutting back-past hx. ETOH abuse  . Drug Use: No  . Sexual Activity: Yes   Other Topics Concern  . Not on file   Social History Narrative   [employer] [place]. married '56- widowed '07. 1 son, 2 daughters, 4 grandchildren   work: former Counsellor; very active in civic affairs. lives alone: completed  renov[place] family farm[place]home in [place] but continues to make improvement ('12), keeps up his beach house; takes his extended family on great trips - [place] and [place] July '12. Socially active.  Active in AA      End of Life issues: He does want CPR; no prolonged intubation; no futile or heroic measure to maintain him if the quality of life isn't good. HCPOA  Son - Jack [name] ([phone].     Current Outpatient Prescriptions on File Prior to Visit  Medication Sig Dispense Refill  . albuterol (PROVENTIL HFA;VENTOLIN HFA) 108 (90 BASE) MCG/ACT inhaler Inhale 2 puffs into the lungs every 6 (six) hours as needed for shortness of breath.       Marland Kitchen albuterol (PROVENTIL HFA;VENTOLIN HFA) 108 (90 BASE) MCG/ACT inhaler Inhale 2 puffs into the lungs every 6 (six) hours as needed for wheezing.      Marland Kitchen alfuzosin (UROXATRAL) 10 MG 24 hr tablet Take 10 mg by mouth every morning.       Marland Kitchen aspirin EC 81 MG tablet Take 81 mg by mouth daily.      Marland Kitchen Besifloxacin HCl (BESIVANCE) 0.6 % SUSP Place 1 drop into the right eye See admin instructions. Uses for 2 days after each monthly ocular injection.  Next injection due 03/12/2013.      . diphenoxylate-atropine (LOMOTIL) 2.5-0.025 MG per tablet Take 1 tablet by mouth 4 (four) times daily as needed for diarrhea or loose stools.      Marland Kitchen esomeprazole (NEXIUM) 40 MG capsule Take 40 mg by mouth daily before breakfast.      . fexofenadine (ALLEGRA) 180 MG tablet Take 180 mg by mouth daily as needed (for allergies).       . Fluticasone-Salmeterol (ADVAIR DISKUS) 500-50 MCG/DOSE AEPB Inhale 1 puff into the lungs 2 (two) times daily.  60 each  11  . hyoscyamine (LEVSIN SL) 0.125 MG SL tablet Place 0.125 mg under the tongue every 2 (two) hours as needed (for gas and cramps).      . magnesium oxide (MAG-OX) 400 MG tablet Take 400 mg by mouth daily.      . metoprolol succinate (TOPROL-XL) 50 MG 24 hr tablet Take 75 mg by mouth every morning. Take with or immediately following a meal.      . Multiple Vitamins-Minerals (PRESERVISION AREDS 2 PO) Take 1 tablet by mouth 2 (two) times daily.      Marland Kitchen oxybutynin (DITROPAN-XL) 5 MG 24 hr tablet Take 5 mg by mouth daily.      Marland Kitchen oxyCODONE-acetaminophen (PERCOCET) 10-325 MG per tablet Take 1-2 tablets by mouth every 6 (six) hours as needed for pain. MAXIMUM TOTAL ACETAMINOPHEN DOSE IS 4000 MG PER DAY  75 tablet  0  .  potassium chloride SA (K-DUR,KLOR-CON) 20 MEQ tablet Take 20 mEq by mouth daily.      . Probiotic Product (PROBIOTIC PO)  Take 1 tablet by mouth daily.      . quiNINE (QUALAQUIN) 324 MG capsule Take 324 mg by mouth 2 (two) times daily.      . sennosides-docusate sodium (SENOKOT-S) 8.6-50 MG tablet Take 2 tablets by mouth daily.  30 tablet  1  . sildenafil (VIAGRA) 100 MG tablet Take 100 mg by mouth daily as needed for erectile dysfunction.      . simvastatin (ZOCOR) 20 MG tablet Take 20 mg by mouth every evening.      . sulfamethoxazole-trimethoprim (BACTRIM DS) 800-160 MG per tablet Take 1 tablet by mouth 2 (two) times daily.  10 tablet  0  . temazepam (RESTORIL) 30 MG capsule Take 30 mg by mouth at bedtime.      . triamcinolone cream (KENALOG) 0.1 % Apply topically 2 (two) times daily.       No current facility-administered medications on file prior to visit.      Review of Systems Constitutional:  Negative for fever, chills, activity change and unexpected weight change.  HEENT:  Negative for hearing loss, ear pain, congestion, neck stiffness and postnasal drip. Negative for sore throat or swallowing problems. Negative for dental complaints.   Eyes: Negative for vision loss or change in visual acuity.  Respiratory: Negative for chest tightness and wheezing. Positive for DOE.   Cardiovascular: Negative for chest pain or palpitations. Yes decreased exercise tolerance Gastrointestinal: No change in bowel habit. No bloating or gas. No reflux or indigestion Genitourinary: positive for urgency, frequency, flank pain and difficulty urinating.  Musculoskeletal: Negative for myalgias, back pain, arthralgias and gait problem.  Neurological: Negative for dizziness, change in tremors, weakness and headaches.  Hematological: Negative for adenopathy.  Psychiatric/Behavioral: Negative for behavioral problems and dysphoric mood.       Objective:   Physical Exam Filed Vitals:   04/27/13 1403  BP:  140/60  Pulse: 91  Temp: 98 F (36.7 C)   Wt Readings from Last 3 Encounters:  04/27/13 193 lb (87.544 kg)  04/22/13 189 lb 14.4 oz (86.138 kg)  04/09/13 192 lb 12.8 oz (87.454 kg)   BP Readings from Last 3 Encounters:  04/27/13 140/60  04/22/13 109/55  04/09/13 120/60   Gen'l - haggard appearing white man in on acute distress.  HEENT- C&S clear w/o icterus Cor 2+ radial, RRR, 3+ pitting edema Left LE to level of knee, 1-2 + edema right LE PUlm - good breath sounds, no rales wheeze or rhonchi Abd - BS- hyperactive throughout. Point of maximum tenderness LLQ w/o rebound Ext - right knee healed surgical scar w/ erythema and warm. Left LE with swelling and minor calve tenderness.  Lab (done as outpatient at [facility] - not in epic) D-dimer 3.4, CBC normal, Bmet normal       Assessment & Plan:  1. Leg swelling - concern is for DVT. D-dimer was positive leading to urgent CT angio chest - negative for PE.  Plan Will schedule for LE venous doppler to r/o DVT  2. Abdominal pain - focal to LLQ on exam. No rebound. Concern for diverticulitis. (known diverticulosis by colonoscopy)  Plan Cipro 500 mg bid x 7; Flagyl 500 mg TID x 7

## 2013-04-28 ENCOUNTER — Telehealth: Payer: Self-pay | Admitting: *Deleted

## 2013-04-28 ENCOUNTER — Encounter: Payer: Self-pay | Admitting: Internal Medicine

## 2013-04-28 ENCOUNTER — Ambulatory Visit (HOSPITAL_COMMUNITY): Payer: Medicare Other | Attending: Internal Medicine

## 2013-04-28 DIAGNOSIS — Z87891 Personal history of nicotine dependence: Secondary | ICD-10-CM | POA: Insufficient documentation

## 2013-04-28 DIAGNOSIS — R0602 Shortness of breath: Secondary | ICD-10-CM

## 2013-04-28 DIAGNOSIS — R29898 Other symptoms and signs involving the musculoskeletal system: Secondary | ICD-10-CM | POA: Insufficient documentation

## 2013-04-28 DIAGNOSIS — M7989 Other specified soft tissue disorders: Secondary | ICD-10-CM

## 2013-04-28 DIAGNOSIS — E785 Hyperlipidemia, unspecified: Secondary | ICD-10-CM | POA: Insufficient documentation

## 2013-04-28 NOTE — Assessment & Plan Note (Signed)
At today's exam patient's right knee is mildly erythematous, warm to touch, w/o significant effusion and painless.  Plan  close observation (notified Dr. Dion Saucier)

## 2013-04-28 NOTE — Telephone Encounter (Signed)
[facility] Triage Call Report Triage Record Num: [id number] Operator: Chevis Pretty Patient Name: Jack [name] Call Date & Time: 04/27/2013 6:18:45PM Patient Phone: [phone] PCP: Illene Regulus Patient Gender: Male PCP Fax : [phone] Patient DOB: 07-10-1928 Practice Name: [facility] Reason for Call: Caller: Shanda Bumps; PCP: Illene Regulus (Adults only); CB#: [phone]; Call regarding Shanda Bumps is calling from [facility] regarding a CBC ordered on [name] by Illene Regulus (Adults only). D-dimer alert high 3.45; will insert to EMR. Per office protocol, results sent to office. Protocol(s) Used: PCP Calls, No Triage (Adult) Recommended Outcome per Protocol: Call Provider within 24 Hours Reason for Outcome: Lab calling with test results Care Advice: ~ 11/

## 2013-04-29 ENCOUNTER — Encounter: Payer: Self-pay | Admitting: Internal Medicine

## 2013-04-29 ENCOUNTER — Other Ambulatory Visit: Payer: Self-pay

## 2013-04-30 ENCOUNTER — Encounter: Payer: Self-pay | Admitting: Internal Medicine

## 2013-05-02 ENCOUNTER — Encounter: Payer: Self-pay | Admitting: Internal Medicine

## 2013-05-03 ENCOUNTER — Encounter: Payer: Self-pay | Admitting: Internal Medicine

## 2013-05-04 ENCOUNTER — Observation Stay (HOSPITAL_COMMUNITY): Payer: Medicare Other

## 2013-05-04 ENCOUNTER — Inpatient Hospital Stay (HOSPITAL_COMMUNITY)
Admission: AD | Admit: 2013-05-04 | Discharge: 2013-05-08 | DRG: 841 | Disposition: A | Discharge status: Home Health | Payer: Medicare Other | Source: Ambulatory Visit | Attending: Internal Medicine | Admitting: Internal Medicine

## 2013-05-04 ENCOUNTER — Encounter (HOSPITAL_COMMUNITY): Payer: Self-pay

## 2013-05-04 DIAGNOSIS — J438 Other emphysema: Secondary | ICD-10-CM

## 2013-05-04 DIAGNOSIS — R1032 Left lower quadrant pain: Secondary | ICD-10-CM

## 2013-05-04 DIAGNOSIS — C8589 Other specified types of non-Hodgkin lymphoma, extranodal and solid organ sites: Secondary | ICD-10-CM

## 2013-05-04 DIAGNOSIS — K219 Gastro-esophageal reflux disease without esophagitis: Secondary | ICD-10-CM | POA: Diagnosis present

## 2013-05-04 DIAGNOSIS — Z96659 Presence of unspecified artificial knee joint: Secondary | ICD-10-CM

## 2013-05-04 DIAGNOSIS — C8299 Follicular lymphoma, unspecified, extranodal and solid organ sites: Principal | ICD-10-CM

## 2013-05-04 DIAGNOSIS — H3530 Unspecified macular degeneration: Secondary | ICD-10-CM | POA: Diagnosis present

## 2013-05-04 DIAGNOSIS — H9190 Unspecified hearing loss, unspecified ear: Secondary | ICD-10-CM | POA: Diagnosis present

## 2013-05-04 DIAGNOSIS — Z800 Family history of malignant neoplasm of digestive organs: Secondary | ICD-10-CM

## 2013-05-04 DIAGNOSIS — R600 Localized edema: Secondary | ICD-10-CM

## 2013-05-04 DIAGNOSIS — K5730 Diverticulosis of large intestine without perforation or abscess without bleeding: Secondary | ICD-10-CM

## 2013-05-04 DIAGNOSIS — H353290 Exudative age-related macular degeneration, unspecified eye, stage unspecified: Secondary | ICD-10-CM

## 2013-05-04 DIAGNOSIS — L02419 Cutaneous abscess of limb, unspecified: Secondary | ICD-10-CM

## 2013-05-04 DIAGNOSIS — H353230 Exudative age-related macular degeneration, bilateral, stage unspecified: Secondary | ICD-10-CM

## 2013-05-04 DIAGNOSIS — L03116 Cellulitis of left lower limb: Secondary | ICD-10-CM

## 2013-05-04 DIAGNOSIS — Z87891 Personal history of nicotine dependence: Secondary | ICD-10-CM

## 2013-05-04 DIAGNOSIS — I10 Essential (primary) hypertension: Secondary | ICD-10-CM | POA: Diagnosis present

## 2013-05-04 DIAGNOSIS — I871 Compression of vein: Secondary | ICD-10-CM | POA: Diagnosis present

## 2013-05-04 DIAGNOSIS — Z8249 Family history of ischemic heart disease and other diseases of the circulatory system: Secondary | ICD-10-CM

## 2013-05-04 DIAGNOSIS — H409 Unspecified glaucoma: Secondary | ICD-10-CM | POA: Diagnosis present

## 2013-05-04 DIAGNOSIS — E785 Hyperlipidemia, unspecified: Secondary | ICD-10-CM | POA: Diagnosis present

## 2013-05-04 DIAGNOSIS — K7030 Alcoholic cirrhosis of liver without ascites: Secondary | ICD-10-CM

## 2013-05-04 DIAGNOSIS — R599 Enlarged lymph nodes, unspecified: Secondary | ICD-10-CM

## 2013-05-04 DIAGNOSIS — N419 Inflammatory disease of prostate, unspecified: Secondary | ICD-10-CM | POA: Diagnosis present

## 2013-05-04 DIAGNOSIS — Z8719 Personal history of other diseases of the digestive system: Secondary | ICD-10-CM

## 2013-05-04 DIAGNOSIS — Z880 Allergy status to penicillin: Secondary | ICD-10-CM

## 2013-05-04 DIAGNOSIS — Z79899 Other long term (current) drug therapy: Secondary | ICD-10-CM

## 2013-05-04 DIAGNOSIS — T50995A Adverse effect of other drugs, medicaments and biological substances, initial encounter: Secondary | ICD-10-CM | POA: Diagnosis not present

## 2013-05-04 DIAGNOSIS — I491 Atrial premature depolarization: Secondary | ICD-10-CM | POA: Diagnosis present

## 2013-05-04 DIAGNOSIS — G4700 Insomnia, unspecified: Secondary | ICD-10-CM

## 2013-05-04 DIAGNOSIS — R609 Edema, unspecified: Secondary | ICD-10-CM

## 2013-05-04 DIAGNOSIS — T451X5A Adverse effect of antineoplastic and immunosuppressive drugs, initial encounter: Secondary | ICD-10-CM | POA: Diagnosis not present

## 2013-05-04 DIAGNOSIS — C8590 Non-Hodgkin lymphoma, unspecified, unspecified site: Secondary | ICD-10-CM

## 2013-05-04 DIAGNOSIS — M1120 Other chondrocalcinosis, unspecified site: Secondary | ICD-10-CM | POA: Diagnosis present

## 2013-05-04 LAB — CBC WITH DIFFERENTIAL/PLATELET
Basophils Relative: 1 % (ref 0–1)
Eosinophils Absolute: 0.3 10*3/uL (ref 0.0–0.7)
Eosinophils Relative: 3 % (ref 0–5)
HCT: 34.3 % — ABNORMAL LOW (ref 39.0–52.0)
Hemoglobin: 11.1 g/dL — ABNORMAL LOW (ref 13.0–17.0)
Lymphs Abs: 1.9 10*3/uL (ref 0.7–4.0)
MCH: 29 pg (ref 26.0–34.0)
MCV: 89.6 fL (ref 78.0–100.0)
Monocytes Absolute: 0.9 10*3/uL (ref 0.1–1.0)
Monocytes Relative: 10 % (ref 3–12)
RBC: 3.83 MIL/uL — ABNORMAL LOW (ref 4.22–5.81)
RDW: 14.5 % (ref 11.5–15.5)

## 2013-05-04 LAB — COMPREHENSIVE METABOLIC PANEL
ALT: 14 U/L (ref 0–53)
Alkaline Phosphatase: 64 U/L (ref 39–117)
BUN: 10 mg/dL (ref 6–23)
Calcium: 9.8 mg/dL (ref 8.4–10.5)
Creatinine, Ser: 0.8 mg/dL (ref 0.50–1.35)
GFR calc Af Amer: >90 mL/min (ref >90)
Glucose, Bld: 109 mg/dL — ABNORMAL HIGH (ref 70–99)
Potassium: 4.7 mEq/L (ref 3.5–5.1)
Total Protein: 6.9 g/dL (ref 6.0–8.3)

## 2013-05-04 MED ORDER — ACETAMINOPHEN 650 MG RE SUPP: 650.0000 mg | Freq: Four times a day (QID) | RECTAL | Status: DC | PRN

## 2013-05-04 MED ORDER — METRONIDAZOLE 500 MG PO TABS
500.0000 mg | ORAL_TABLET | Freq: Three times a day (TID) | ORAL | Status: DC
  Filled 2013-05-04 (×2): qty 1

## 2013-05-04 MED ORDER — IOHEXOL 300 MG/ML  SOLN
100.0000 mL | Freq: Once | INTRAMUSCULAR | Status: AC | PRN
  Administered 2013-05-04: 100 mL via INTRAVENOUS

## 2013-05-04 MED ORDER — METOPROLOL SUCCINATE ER 50 MG PO TB24
75.0000 mg | ORAL_TABLET | Freq: Every morning | ORAL | Status: DC
  Administered 2013-05-05 – 2013-05-08 (×4): 75 mg via ORAL
  Filled 2013-05-04 (×4): qty 1

## 2013-05-04 MED ORDER — ONDANSETRON HCL 4 MG PO TABS: 4.0000 mg | ORAL_TABLET | Freq: Four times a day (QID) | ORAL | Status: DC | PRN

## 2013-05-04 MED ORDER — PROBIOTIC PO CAPS: ORAL_CAPSULE | Freq: Every day | ORAL | Status: DC

## 2013-05-04 MED ORDER — HYDROMORPHONE HCL PF 1 MG/ML IJ SOLN
INTRAMUSCULAR | Status: AC
  Administered 2013-05-04: 0.5 mg
  Filled 2013-05-04: qty 1

## 2013-05-04 MED ORDER — ONDANSETRON HCL 4 MG/2ML IJ SOLN: 4.0000 mg | Freq: Four times a day (QID) | INTRAMUSCULAR | Status: DC | PRN

## 2013-05-04 MED ORDER — SODIUM CHLORIDE 0.45 % IV SOLN
50.0000 mL/h | INTRAVENOUS | Status: DC
  Administered 2013-05-04 – 2013-05-07 (×6): 50 mL/h via INTRAVENOUS

## 2013-05-04 MED ORDER — ALBUTEROL SULFATE HFA 108 (90 BASE) MCG/ACT IN AERS: 2.0000 | INHALATION_SPRAY | Freq: Four times a day (QID) | RESPIRATORY_TRACT | Status: DC | PRN

## 2013-05-04 MED ORDER — ASPIRIN EC 81 MG PO TBEC
81.0000 mg | DELAYED_RELEASE_TABLET | Freq: Every day | ORAL | Status: DC
  Administered 2013-05-05 – 2013-05-08 (×4): 81 mg via ORAL
  Filled 2013-05-04 (×4): qty 1

## 2013-05-04 MED ORDER — LORAZEPAM 0.5 MG PO TABS
0.5000 mg | ORAL_TABLET | ORAL | Status: DC | PRN
  Administered 2013-05-04 – 2013-05-07 (×6): 0.5 mg via ORAL
  Filled 2013-05-04 (×6): qty 1

## 2013-05-04 MED ORDER — CIPROFLOXACIN HCL 500 MG PO TABS
500.0000 mg | ORAL_TABLET | Freq: Two times a day (BID) | ORAL | Status: DC
  Filled 2013-05-04 (×2): qty 1

## 2013-05-04 MED ORDER — TEMAZEPAM 15 MG PO CAPS: 30.0000 mg | ORAL_CAPSULE | Freq: Every day | ORAL | Status: DC

## 2013-05-04 MED ORDER — OXYBUTYNIN CHLORIDE ER 5 MG PO TB24
5.0000 mg | ORAL_TABLET | Freq: Every day | ORAL | Status: DC
  Administered 2013-05-05 – 2013-05-08 (×4): 5 mg via ORAL
  Filled 2013-05-04 (×4): qty 1

## 2013-05-04 MED ORDER — ACETAMINOPHEN 325 MG PO TABS: 650.0000 mg | ORAL_TABLET | Freq: Four times a day (QID) | ORAL | Status: DC | PRN

## 2013-05-04 MED ORDER — HYDROMORPHONE HCL PF 1 MG/ML IJ SOLN
0.5000 mg | INTRAMUSCULAR | Status: DC | PRN
  Administered 2013-05-04 – 2013-05-05 (×3): 0.5 mg via INTRAVENOUS
  Filled 2013-05-04 (×4): qty 1

## 2013-05-04 MED ORDER — HYDROCODONE-ACETAMINOPHEN 5-325 MG PO TABS
1.0000 | ORAL_TABLET | ORAL | Status: DC | PRN
  Administered 2013-05-04 – 2013-05-05 (×3): 2 via ORAL
  Filled 2013-05-04 (×3): qty 2

## 2013-05-04 MED ORDER — IOHEXOL 300 MG/ML  SOLN: 50.0000 mL | Freq: Once | INTRAMUSCULAR | Status: AC | PRN

## 2013-05-04 MED ORDER — ALFUZOSIN HCL ER 10 MG PO TB24
10.0000 mg | ORAL_TABLET | Freq: Every morning | ORAL | Status: DC
  Administered 2013-05-05 – 2013-05-08 (×4): 10 mg via ORAL
  Filled 2013-05-04 (×5): qty 1

## 2013-05-04 MED ORDER — ALBUTEROL SULFATE HFA 108 (90 BASE) MCG/ACT IN AERS
2.0000 | INHALATION_SPRAY | Freq: Four times a day (QID) | RESPIRATORY_TRACT | Status: DC | PRN
  Administered 2013-05-08: 2 via RESPIRATORY_TRACT
  Filled 2013-05-04: qty 6.7

## 2013-05-04 MED ORDER — POTASSIUM CHLORIDE CRYS ER 20 MEQ PO TBCR
20.0000 meq | EXTENDED_RELEASE_TABLET | Freq: Every day | ORAL | Status: DC
  Administered 2013-05-05 – 2013-05-08 (×4): 20 meq via ORAL
  Filled 2013-05-04 (×4): qty 1

## 2013-05-04 MED ORDER — PANTOPRAZOLE SODIUM 40 MG PO TBEC
80.0000 mg | DELAYED_RELEASE_TABLET | Freq: Every day | ORAL | Status: DC
  Administered 2013-05-05 – 2013-05-08 (×4): 80 mg via ORAL
  Filled 2013-05-04 (×4): qty 2

## 2013-05-04 MED ORDER — DEXTROSE 5 % IV SOLN
1.0000 g | Freq: Three times a day (TID) | INTRAVENOUS | Status: DC
  Administered 2013-05-04 – 2013-05-06 (×6): 1 g via INTRAVENOUS
  Filled 2013-05-04 (×9): qty 1

## 2013-05-04 MED ORDER — ENOXAPARIN SODIUM 40 MG/0.4ML ~~LOC~~ SOLN
40.0000 mg | SUBCUTANEOUS | Status: DC
  Administered 2013-05-04 – 2013-05-07 (×4): 40 mg via SUBCUTANEOUS
  Filled 2013-05-04 (×5): qty 0.4

## 2013-05-04 MED ORDER — MOMETASONE FURO-FORMOTEROL FUM 200-5 MCG/ACT IN AERO
2.0000 | INHALATION_SPRAY | Freq: Two times a day (BID) | RESPIRATORY_TRACT | Status: DC
  Administered 2013-05-04 – 2013-05-08 (×8): 2 via RESPIRATORY_TRACT
  Filled 2013-05-04: qty 8.8

## 2013-05-04 MED ORDER — SACCHAROMYCES BOULARDII 250 MG PO CAPS
250.0000 mg | ORAL_CAPSULE | Freq: Every day | ORAL | Status: DC
  Administered 2013-05-05 – 2013-05-06 (×2): 250 mg via ORAL
  Filled 2013-05-04 (×3): qty 1

## 2013-05-04 MED ORDER — VANCOMYCIN HCL IN DEXTROSE 1-5 GM/200ML-% IV SOLN
1000.0000 mg | Freq: Two times a day (BID) | INTRAVENOUS | Status: DC
  Administered 2013-05-04 – 2013-05-06 (×5): 1000 mg via INTRAVENOUS
  Filled 2013-05-04 (×5): qty 200

## 2013-05-04 NOTE — H&P (Signed)
Jack [name] is an 79 y.o. male.   Chief Complaint: Severe lower abdominal pain that has not responded to antibiotic treatment. Progressive swelling left LE. HPI: Jack [name] presented 04/27/13 for abdominal discomfort. He describes a sharp pain in the lower abdomen. The pain is constant but will wax and wane - o/o to 8/10. Gets relief with Oxycodone/APAP. BM makes it better. Eating may make it worse. No epigastic pain. He has been taking probiotic, Peptobismal and metamucil. He is also taking Nexium twice a day. He was diagnosed with possible diverticulitis and was started on Cipro and Flagyl. His pain initially improved but has been severe for the past several days. He is now admitted for further diagnostic testing.  At the Nov 4th visit he also c/o swelling of the left leg (had right TKR) that was persistent. He did report increased SOB leading to a D-dimer which returned as elevated 3.45. Subsequently he had a CT angio chest that was negative for PE. He then had LE venous doppler that was negative for DVT. He continues to have swelling of the leg despite elevation and stockings. Over the last 24-36 hrs there is progressive swelling and also erythema from toes to groin and the whole leg is very warm to touch. He is now admitted to evaluate for extrinsic venous compression abdomen that may be the cause of his unilateral edema and for IV antibiotics for cellulitis. .     Past Medical History  Diagnosis Date  . GERD (gastroesophageal reflux disease)   . Osteoarthritis   . Alcohol abuse     in recovery 2006  . 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 wine only  . Irritable bowel   . Hyperlipidemia   . Insomnia   . PAC (premature atrial contraction)     stable (ruled out for a fib)  . Prostatitis     chronic bacterial  . Pseudogout   . Sinus problem     chronic  . Cellulitis     2nd to T. Pedis(not at present)  . Hearing loss 03-10-12    bilateral hearing  aids  . Complication of anesthesia 03-10-12    after shoulder  surgery -difficulty awakening,breathing problems  . Emphysema 03-10-12    tx. inhalers, uses steam room frequently  . Cramping of feet 03-10-12    cramping of both legs and hands occ.  . Non Hodgkin's lymphoma 03-10-12     '07-1 yr. in remission(Shadad)-not seeing now  . Osteoarthritis of left knee 03/17/2012  . Glaucoma   . Macular degeneration   . Hypertension   . Shortness of breath     Past Surgical History  Procedure Laterality Date  . Esophagogastroduodenoscopy  01-27-02  . Inguinal hernia repair  1979  . Torn biceps repair  03-10-12    Left rotator cuff repair  . Hernia repair    . Cataract surgery  03-10-12    03-04-12(right)/ (03-11-12-left)  . Total knee arthroplasty  03/17/2012    Procedure: TOTAL KNEE ARTHROPLASTY;  Surgeon: Eulas Post, MD;  Location: [facility];  Service: Orthopedics;  Laterality: Left;  Marland Kitchen Eye surgery  2014    cataract extraction with IOL both eyes staged  . Colonoscopy    . Rotator cuff repair Left   . Total knee arthroplasty Right 03/22/2013    Procedure: TOTAL KNEE ARTHROPLASTY;  Surgeon: Eulas Post, MD;  Location: [facility];  Service: Orthopedics;  Laterality: Right;  righ total  knee arthroplasty    Family History  Problem Relation Age of Onset  . Coronary artery disease Father   . Heart attack Father   . Cancer Brother     colon  . Other Other     TB   Social History:  reports that he quit smoking about 34 years ago. His smoking use included Cigarettes. He smoked 0.00 packs per day. He has never used smokeless tobacco. He reports that he drinks about 6.7 ounces of alcohol per week. He reports that he does not use illicit drugs.  [employer] [place]. married '56- widowed '07. 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 Socially active. Active in AA  End of Life issues: He does want CPR; no prolonged intubation; no futile or heroic measure to maintain him if the quality of life isn't good. HCPOA Son - Raymont Andreoni ([phone].      Allergies:  Allergies  Allergen Reactions  . Amoxicillin     REACTION: rash  . Penicillins Rash   No current facility-administered medications on file prior to encounter.   Current Outpatient Prescriptions on File Prior to Encounter  Medication Sig Dispense Refill  . albuterol (PROVENTIL HFA;VENTOLIN HFA) 108 (90 BASE) MCG/ACT inhaler Inhale 2 puffs into the lungs every 6 (six) hours as needed for shortness of breath.       Marland Kitchen albuterol (PROVENTIL HFA;VENTOLIN HFA) 108 (90 BASE) MCG/ACT inhaler Inhale 2 puffs into the lungs every 6 (six) hours as needed for wheezing.      Marland Kitchen alfuzosin (UROXATRAL) 10 MG 24 hr tablet Take 10 mg by mouth every morning.       Marland Kitchen aspirin EC 81 MG tablet Take 81 mg by mouth daily.      Marland Kitchen Besifloxacin HCl (BESIVANCE) 0.6 % SUSP Place 1 drop into the right eye See admin instructions. Uses for 2 days after each monthly ocular injection.  Next injection due 03/12/2013.      . ciprofloxacin (CIPRO) 500 MG tablet Take 1 tablet (500 mg total) by mouth 2 (two) times daily.  14 tablet  0  . diphenoxylate-atropine (LOMOTIL) 2.5-0.025 MG per tablet Take 1 tablet by mouth 4 (four) times daily as needed for diarrhea or loose stools.      Marland Kitchen esomeprazole (NEXIUM) 40 MG capsule Take 40 mg by mouth daily before breakfast.      . fexofenadine (ALLEGRA) 180 MG tablet Take 180 mg by mouth daily as needed (for allergies).       . Fluticasone-Salmeterol (ADVAIR DISKUS) 500-50 MCG/DOSE AEPB Inhale 1 puff into the lungs 2 (two) times daily.  60 each  11  . hyoscyamine (LEVSIN SL) 0.125 MG SL tablet Place 0.125 mg under the tongue every 2 (two) hours as needed (for gas and  cramps).      . magnesium oxide (MAG-OX) 400 MG tablet Take 400 mg by mouth daily.      . metoprolol succinate (TOPROL-XL) 50 MG 24 hr tablet Take 75 mg by mouth every morning. Take with or immediately following a meal.      . metroNIDAZOLE (FLAGYL) 500 MG tablet Take 1 tablet (500 mg total) by mouth 3 (three) times daily.  21 tablet  0  . Multiple Vitamins-Minerals (PRESERVISION AREDS 2  PO) Take 1 tablet by mouth 2 (two) times daily.      Marland Kitchen oxybutynin (DITROPAN-XL) 5 MG 24 hr tablet Take 5 mg by mouth daily.      Marland Kitchen oxyCODONE-acetaminophen (PERCOCET) 10-325 MG per tablet Take 1-2 tablets by mouth every 6 (six) hours as needed for pain. MAXIMUM TOTAL ACETAMINOPHEN DOSE IS 4000 MG PER DAY  75 tablet  0  . potassium chloride SA (K-DUR,KLOR-CON) 20 MEQ tablet Take 20 mEq by mouth daily.      . Probiotic Product (PROBIOTIC PO) Take 1 tablet by mouth daily.      . quiNINE (QUALAQUIN) 324 MG capsule Take 324 mg by mouth 2 (two) times daily.      . sennosides-docusate sodium (SENOKOT-S) 8.6-50 MG tablet Take 2 tablets by mouth daily.  30 tablet  1  . sildenafil (VIAGRA) 100 MG tablet Take 100 mg by mouth daily as needed for erectile dysfunction.      . simvastatin (ZOCOR) 20 MG tablet Take 20 mg by mouth every evening.      . sulfamethoxazole-trimethoprim (BACTRIM DS) 800-160 MG per tablet Take 1 tablet by mouth 2 (two) times daily.  10 tablet  0  . temazepam (RESTORIL) 30 MG capsule Take 30 mg by mouth at bedtime.      . triamcinolone cream (KENALOG) 0.1 % Apply topically 2 (two) times daily.              Review of Systems  Constitutional: Negative for fever, chills and weight loss.  HENT: Negative for congestion, nosebleeds and tinnitus.   Eyes: Negative.   Respiratory: Negative for cough, shortness of breath and wheezing.   Cardiovascular: Positive for leg swelling. Negative for chest pain, palpitations and claudication.  Gastrointestinal: Positive for abdominal pain. Negative for heartburn,  nausea, vomiting, diarrhea, blood in stool and melena.  Genitourinary: Negative.   Musculoskeletal: Negative for joint pain and myalgias.  Skin: Negative.   Neurological: Positive for weakness. Negative for dizziness, tremors, focal weakness, loss of consciousness and headaches.  Endo/Heme/Allergies: Negative.   Psychiatric/Behavioral: Negative.     There were no vitals taken for this visit. Physical Exam  Filed Vitals:   05/04/13 1132  BP: 113/57  Pulse: 75  Temp: 97.9 F (36.6 C)  Resp: 18   Gen'l- Older man in no acute distress, specifically denies pain in the leg HEENT- normal Nodes - no palpable inguinal nodes. Cor- 2+ radial pulse, 2+ DP pulse left. RRR. Left LE with 3+ edema foot to mid thigh PUlm - normal respirations, no increased WOB Abd- protruberant, very firm, no guarding or rebound tenderness. Too firm for adequate palpation Genitalia deferred Derm - T. Pedis with dead skin and small fissure between 4th and 5th toes. Left leg is red with ascending streaking to groin, very warm to touch but not tender. Assessment/Plan 1. Abdominal pain - the working diagnosis was diverticulitis based on physical exam and history. He initially saw improvement with antibiotics but has had recurrence of severe pain.  Plan Lab - CMet and CBC  CT abdomen - r/o diverticular abscess or other cause of pain.  2. Left LE edema - no DVT by  LE venous doppler. No active heart failure or renal failure. He has a history of lymphoma. Concern for obstruction of venous flow.  Plan CT abdomen/pelvis r/o extrinsic compression   3. ID - left leg with rubor, calore, tumor but not dolore c/w cellulitis. T/Pedis fissure as possible portal of entry  Plan Vanc/Cefepime- pharmacy  to dose  Blood cultures 2 sites.   Casimiro Needle Ariday Brinker 05/04/2013, 6:18 AM

## 2013-05-04 NOTE — Progress Notes (Addendum)
ANTIBIOTIC CONSULT NOTE - INITIAL  Pharmacy Consult for Vancomycin & Cefepime Indication: rule out diverticular abscess, rule out LE cellulitis  Allergies  Allergen Reactions  . Amoxicillin     REACTION: rash  . Penicillins Rash   Patient Measurements:   Total body weight 88 kg  Vital Signs: Temp: 97.9 F (36.6 C) (11/11 1132) Temp src: Oral (11/11 1132) BP: 113/57 mmHg (11/11 1132) Pulse Rate: 75 (11/11 1132) Intake/Output from previous day:   Intake/Output from this shift:   Labs: No results found for this basename: WBC, HGB, PLT, LABCREA, CREATININE,  in the last 72 hours The CrCl is unknown because both a height and weight (above a minimum accepted value) are required for this calculation. No results found for this basename: VANCOTROUGH, VANCOPEAK, VANCORANDOM, GENTTROUGH, GENTPEAK, GENTRANDOM, TOBRATROUGH, TOBRAPEAK, TOBRARND, AMIKACINPEAK, AMIKACINTROU, AMIKACIN,  in the last 72 hours   Microbiology: No results found for this or any previous visit (from the past 720 hour(s)).  Medical History: Past Medical History  Diagnosis Date  . GERD (gastroesophageal reflux disease)   . Osteoarthritis   . Alcohol abuse     in recovery 2006  . 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 wine only  . Irritable bowel   . Hyperlipidemia   . Insomnia   . PAC (premature atrial contraction)     stable (ruled out for a fib)  . Prostatitis     chronic bacterial  . Pseudogout   . Sinus problem     chronic  . Cellulitis     2nd to T. Pedis(not at present)  . Hearing loss 03-10-12    bilateral hearing aids  . Complication of anesthesia 03-10-12    after shoulder  surgery -difficulty awakening,breathing problems  . Emphysema 03-10-12    tx. inhalers, uses steam room frequently  . Cramping of feet 03-10-12    cramping of both legs and hands occ.  . Non Hodgkin's lymphoma 03-10-12     '07-1 yr. in remission(Shadad)-not seeing now  .  Osteoarthritis of left knee 03/17/2012  . Glaucoma   . Macular degeneration   . Hypertension   . Shortness of breath    Medications:  Anti-infectives   Start     Dose/Rate Route Frequency Ordered Stop   05/04/13 1300  vancomycin (VANCOCIN) IVPB 1000 mg/200 mL premix     1,000 mg 200 mL/hr over 60 Minutes Intravenous Every 12 hours 05/04/13 1212     05/04/13 1215  ceFEPIme (MAXIPIME) 1 g in dextrose 5 % 50 mL IVPB     1 g 100 mL/hr over 30 Minutes Intravenous 3 times per day 05/04/13 1211     05/04/13 1130  ciprofloxacin (CIPRO) tablet 500 mg     500 mg Oral 2 times daily 05/04/13 1123     05/04/13 1130  metroNIDAZOLE (FLAGYL) tablet 500 mg     500 mg Oral 3 times daily 05/04/13 1123       Assessment:  83 yoM with severe lower abd pain and progressive swelling of LLE. Possible diverticulitis treated as outpt with Cipro/Flagyl po - initial improvement seen but worsened pain. 11/4 visit ruled out DVT and PE. Possible abd venous compression as cause of LLE swelling.  Vancomycin & Cefepime ordered: pharmacy to dose  Renal fx wnl  Goal of Therapy:  Vancomycin trough 15-20 mcg/ml  Plan:   Vancomycin 1gm q12  Cefepime 1gm q8hr  Narrow abx based on clinical course  CT of  abdomen  Otho Bellows PharmD Pager [phone] 05/04/2013, 12:21 PM

## 2013-05-04 NOTE — Progress Notes (Signed)
PM note - CT abd/pelvis: IMPRESSION:  Interval development of prominent necrotic-appearing adenopathy.  Adenopathy is largest in the left external iliac region measuring up  to 6.7 cm. The left external iliac artery traverses through  adenopathy. The left external iliac vein is compressed and may be  occluded. The left common iliac vein is narrowed.  Adenopathy noted in the left operator region, left common iliac  region, at the level of the aortic bifurcation, left periaortic and  inter aortic caval region as well as the anterior to the inferior  vena cava. Adenopathy invades the left psoas muscle.  Posterior to the left psoas muscle 3.4 cm lesion consistent with  lymphoma.  Enlargement upper left thigh with stranding of fat planes consistent  with poor venous return caused by the adenopathy.  Atherosclerotic type changes of the abdominal aorta. Prominent  narrowing origin of the common iliac arteries more notable on the  left. Ectatic common iliac arteries.  Diverticular with mild muscular hypertrophy sigmoid colon without  extra luminal bowel inflammatory process, free fluid or free air.  Impression upon the bladder base by prostate gland which is  partially calcified. Correlation recommended.  Degenerative changes lumbar spine. Bilateral femoral head avascular  necrosis greater on the right.  Probable recurrent lymphoma.  Plan - [name] is seeing Jack [name] and will most likely initiated therapy tomorrow.

## 2013-05-04 NOTE — Consult Note (Signed)
Reason for Referral: Lymphoma.  HPI: This is a 79 year old gentleman with grade 3 stage II follicular lymphoma diagnosed in 2007.   Patient treated with 6 cycles of CHOP with rituximab. Therapy concluded August 2007. He had a complete response and had been in remission since that time.   Mr. Nyman presented 04/27/13 for abdominal discomfort. He describes a sharp pain in the lower abdomen. The pain is constant but will wax and wane - o/o to 8/10. Gets relief with Oxycodone/APAP. BM makes it better. Eating may make it worse. No epigastic pain. He has been taking probiotic, Peptobismal and metamucil. He is also taking Nexium twice a day. He was diagnosed with possible diverticulitis and was started on Cipro and Flagyl. His pain initially improved but has been severe for the past several days. He is now admitted for further diagnostic testing.  CT scan obtained on 11/11 showed Interval development of prominent necrotic-appearing adenopathy.  Adenopathy is largest in the left external iliac region measuring up to 6.7 cm. The left external iliac artery traverses through adenopathy. The left external iliac vein is compressed and may be  occluded. The left common iliac vein is narrowed.  I was asked to comment on these current findings.   Past Medical History  Diagnosis Date  . GERD (gastroesophageal reflux disease)   . Osteoarthritis   . Alcohol abuse     in recovery 2006  . 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 wine only  . Irritable bowel   . Hyperlipidemia   . Insomnia   . PAC (premature atrial contraction)     stable (ruled out for a fib)  . Prostatitis     chronic bacterial  . Pseudogout   . Sinus problem     chronic  . Cellulitis     2nd to T. Pedis(not at present)  . Hearing loss 03-10-12    bilateral hearing aids  . Complication of anesthesia 03-10-12    after shoulder  surgery -difficulty awakening,breathing problems  . Emphysema 03-10-12     tx. inhalers, uses steam room frequently  . Cramping of feet 03-10-12    cramping of both legs and hands occ.  . Non Hodgkin's lymphoma 03-10-12     '07-1 yr. in remission(Aastha Dayley)-not seeing now  . Osteoarthritis of left knee 03/17/2012  . Glaucoma   . Macular degeneration   . Hypertension   . Shortness of breath   :  Past Surgical History  Procedure Laterality Date  . Esophagogastroduodenoscopy  01-27-02  . Inguinal hernia repair  1979  . Torn biceps repair  03-10-12    Left rotator cuff repair  . Hernia repair    . Cataract surgery  03-10-12    03-04-12(right)/ (03-11-12-left)  . Total knee arthroplasty  03/17/2012    Procedure: TOTAL KNEE ARTHROPLASTY;  Surgeon: Eulas Post, MD;  Location: [facility];  Service: Orthopedics;  Laterality: Left;  Marland Kitchen Eye surgery  2014    cataract extraction with IOL both eyes staged  . Colonoscopy    . Rotator cuff repair Left   . Total knee arthroplasty Right 03/22/2013    Procedure: TOTAL KNEE ARTHROPLASTY;  Surgeon: Eulas Post, MD;  Location: [facility];  Service: Orthopedics;  Laterality: Right;  righ total knee arthroplasty  :  Current Facility-Administered Medications  Medication Dose Route Frequency Provider Last Rate Last Dose  . 0.45 % sodium chloride infusion  50 mL/hr Intravenous Continuous Jacques Navy, MD 50  mL/hr at 05/04/13 1239 50 mL/hr at 05/04/13 1239  . acetaminophen (TYLENOL) tablet 650 mg  650 mg Oral Q6H PRN Jacques Navy, MD       Or  . acetaminophen (TYLENOL) suppository 650 mg  650 mg Rectal Q6H PRN Jacques Navy, MD      . albuterol (PROVENTIL HFA;VENTOLIN HFA) 108 (90 BASE) MCG/ACT inhaler 2 puff  2 puff Inhalation Q6H PRN Jacques Navy, MD      . albuterol (PROVENTIL HFA;VENTOLIN HFA) 108 (90 BASE) MCG/ACT inhaler 2 puff  2 puff Inhalation Q6H PRN Jacques Navy, MD      . alfuzosin (UROXATRAL) 24 hr tablet 10 mg  10 mg Oral q morning - 10a Jacques Navy, MD      . aspirin EC tablet 81 mg  81 mg Oral Daily  Jacques Navy, MD      . ceFEPIme (MAXIPIME) 1 g in dextrose 5 % 50 mL IVPB  1 g Intravenous Q8H Terri L Green, RPH   1 g at 05/04/13 1239  . enoxaparin (LOVENOX) injection 40 mg  40 mg Subcutaneous Q24H Jacques Navy, MD      . HYDROcodone-acetaminophen (NORCO/VICODIN) 5-325 MG per tablet 1-2 tablet  1-2 tablet Oral Q4H PRN Jacques Navy, MD      . HYDROmorphone (DILAUDID) injection 0.5 mg  0.5 mg Intravenous Q3H PRN Jacques Navy, MD   0.5 mg at 05/04/13 1703  . iohexol (OMNIPAQUE) 300 MG/ML solution 50 mL  50 mL Oral Once PRN Medication Radiologist, MD      . LORazepam (ATIVAN) tablet 0.5 mg  0.5 mg Oral Q4H PRN Jacques Navy, MD   0.5 mg at 05/04/13 1851  . metoprolol succinate (TOPROL-XL) 24 hr tablet 75 mg  75 mg Oral q morning - 10a Jacques Navy, MD      . mometasone-formoterol [facility]) 200-5 MCG/ACT inhaler 2 puff  2 puff Inhalation BID Jacques Navy, MD      . ondansetron [facility]) tablet 4 mg  4 mg Oral Q6H PRN Jacques Navy, MD       Or  . ondansetron [facility]) injection 4 mg  4 mg Intravenous Q6H PRN Jacques Navy, MD      . oxybutynin (DITROPAN-XL) 24 hr tablet 5 mg  5 mg Oral Daily Jacques Navy, MD      . Melene Muller ON 05/05/2013] pantoprazole (PROTONIX) EC tablet 80 mg  80 mg Oral Q1200 Jacques Navy, MD      . potassium chloride SA (K-DUR,KLOR-CON) CR tablet 20 mEq  20 mEq Oral Daily Jacques Navy, MD      . Melene Muller ON 05/05/2013] saccharomyces boulardii (FLORASTOR) capsule 250 mg  250 mg Oral Daily Jacques Navy, MD      . vancomycin (VANCOCIN) IVPB 1000 mg/200 mL premix  1,000 mg Intravenous Q12H Otho Bellows, RPH   1,000 mg at 05/04/13 1328       Allergies  Allergen Reactions  . Amoxicillin     REACTION: rash  . Penicillins Rash  :  Family History  Problem Relation Age of Onset  . Coronary artery disease Father   . Heart attack Father   . Cancer Brother     colon  . Other Other     TB  :  History   Social History  .  Marital Status: Widowed    Spouse Name: N/A    Number of Children: 3  .  Years of Education: 56   Occupational History  . newspaper Programmer, multimedia     [employer]   Social History Main Topics  . Smoking status: Former Smoker    Types: Cigarettes    Quit date: 06/24/1978  . Smokeless tobacco: Never Used  . Alcohol Use: 6.7 oz/week    7 Glasses of wine, 5 Drinks containing 0.5 oz of alcohol per week     Comment: Currently only drinks wine - in moderation. He is cutting back-past hx. ETOH abuse  . Drug Use: No  . Sexual Activity: Yes   Other Topics Concern  . Not on file   Social History Narrative   [employer] [place]. married '56- widowed '07. 1 son, 2 daughters, 4 grandchildren   work: former Counsellor; very active in civic affairs. lives alone: completed  [place] continues to [place] improvement ('12), keeps up his beach house; takes his extended family on great trips - [place] and [place] July '12. Socially active.  Active in AA      End of Life issues: He does want CPR; no prolonged intubation; no futile or heroic measure to maintain him if the quality of life isn't good. HCPOA  Son - Arjuna Doeden (c) [phone].  :  A comprehensive review of systems was negative.  Exam: Blood pressure 113/57, pulse 75, temperature 97.9 F (36.6 C), temperature source Oral, resp. rate 18, height 5\' 11"  (1.803 m), weight 192 lb (87.091 kg), SpO2 99.00%. General appearance: alert, cooperative and appears stated age Head: Normocephalic, without obvious abnormality, atraumatic Eyes: conjunctivae/corneas clear. PERRL, EOM's intact. Fundi benign. Nose: Nares normal. Septum midline. Mucosa normal. No drainage or sinus tenderness. Throat: lips, mucosa, and tongue normal; teeth and gums normal Neck: no adenopathy, no carotid bruit, no JVD, supple, symmetrical, trachea midline and thyroid not enlarged, symmetric, no tenderness/mass/nodules Resp: clear to  auscultation bilaterally Chest wall: no tenderness Cardio: regular rate and rhythm, S1, S2 normal, no murmur, click, rub or gallop GI: soft, non-tender; bowel sounds normal; no masses,  no organomegaly Extremities: edema Left >>right up to the thigh.    Recent Labs  05/04/13 1200  WBC 9.7  HGB 11.1*  HCT 34.3*  PLT 255    Recent Labs  05/04/13 1200  NA 130*  K 4.7  CL 93*  CO2 27  GLUCOSE 109*  BUN 10  CREATININE 0.80  CALCIUM 9.8     Ct Angio Chest W/cm &/or Wo Cm  04/27/2013   CLINICAL DATA:  Status post knee surgery, elevated D-dimer and short of breath - evaluate for pulmonary embolus  EXAM: CT ANGIOGRAPHY CHEST WITH CONTRAST  TECHNIQUE: Multidetector CT imaging of the chest was performed using the standard protocol during bolus administration of intravenous contrast. Multiplanar CT image reconstructions including MIPs were obtained to evaluate the vascular anatomy.  CONTRAST:  75mL OMNIPAQUE IOHEXOL 350 MG/ML SOLN  COMPARISON:  Prior chest x-ray 01/26/2013; the most recent prior PET-CT 03/05/2011  FINDINGS: Mediastinum:  Unremarkable CT appearance of the thyroid gland. No suspicious mediastinal or hilar adenopathy. No soft tissue mediastinal mass. The thoracic esophagus is unremarkable.  Heart/Vascular: Adequate opacification of the pulmonary arteries to the proximal subsegmental level. No acute filling defect to suggest pulmonary embolus. Scattered atherosclerotic vascular calcifications throughout the thoracic aorta and coronary arteries. Mild cardiomegaly. No pericardial effusion. No aortic aneurysm or dissection.  Lungs/Pleura: No pleural effusion. Mild centrilobular emphysema. Calcified granuloma in the left upper lobe. Nonspecific 5 mm nodular opacity in the  right middle lobe (image 51 and 52 of series 11). Mild dependent atelectasis in the bilateral lower lobes. Small noncalcified nodular opacities in the inferomedial left lower lobe. No focal airspace consolidation or  pulmonary edema.  Bones/Soft Tissues: No acute fracture or aggressive appearing lytic or blastic osseous lesion. a the bilateral glenohumeral joint osteoarthritis. Healed left 7th rib fracture.  Upper Abdomen: Numerous punctate calcifications throughout the visualized splenic and hepatic parenchyma consistent with sequelae of old granulomatous disease. Otherwise, visualized upper abdomen is unremarkable.  Review of the MIP images confirms the above findings.  IMPRESSION: 1. Negative for acute pulmonary embolus, pneumonia or other acute cardiopulmonary process. 2. Atherosclerosis including coronary artery disease. 3. Mild centrilobular emphysema. 4. Sequelae of old granulomatous disease involving the spleen, liver and lungs.   Electronically Signed   By: Malachy Moan M.D.   On: 04/27/2013 19:46   Ct Abdomen Pelvis W Contrast  05/04/2013   CLINICAL DATA:  Left lower quadrant pain. Left lower extremity edema. Question diverticulitis. Question extrinsic compression. History of lymphoma.  EXAM: CT ABDOMEN AND PELVIS WITH CONTRAST  TECHNIQUE: Multidetector CT imaging of the abdomen and pelvis was performed using the standard protocol following bolus administration of intravenous contrast.  CONTRAST:  OMNIPAQUE IOHEXOL 300 MG/ML  SOLN  COMPARISON:  03/05/2011 PET CT.  FINDINGS: Interval development of prominent necrotic-appearing adenopathy. Adenopathy is largest in the left external iliac region measuring up to 6.7 cm. The left external iliac artery traverses through adenopathy. The left external iliac vein is compressed and may be occluded. The left common iliac vein is narrowed.  Adenopathy noted in the left operator region, left common iliac region, at the level of the aortic bifurcation, left periaortic and inter aortic caval region as well as the anterior to the inferior vena cava. Adenopathy invades the left psoas muscle.  Posterior to the left psoas muscle 3.4 cm lesion consistent with lymphoma.   Enlargement upper left thigh with stranding of fat planes consistent with poor venous return caused by the adenopathy.  Atherosclerotic type changes of the abdominal aorta with ectasia of the abdominal aorta. Lower abdominal aorta surrounded by adenopathy.  Prominent narrowing origin of the common iliac arteries more notable on the left. Ectatic common iliac arteries.  Fatty infiltration of the liver without focal worrisome mass. Calcified granulomas throughout the liver and spleen.  2 cm left renal cyst. No hydronephrosis. No adrenal or pancreatic mass.  The gallbladder is full without evidence of a calcified gallstone.  Diverticular with mild muscular hypertrophy sigmoid colon without extra luminal bowel inflammatory process, free fluid or free air.  Partially decompressed urinary bladder without gross abnormality. Impression upon the bladder base by prostate gland which is partially calcified. Correlation recommended.  Degenerative changes lumbar spine. Bilateral femoral head avascular necrosis greater on the right. Right posterior ilium unchanged from a head CT. Sacroiliac joint degenerative changes.  IMPRESSION: Interval development of prominent necrotic-appearing adenopathy. Adenopathy is largest in the left external iliac region measuring up to 6.7 cm. The left external iliac artery traverses through adenopathy. The left external iliac vein is compressed and may be occluded. The left common iliac vein is narrowed.  Adenopathy noted in the left operator region, left common iliac region, at the level of the aortic bifurcation, left periaortic and inter aortic caval region as well as the anterior to the inferior vena cava. Adenopathy invades the left psoas muscle.  Posterior to the left psoas muscle 3.4 cm lesion consistent with lymphoma.  Enlargement upper  left thigh with stranding of fat planes consistent with poor venous return caused by the adenopathy.  Atherosclerotic type changes of the abdominal aorta.  Prominent narrowing origin of the common iliac arteries more notable on the left. Ectatic common iliac arteries.  Diverticular with mild muscular hypertrophy sigmoid colon without extra luminal bowel inflammatory process, free fluid or free air.  Impression upon the bladder base by prostate gland which is partially calcified. Correlation recommended.  Degenerative changes lumbar spine. Bilateral femoral head avascular necrosis greater on the right.  These results will be called to the ordering clinician or representative by the Radiologist Assistant, and communication documented in the PACS Dashboard.   Electronically Signed   By: Bridgett Larsson M.D.   On: 05/04/2013 16:33    Assessment and Plan:  79 year old with the following issues:  1. Diffuse abdominal and pelvic adenopathy. This is Likely represent relapsed lymphoma.  Other cancer are possibility but less likely as I see no evidence of solid tumor primary. I discussed with salvage treatment with Bendamustine and rituxan. Risks and benefits discussed in details and he is agreeable to proceeded. We will start on 11/12 given the urgency of his symptoms.  The goal is to get him into remission at this point.   2. Left leg edema: due lymphatic obstruction. If chemotherapy has no robust response, then will biopsy and proceed with radiation.     3. Cellulitis: I agree with broad spectrum antibiotics for now.

## 2013-05-04 NOTE — Progress Notes (Signed)
Radiologist called to notify of CT scan reports; notified Dr. Debby Bud, will come and speak to patient later today

## 2013-05-05 DIAGNOSIS — J438 Other emphysema: Secondary | ICD-10-CM

## 2013-05-05 DIAGNOSIS — K7030 Alcoholic cirrhosis of liver without ascites: Secondary | ICD-10-CM

## 2013-05-05 MED ORDER — HEPARIN SOD (PORK) LOCK FLUSH 100 UNIT/ML IV SOLN: 250.0000 [IU] | Freq: Once | INTRAVENOUS | Status: AC | PRN

## 2013-05-05 MED ORDER — EPINEPHRINE HCL 0.1 MG/ML IJ SOLN
0.2500 mg | Freq: Once | INTRAMUSCULAR | Status: AC | PRN
  Filled 2013-05-05: qty 2.5

## 2013-05-05 MED ORDER — SODIUM CHLORIDE 0.9 % IV SOLN
90.0000 mg/m2 | Freq: Once | INTRAVENOUS | Status: AC
  Administered 2013-05-07: 190 mg via INTRAVENOUS
  Filled 2013-05-05 (×3): qty 38

## 2013-05-05 MED ORDER — ACETAMINOPHEN 325 MG PO TABS: 650.0000 mg | ORAL_TABLET | Freq: Once | ORAL | Status: DC

## 2013-05-05 MED ORDER — ALBUTEROL SULFATE (5 MG/ML) 0.5% IN NEBU: 2.5000 mg | INHALATION_SOLUTION | Freq: Once | RESPIRATORY_TRACT | Status: AC | PRN

## 2013-05-05 MED ORDER — SODIUM CHLORIDE 0.9 % IV SOLN
Freq: Once | INTRAVENOUS | Status: AC
  Administered 2013-05-07: 02:00:00 via INTRAVENOUS

## 2013-05-05 MED ORDER — OXYCODONE-ACETAMINOPHEN 5-325 MG PO TABS
2.0000 | ORAL_TABLET | Freq: Three times a day (TID) | ORAL | Status: DC | PRN
  Administered 2013-05-05 – 2013-05-06 (×3): 2 via ORAL
  Filled 2013-05-05 (×3): qty 2

## 2013-05-05 MED ORDER — SODIUM CHLORIDE 0.9 % IJ SOLN
10.0000 mL | Freq: Two times a day (BID) | INTRAMUSCULAR | Status: DC
  Administered 2013-05-06 – 2013-05-07 (×3): 10 mL

## 2013-05-05 MED ORDER — EPINEPHRINE HCL 1 MG/ML IJ SOLN
0.5000 mg | Freq: Once | INTRAMUSCULAR | Status: AC | PRN
  Filled 2013-05-05: qty 1

## 2013-05-05 MED ORDER — ALTEPLASE 2 MG IJ SOLR
2.0000 mg | Freq: Once | INTRAMUSCULAR | Status: AC | PRN
  Filled 2013-05-05: qty 2

## 2013-05-05 MED ORDER — SODIUM CHLORIDE 0.9 % IJ SOLN: 10.0000 mL | INTRAMUSCULAR | Status: DC | PRN

## 2013-05-05 MED ORDER — HEPARIN SOD (PORK) LOCK FLUSH 100 UNIT/ML IV SOLN: 500.0000 [IU] | Freq: Once | INTRAVENOUS | Status: AC | PRN

## 2013-05-05 MED ORDER — SODIUM CHLORIDE 0.9 % IV SOLN
375.0000 mg/m2 | Freq: Once | INTRAVENOUS | Status: AC
  Administered 2013-05-06: 800 mg via INTRAVENOUS
  Filled 2013-05-05 (×2): qty 80

## 2013-05-05 MED ORDER — DIPHENHYDRAMINE HCL 50 MG/ML IJ SOLN: 25.0000 mg | Freq: Once | INTRAMUSCULAR | Status: AC | PRN

## 2013-05-05 MED ORDER — ACETAMINOPHEN 325 MG PO TABS
650.0000 mg | ORAL_TABLET | Freq: Once | ORAL | Status: AC
  Administered 2013-05-06: 650 mg via ORAL
  Filled 2013-05-05: qty 2

## 2013-05-05 MED ORDER — BOOST / RESOURCE BREEZE PO LIQD
1.0000 | Freq: Two times a day (BID) | ORAL | Status: DC
  Administered 2013-05-05 – 2013-05-08 (×4): 1 via ORAL

## 2013-05-05 MED ORDER — SODIUM CHLORIDE 0.9 % IV SOLN
Freq: Once | INTRAVENOUS | Status: AC
  Administered 2013-05-07: 18 mg via INTRAVENOUS
  Filled 2013-05-05 (×2): qty 4

## 2013-05-05 MED ORDER — DIPHENHYDRAMINE HCL 50 MG PO CAPS
50.0000 mg | ORAL_CAPSULE | Freq: Once | ORAL | Status: AC
  Administered 2013-05-06: 50 mg via ORAL
  Filled 2013-05-05: qty 1

## 2013-05-05 MED ORDER — METHYLPREDNISOLONE SODIUM SUCC 125 MG IJ SOLR
125.0000 mg | Freq: Once | INTRAMUSCULAR | Status: AC | PRN
  Filled 2013-05-05: qty 2

## 2013-05-05 MED ORDER — DIPHENHYDRAMINE HCL 50 MG/ML IJ SOLN: 50.0000 mg | Freq: Once | INTRAMUSCULAR | Status: AC | PRN

## 2013-05-05 MED ORDER — FAMOTIDINE IN NACL 20-0.9 MG/50ML-% IV SOLN
20.0000 mg | Freq: Once | INTRAVENOUS | Status: AC | PRN
  Filled 2013-05-05: qty 50

## 2013-05-05 MED ORDER — SODIUM CHLORIDE 0.9 % IJ SOLN: 3.0000 mL | INTRAMUSCULAR | Status: DC | PRN

## 2013-05-05 MED ORDER — SODIUM CHLORIDE 0.9 % IV SOLN: Freq: Once | INTRAVENOUS | Status: AC | PRN

## 2013-05-05 NOTE — Progress Notes (Signed)
Peripherally Inserted Central Catheter/Midline Placement  The IV Nurse has discussed with the patient and/or persons authorized to consent for the patient, the purpose of this procedure and the potential benefits and risks involved with this procedure.  The benefits include less needle sticks, lab draws from the catheter and patient may be discharged home with the catheter.  Risks include, but not limited to, infection, bleeding, blood clot (thrombus formation), and puncture of an artery; nerve damage and irregular heat beat.  Alternatives to this procedure were also discussed.  PICC/Midline Placement Documentation        Jack [name] 05/05/2013, 3:59 PM

## 2013-05-05 NOTE — Progress Notes (Signed)
PT Cancellation Note  Patient Details Name: Jack [name] MRN: [id number] DOB: [birth year]   Cancelled Treatment:    Reason Eval/Treat Not Completed: Pt declined to participate today. Will check back tomorrow.  Thanks.    Rebeca Alert, MPT Pager: [phone]

## 2013-05-05 NOTE — Progress Notes (Signed)
PT Cancellation Note  Patient Details Name: GUSTAVO DISPENZA MRN: [id number] DOB: [birth year]   Cancelled Treatment:    Reason Eval/Treat Not Completed: Patient at procedure or test/unavailable--pt/family state they are waiting for pt to go to chemo at any moment. Requested PT check back later or tomorrow. Will check back as schedule permits. Thanks.    Rebeca Alert, MPT Pager: [phone]

## 2013-05-05 NOTE — Progress Notes (Signed)
Patient was medicated at 1300 with 2 Vicodin. Daughters approached me in the hall to let me know that in addition to the medication that I had already given the patient, he took another one of his own Oxycodone 10/325 pills. Patient and daughters educated that that could not happen. Patient's medications needed to be taken home or otherwise we could send them to the pharmacy. Patient and family all verbalized understanding. Patient will continue to be monitored. Physician will be notified and see if medication can be switched from Vicodin to Oxycodone as patient feels its more effective.

## 2013-05-05 NOTE — Progress Notes (Signed)
  IP PROGRESS NOTE  Subjective:   Patient is doing well. No new complaints and ready to proceed with chemotherapy.   Objective:  Vital signs in last 24 hours: Temp:  [97.8 F (36.6 C)-98 F (36.7 C)] 97.8 F (36.6 C) (11/12 0403) Pulse Rate:  [75-84] 81 (11/12 0403) Resp:  [16-18] 16 (11/12 0403) BP: (113-133)/(57-59) 133/59 mmHg (11/12 0403) SpO2:  [97 %-100 %] 97 % (11/12 0403) Weight:  [192 lb (87.091 kg)] 192 lb (87.091 kg) (11/11 1132) Weight change:  Last BM Date: 05/04/13  Intake/Output from previous day: 11/11 0701 - 11/12 0700 In: 300 [P.O.:300] Out: 700 [Urine:700]  Mouth: mucous membranes moist, pharynx normal without lesions Resp: clear to auscultation bilaterally Cardio: regular rate and rhythm, S1, S2 normal, no murmur, click, rub or gallop GI: soft, non-tender; bowel sounds normal; no masses,  no organomegaly Extremities: extremities normal, atraumatic, no cyanosis or edema    Lab Results:  Recent Labs  05/04/13 1200  WBC 9.7  HGB 11.1*  HCT 34.3*  PLT 255    BMET  Recent Labs  05/04/13 1200  NA 130*  K 4.7  CL 93*  CO2 27  GLUCOSE 109*  BUN 10  CREATININE 0.80  CALCIUM 9.8     Medications: I have reviewed the patient's current medications.  Assessment/Plan:  79 year old with the following issues:   1. Diffuse abdominal and pelvic adenopathy. This is Likely represent relapsed lymphoma. He will start chemotherapy today with day one of Rituxan and bendamustine. Tomorrow, he will have bendamustine only.   2. Left leg edema: due lymphatic obstruction. If chemotherapy has no robust response, then will biopsy and proceed with radiation.   3. Cellulitis: I agree with broad spectrum antibiotics for now.      LOS: 1 day   [facility] 05/05/2013, 7:35 AM

## 2013-05-05 NOTE — Progress Notes (Signed)
Utilization review completed.  

## 2013-05-05 NOTE — Care Management Note (Signed)
   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     Action/Plan:   Home when medically stable   Anticipated DC Date:     Anticipated DC Plan:  HOME W HOME HEALTH SERVICES      DC Planning Services  CM consult      Choice offered to / List presented to:  NA   DME arranged  NA      DME agency  NA     HH arranged  HH-2 PT      [facility] agency  [facility]   Status of service:  In process, will continue to follow Medicare Important Message given?   (If response is "NO", the following Medicare IM given date fields will be blank) Date Medicare IM given:   Date Additional Medicare IM given:    Discharge Disposition:    Per UR Regulation:  Reviewed for med. necessity/level of care/duration of stay  If discussed at Long Length of Stay Meetings, dates discussed:    Comments:  05/05/13 1057 Liani Caris,RN,MSN [phone] Chart reviewed for utilization of services. Pt active with [facility] for [facility] services. Pt will require resumtpion of care orders at time of discharge. No other needs identified at this time. PCP: Dr. Debby Bud

## 2013-05-05 NOTE — Progress Notes (Signed)
Subjective: Sitting up and enjoying the company of his family and lunch. No pain in the leg. Does admit to some DOE.  Objective: Lab:  Recent Labs  05/04/13 1200  WBC 9.7  NEUTROABS 6.5  HGB 11.1*  HCT 34.3*  MCV 89.6  PLT 255    Recent Labs  05/04/13 1200  NA 130*  K 4.7  CL 93*  GLUCOSE 109*  BUN 10  CREATININE 0.80  CALCIUM 9.8    Imaging:  Scheduled Meds: . sodium chloride   Intravenous Once  . acetaminophen  650 mg Oral Once  . alfuzosin  10 mg Oral q morning - 10a  . aspirin EC  81 mg Oral Daily  . bendamustine (TREANDA) CHEMO IV infusion  90 mg/m2 (Treatment Plan Actual) Intravenous Once  . ceFEPime (MAXIPIME) IV  1 g Intravenous Q8H  . diphenhydrAMINE  50 mg Oral Once  . enoxaparin (LOVENOX) injection  40 mg Subcutaneous Q24H  . metoprolol succinate  75 mg Oral q morning - 10a  . mometasone-formoterol  2 puff Inhalation BID  . ondansetron (ZOFRAN) with dexamethasone (DECADRON) IV   Intravenous Once  . oxybutynin  5 mg Oral Daily  . pantoprazole  80 mg Oral Q1200  . potassium chloride SA  20 mEq Oral Daily  . riTUXimab (RITUXAN) IV infusion  375 mg/m2 (Treatment Plan Actual) Intravenous Once  . saccharomyces boulardii  250 mg Oral Daily  . vancomycin  1,000 mg Intravenous Q12H   Continuous Infusions: . sodium chloride 50 mL/hr (05/05/13 1200)   PRN Meds:.sodium chloride, acetaminophen, acetaminophen, albuterol, albuterol, albuterol, alteplase, diphenhydrAMINE, diphenhydrAMINE, EPINEPHrine, EPINEPHrine, EPINEPHrine, EPINEPHrine, famotidine, heparin lock flush, heparin lock flush, HYDROcodone-acetaminophen, HYDROmorphone (DILAUDID) injection, LORazepam, methylPREDNISolone sodium succinate, ondansetron (ZOFRAN) IV, ondansetron, sodium chloride, sodium chloride   Physical Exam: Filed Vitals:   05/05/13 0403  BP: 133/59  Pulse: 81  Temp: 97.8 F (36.6 C)  Resp: 16   gen'l- elderly man in no distress Cor- RRR Pulm - normal respirations, no rales or  wheezes Ext - left leg remains edematous Derm - left leg remains red and warm.     Assessment/Plan: 1. Oncology - recurrent lymphoma is the working diagnosis with extrinsic compression of the major pelvic veins causing edema. Lymphadenopathy invading the psoas is the most likely source of abdominal pain.  Plan Per [name]: retuxin and Kathi Der to be initiated today.   PICC line for longer term admission of drugs  2. ID- initial concern for cellulitis. Day #2 Vanc/cefepime. No fever, no leukocytosis. Leg is less red and warm.  Plan Continue present IV antibiotics  Recheck CBC  Hopefully home on oral meds or none  3. Ortho - recent right TKR - stable  4. Pulm - underlying COPD/emphysema. Reviewed the use of maintenance medications (advair or symbicort) and rescue medication.  Plan  Continue both classes of meds  DOE is not the same as wheezing and he is instructed as to the difference.  Dispo - home hopefully 05/07/13   Illene Regulus [facility] (o) [phone]; (c) [phone] Call-grp - Patsi Sears [facility]  Tele: [phone]  05/05/2013, 12:43 PM

## 2013-05-05 NOTE — Progress Notes (Signed)
INITIAL NUTRITION ASSESSMENT  DOCUMENTATION CODES Per approved criteria  -Not Applicable   INTERVENTION: - Resource Breeze BID - Encouraged increased meal intake - Will continue to monitor   NUTRITION DIAGNOSIS: Increased nutrient needs related to possible relapsed lymphoma with chemotherapy today and tomorrow as evidenced by MD notes.   Goal: Pt to consume >90% of meals/supplements  Monitor:  Weights, labs, intake  Reason for Assessment: Nutrition risk   79 y.o. male  Admitting Dx: Severe lower abdominal pain with progressive left lower extremity swelling  ASSESSMENT: Admitted with severe lower abdominal pain with progressive left lower extremity swelling. Hx of GERD, alcohol abuse, cirrhosis, and non-Hodgkin's lymphoma. Found to have diffuse abdominal and pelvic adenopathy likely relapsed lymphoma per oncologist's notes. Plan is for chemotherapy today with day 1 of Rituxan and bendamustine and tomorrow getting bendamustine only.   Met with pt who reports having knee replacement 03/22/13 and has had poor appetite since then with 12 pound unintended weight loss. Daughter reports pt has been eating about 1/3 of his meals since then. Not on any nutritional supplements at home. Reports his strength has been good. Pt reports eating some of his biscuit and fruit cup this morning. Interested in Futures trader for additional nutrition. Temporal/orbital, clavicle, upper arm, and hand region all WNL for muscle strength.   Height: Ht Readings from Last 1 Encounters:  05/04/13 5\' 11"  (1.803 m)    Weight: Wt Readings from Last 1 Encounters:  05/04/13 192 lb (87.091 kg)    Ideal Body Weight: 172 lb  % Ideal Body Weight: 112%  Wt Readings from Last 10 Encounters:  05/04/13 192 lb (87.091 kg)  04/27/13 193 lb (87.544 kg)  04/22/13 189 lb 14.4 oz (86.138 kg)  04/09/13 192 lb 12.8 oz (87.454 kg)  03/23/13 197 lb (89.359 kg)  03/23/13 197 lb (89.359 kg)  03/17/13 197 lb 11.2  oz (89.676 kg)  01/26/13 199 lb 12.8 oz (90.629 kg)  12/31/12 202 lb (91.627 kg)  12/08/12 200 lb (90.719 kg)    Usual Body Weight: 204 lb per pt  % Usual Body Weight: 94%  BMI:  Body mass index is 26.79 kg/(m^2).  Estimated Nutritional Needs: Kcal: 1950-2150 Protein: 90-105g Fluid: 1.9-2.1L/day  Skin: +3 LLE edema  Diet Order: General  EDUCATION NEEDS: -No education needs identified at this time   Intake/Output Summary (Last 24 hours) at 05/05/13 1249 Last data filed at 05/05/13 0932  Gross per 24 hour  Intake    540 ml  Output    901 ml  Net   -361 ml    Last BM: 11/12  Labs:   Recent Labs Lab 05/04/13 1200  NA 130*  K 4.7  CL 93*  CO2 27  BUN 10  CREATININE 0.80  CALCIUM 9.8  GLUCOSE 109*    CBG (last 3)  No results found for this basename: GLUCAP,  in the last 72 hours  Scheduled Meds: . sodium chloride   Intravenous Once  . acetaminophen  650 mg Oral Once  . alfuzosin  10 mg Oral q morning - 10a  . aspirin EC  81 mg Oral Daily  . bendamustine (TREANDA) CHEMO IV infusion  90 mg/m2 (Treatment Plan Actual) Intravenous Once  . ceFEPime (MAXIPIME) IV  1 g Intravenous Q8H  . diphenhydrAMINE  50 mg Oral Once  . enoxaparin (LOVENOX) injection  40 mg Subcutaneous Q24H  . metoprolol succinate  75 mg Oral q morning - 10a  . mometasone-formoterol  2 puff  Inhalation BID  . ondansetron (ZOFRAN) with dexamethasone (DECADRON) IV   Intravenous Once  . oxybutynin  5 mg Oral Daily  . pantoprazole  80 mg Oral Q1200  . potassium chloride SA  20 mEq Oral Daily  . riTUXimab (RITUXAN) IV infusion  375 mg/m2 (Treatment Plan Actual) Intravenous Once  . saccharomyces boulardii  250 mg Oral Daily  . vancomycin  1,000 mg Intravenous Q12H    Continuous Infusions: . sodium chloride 50 mL/hr (05/05/13 1200)    Past Medical History  Diagnosis Date  . GERD (gastroesophageal reflux disease)   . Osteoarthritis   . Alcohol abuse     in recovery 2006  . Cirrhosis  with alcoholism 03-10-12    stable, recovered ETOH abuse, rare occ. wine only  . Irritable bowel   . Hyperlipidemia   . Insomnia   . PAC (premature atrial contraction)     stable (ruled out for a fib)  . Prostatitis     chronic bacterial  . Pseudogout   . Sinus problem     chronic  . Cellulitis     2nd to T. Pedis(not at present)  . Hearing loss 03-10-12    bilateral hearing aids  . Complication of anesthesia 03-10-12    after shoulder  surgery -difficulty awakening,breathing problems  . Emphysema 03-10-12    tx. inhalers, uses steam room frequently  . Cramping of feet 03-10-12    cramping of both legs and hands occ.  . Non Hodgkin's lymphoma 03-10-12     '07-1 yr. in remission([facility])-not seeing now  . Osteoarthritis of left knee 03/17/2012  . Glaucoma   . Macular degeneration   . Hypertension   . Shortness of breath     Past Surgical History  Procedure Laterality Date  . Esophagogastroduodenoscopy  01-27-02  . Inguinal hernia repair  1979  . Torn biceps repair  03-10-12    Left rotator cuff repair  . Hernia repair    . Cataract surgery  03-10-12    03-04-12(right)/ (03-11-12-left)  . Total knee arthroplasty  03/17/2012    Procedure: TOTAL KNEE ARTHROPLASTY;  Surgeon: Eulas Post, MD;  Location: [facility];  Service: Orthopedics;  Laterality: Left;  Marland Kitchen Eye surgery  2014    cataract extraction with IOL both eyes staged  . Colonoscopy    . Rotator cuff repair Left   . Total knee arthroplasty Right 03/22/2013    Procedure: TOTAL KNEE ARTHROPLASTY;  Surgeon: Eulas Post, MD;  Location: [facility];  Service: Orthopedics;  Laterality: Right;  righ total knee arthroplasty    Levon Hedger MS, RD, LDN [phone] Pager [phone] After Hours Pager

## 2013-05-06 DIAGNOSIS — G4700 Insomnia, unspecified: Secondary | ICD-10-CM

## 2013-05-06 DIAGNOSIS — L0291 Cutaneous abscess, unspecified: Secondary | ICD-10-CM

## 2013-05-06 DIAGNOSIS — C8589 Other specified types of non-Hodgkin lymphoma, extranodal and solid organ sites: Secondary | ICD-10-CM

## 2013-05-06 LAB — CBC WITH DIFFERENTIAL/PLATELET
Basophils Absolute: 0 10*3/uL (ref 0.0–0.1)
Basophils Relative: 1 % (ref 0–1)
Eosinophils Absolute: 0.2 10*3/uL (ref 0.0–0.7)
Eosinophils Relative: 3 % (ref 0–5)
Lymphocytes Relative: 23 % (ref 12–46)
Lymphs Abs: 1.4 10*3/uL (ref 0.7–4.0)
MCHC: 33.8 g/dL (ref 30.0–36.0)
MCV: 87.6 fL (ref 78.0–100.0)
Neutro Abs: 3.8 10*3/uL (ref 1.7–7.7)
Neutrophils Relative %: 61 % (ref 43–77)
Platelets: 203 10*3/uL (ref 150–400)
RBC: 3.38 MIL/uL — ABNORMAL LOW (ref 4.22–5.81)
RDW: 14.6 % (ref 11.5–15.5)
WBC: 6.2 10*3/uL (ref 4.0–10.5)

## 2013-05-06 LAB — BASIC METABOLIC PANEL
CO2: 25 mEq/L (ref 19–32)
GFR calc non Af Amer: 82 mL/min — ABNORMAL LOW (ref >90)
Glucose, Bld: 123 mg/dL — ABNORMAL HIGH (ref 70–99)
Potassium: 4.3 mEq/L (ref 3.5–5.1)
Sodium: 132 mEq/L — ABNORMAL LOW (ref 135–145)

## 2013-05-06 MED ORDER — RACEPINEPHRINE HCL 2.25 % IN NEBU
0.5000 mL | INHALATION_SOLUTION | Freq: Once | RESPIRATORY_TRACT | Status: AC
  Administered 2013-05-06: 0.5 mL via RESPIRATORY_TRACT

## 2013-05-06 MED ORDER — DIPHENHYDRAMINE HCL 50 MG/ML IJ SOLN
50.0000 mg | Freq: Once | INTRAMUSCULAR | Status: AC
  Administered 2013-05-06: 50 mg via INTRAVENOUS

## 2013-05-06 MED ORDER — EPINEPHRINE HCL 0.1 MG/ML IJ SOLN
0.2500 mg | Freq: Once | INTRAMUSCULAR | Status: AC | PRN
  Filled 2013-05-06: qty 2.5

## 2013-05-06 MED ORDER — FAMOTIDINE IN NACL 20-0.9 MG/50ML-% IV SOLN
20.0000 mg | Freq: Once | INTRAVENOUS | Status: AC | PRN
  Administered 2013-05-06: 20 mg via INTRAVENOUS
  Filled 2013-05-06: qty 50

## 2013-05-06 MED ORDER — DIPHENHYDRAMINE HCL 50 MG PO CAPS: 50.0000 mg | ORAL_CAPSULE | Freq: Once | ORAL | Status: DC

## 2013-05-06 MED ORDER — METHYLPREDNISOLONE SODIUM SUCC 125 MG IJ SOLR
125.0000 mg | Freq: Once | INTRAMUSCULAR | Status: AC | PRN
  Administered 2013-05-06: 125 mg via INTRAVENOUS
  Filled 2013-05-06: qty 2

## 2013-05-06 MED ORDER — RACEPINEPHRINE HCL 2.25 % IN NEBU
0.5000 mL | INHALATION_SOLUTION | Freq: Once | RESPIRATORY_TRACT | Status: AC
  Filled 2013-05-06: qty 0.5

## 2013-05-06 MED ORDER — ALBUTEROL SULFATE (5 MG/ML) 0.5% IN NEBU
2.5000 mg | INHALATION_SOLUTION | Freq: Once | RESPIRATORY_TRACT | Status: AC | PRN
  Administered 2013-05-06: 2.5 mg via RESPIRATORY_TRACT

## 2013-05-06 MED ORDER — EPINEPHRINE HCL 1 MG/ML IJ SOLN
0.5000 mg | Freq: Once | INTRAMUSCULAR | Status: AC
  Administered 2013-05-06: 0.5 mg via SUBCUTANEOUS

## 2013-05-06 MED ORDER — ACETAMINOPHEN 325 MG PO TABS
650.0000 mg | ORAL_TABLET | Freq: Once | ORAL | Status: AC
  Administered 2013-05-06: 650 mg via ORAL
  Filled 2013-05-06: qty 2

## 2013-05-06 NOTE — Progress Notes (Signed)
PT Cancellation Note  Patient Details Name: CHASEN MENDELL MRN: [id number] DOB: Nov 10, 1928   Cancelled Treatment:    Reason Eval/Treat Not Completed: PT screened, no needs identified, will sign off (pt/family report pt up ad lib w/ RW, no PT needs at this time)   Rada Hay 05/06/2013, 8:29 AM Blanchard Kelch PT [phone]

## 2013-05-06 NOTE — Progress Notes (Signed)
Subjective: Slept well. Pain controlled with pecocet. Tolerating Rutxin infusion.  Objective: Lab:  Recent Labs  05/04/13 1200 05/06/13 0545  WBC 9.7 6.2  NEUTROABS 6.5 3.8  HGB 11.1* 10.0*  HCT 34.3* 29.6*  MCV 89.6 87.6  PLT 255 203    Recent Labs  05/04/13 1200 05/06/13 0545  NA 130* 132*  K 4.7 4.3  CL 93* 98  GLUCOSE 109* 123*  BUN 10 8  CREATININE 0.80 0.78  CALCIUM 9.8 9.5    Imaging:  Scheduled Meds: . sodium chloride   Intravenous Once  . alfuzosin  10 mg Oral q morning - 10a  . aspirin EC  81 mg Oral Daily  . bendamustine (TREANDA) CHEMO IV infusion  90 mg/m2 (Treatment Plan Actual) Intravenous Once  . ceFEPime (MAXIPIME) IV  1 g Intravenous Q8H  . enoxaparin (LOVENOX) injection  40 mg Subcutaneous Q24H  . feeding supplement (RESOURCE BREEZE)  1 Container Oral BID BM  . metoprolol succinate  75 mg Oral q morning - 10a  . mometasone-formoterol  2 puff Inhalation BID  . ondansetron (ZOFRAN) with dexamethasone (DECADRON) IV   Intravenous Once  . oxybutynin  5 mg Oral Daily  . pantoprazole  80 mg Oral Q1200  . potassium chloride SA  20 mEq Oral Daily  . saccharomyces boulardii  250 mg Oral Daily  . sodium chloride  10-40 mL Intracatheter Q12H  . vancomycin  1,000 mg Intravenous Q12H   Continuous Infusions: . sodium chloride 50 mL/hr (05/06/13 0726)   PRN Meds:.acetaminophen, acetaminophen, albuterol, albuterol, HYDROmorphone (DILAUDID) injection, LORazepam, ondansetron (ZOFRAN) IV, ondansetron, oxyCODONE-acetaminophen, sodium chloride, sodium chloride, sodium chloride   Physical Exam: Filed Vitals:   05/06/13 1240  BP: 99/43  Pulse: 86  Temp:   Resp: 16   In geri-chair and appears comfortable Pulm - normal respirations LLE - remains swollen with rubor and calore     Assessment/Plan: 1. Oncology - 1 day delay in starting treatment Plan Per Dr Clelia Croft - for chemo tomorrow  2. ID - doubt cellulitis at this point with normal WBC and no  change after antibiotics Plan D/c Vanc and cefepime  4. Pulm - stable.    Illene Regulus [facility] (o) [phone]; (c) [phone] Call-grp - Patsi Sears IM  Tele: [phone]  05/06/2013, 12:49 PM

## 2013-05-06 NOTE — Progress Notes (Signed)
  IP PROGRESS NOTE  Subjective:   Patient is doing well. No new complaints he is in about all of his rituximab infusion and seems to have tolerated it better in the last few hours. He did have an infusion-related reaction earlier and required interruption of his medication and treatments with steroids and H1 and H2 antihistamine blockers. He reports his breathing is a lot better at this point and ambulating without any difficulty.    Objective:  Vital signs in last 24 hours: Temp:  [97.3 F (36.3 C)-100 F (37.8 C)] 99.1 F (37.3 C) (11/13 1628) Pulse Rate:  [74-97] 90 (11/13 1538) Resp:  [16-40] 24 (11/13 1538) BP: (99-140)/(43-102) 118/79 mmHg (11/13 1538) SpO2:  [75 %-100 %] 93 % (11/13 1538) Weight:  [201 lb 8 oz (91.4 kg)] 201 lb 8 oz (91.4 kg) (11/13 0616) Weight change: 9 lb 8 oz (4.309 kg) Last BM Date: 05/05/13  Intake/Output from previous day: 11/12 0701 - 11/13 0700 In: 1260 [P.O.:1260] Out: 1701 [Urine:1700; Stool:1]  Mouth: mucous membranes moist, pharynx normal without lesions Resp: clear to auscultation bilaterally Cardio: regular rate and rhythm, S1, S2 normal, no murmur, click, rub or gallop GI: soft, non-tender; bowel sounds normal; no masses,  no organomegaly Extremities: extremities normal, atraumatic, no cyanosis or edema. He still have noticeable left leg edema up to the thigh.    Lab Results:  Recent Labs  05/04/13 1200 05/06/13 0545  WBC 9.7 6.2  HGB 11.1* 10.0*  HCT 34.3* 29.6*  PLT 255 203    BMET  Recent Labs  05/04/13 1200 05/06/13 0545  NA 130* 132*  K 4.7 4.3  CL 93* 98  CO2 27 25  GLUCOSE 109* 123*  BUN 10 8  CREATININE 0.80 0.78  CALCIUM 9.8 9.5     Medications: I have reviewed the patient's current medications.  Assessment/Plan:  79 year old with the following issues:   1. Diffuse abdominal and pelvic adenopathy. This is Likely represent relapsed lymphoma. He is on day 1 of cycle 1 of chemotherapy. He'll receive day  2 of bendamustine on 05/07/2013. Upon his discharge, we will arrange for a followup for evaluation before his second cycle of chemotherapy.  2. Left leg edema: due lymphatic obstruction. If chemotherapy has no robust response, then will biopsy and proceed with radiation.   3. Cellulitis: I agree with broad spectrum antibiotics for now.   4. IV access: He has a PICC line which can be removed upon discharge and will arrange for a Port-A-Cath prior to his second cycle of chemotherapy as an outpatient.     LOS: 2 days   [facility] 05/06/2013, 4:28 PM

## 2013-05-06 NOTE — Progress Notes (Signed)
Rituximab infusion restarted at 100mg /hr and will continue at that rate until infusion complete. Will continue to monitor the patient.

## 2013-05-06 NOTE — Progress Notes (Signed)
RRT RN called [phone] by bedside RN. Reaction to Chemo. Chemo reaction protocol already in place. Jack [name] remained SOB with wheezes with increased WOB. RT at bedside. NRB already on. Racemic Epi ordered for breathing treatment. BP 160/90's initially however, returned to 140/80's after 30 minutes. HR ST at 110's. RR rate returned to 20's from 36 and oxygen saturations remained > 98%. Bedside RN on phone with Dr Lupita Leash nurse several times during RRT call with orders received. Bedside RN and Charge remained at bedside - Pt returning to baseline, reassured to call RRT if needed again.

## 2013-05-06 NOTE — Progress Notes (Signed)
Called to room by patient's family member at approximately 1255.  Patient was receiving his Rituximab at 200mg /hr and was starting to have shortness of breath, audible wheezing and rigors.  Rituximab was stopped immediately and normal saline was maintained.  Patient's vitals obtained.  Patient was placed on oxygen at 2L/[place] but saturations dropped into the 70's.  Patient was titrated up to 100% NRB mask and saturations increased to 100%.  Hypersensitivity reaction protocol initiated and patient was given Benadryl 50mg  IV, Pepcid 20mg  IV, Solumedrol 125mg  IV and an Albuterol nebulizer was administered by Respiratory Therapy.  Patient did not respond to treatment.  Rapid Response RN notified to assess patient.  Patient was given 0.5mg  sq Epinephrine.  [name] was notified and order received for Racemic Nebulizer which was administered.  Patient's reaction slowly subsided and stopped at approximately 1340.  [name] arrived to assess patient and will hold Rituxan for now until sure that reaction is fully controlled.  Patient currently stable on 4L/[place] and sating 100% and wheezing almost fully resolved.  Rigors now stopped.  Will monitor closely.  Allayne Butcher [facility]  05/06/2013  1:58 PM

## 2013-05-07 ENCOUNTER — Other Ambulatory Visit: Payer: Self-pay | Admitting: Oncology

## 2013-05-07 ENCOUNTER — Telehealth: Payer: Self-pay | Admitting: Oncology

## 2013-05-07 DIAGNOSIS — C8589 Other specified types of non-Hodgkin lymphoma, extranodal and solid organ sites: Secondary | ICD-10-CM

## 2013-05-07 MED ORDER — SODIUM CHLORIDE 0.9 % IV SOLN: Freq: Once | INTRAVENOUS | Status: DC

## 2013-05-07 MED ORDER — HEPARIN SOD (PORK) LOCK FLUSH 100 UNIT/ML IV SOLN: 500.0000 [IU] | Freq: Once | INTRAVENOUS | Status: AC | PRN

## 2013-05-07 MED ORDER — ALTEPLASE 2 MG IJ SOLR
2.0000 mg | Freq: Once | INTRAMUSCULAR | Status: AC | PRN
  Filled 2013-05-07: qty 2

## 2013-05-07 MED ORDER — SODIUM CHLORIDE 0.9 % IV SOLN
Freq: Once | INTRAVENOUS | Status: AC
  Administered 2013-05-07: 8 mg via INTRAVENOUS
  Filled 2013-05-07: qty 4

## 2013-05-07 MED ORDER — SODIUM CHLORIDE 0.9 % IV SOLN
90.0000 mg/m2 | Freq: Once | INTRAVENOUS | Status: AC
  Administered 2013-05-07: 190 mg via INTRAVENOUS
  Filled 2013-05-07 (×2): qty 38

## 2013-05-07 MED ORDER — HEPARIN SOD (PORK) LOCK FLUSH 100 UNIT/ML IV SOLN: 250.0000 [IU] | Freq: Once | INTRAVENOUS | Status: AC | PRN

## 2013-05-07 MED ORDER — SODIUM CHLORIDE 0.9 % IJ SOLN: 10.0000 mL | INTRAMUSCULAR | Status: DC | PRN

## 2013-05-07 MED ORDER — SODIUM CHLORIDE 0.9 % IJ SOLN: 3.0000 mL | INTRAMUSCULAR | Status: DC | PRN

## 2013-05-07 NOTE — Progress Notes (Signed)
  IP PROGRESS NOTE  Subjective:   Patient is doing well and have tolerated chemotherapy well. He is to get day 2 of chemotherapy later tonight.     Objective:  Vital signs in last 24 hours: Temp:  [97.3 F (36.3 C)-100 F (37.8 C)] 98.1 F (36.7 C) (11/14 0555) Pulse Rate:  [70-99] 91 (11/14 0555) Resp:  [18-40] 18 (11/14 0555) BP: (110-140)/(53-90) 115/56 mmHg (11/14 0555) SpO2:  [93 %-100 %] 98 % (11/14 0815) Weight:  [195 lb 1.7 oz (88.5 kg)] 195 lb 1.7 oz (88.5 kg) (11/14 0555) Weight change: -6 lb 6.3 oz (-2.9 kg) Last BM Date: 05/05/13  Intake/Output from previous day: 11/13 0701 - 11/14 0700 In: 1420 [P.O.:480; I.V.:940] Out: 1100 [Urine:1100]  Mouth: mucous membranes moist, pharynx normal without lesions Resp: clear to auscultation bilaterally Cardio: regular rate and rhythm, S1, S2 normal, no murmur, click, rub or gallop GI: soft, non-tender; bowel sounds normal; no masses,  no organomegaly Extremities: extremities normal, atraumatic, no cyanosis or edema. He still have noticeable left leg edema up to the thigh but appears to be decreasing.     Lab Results:  Recent Labs  05/06/13 0545  WBC 6.2  HGB 10.0*  HCT 29.6*  PLT 203    BMET  Recent Labs  05/06/13 0545  NA 132*  K 4.3  CL 98  CO2 25  GLUCOSE 123*  BUN 8  CREATININE 0.78  CALCIUM 9.5     Medications: I have reviewed the patient's current medications.  Assessment/Plan:  79 year old with the following issues:   1. Diffuse abdominal and pelvic adenopathy. This is Likely represent relapsed lymphoma. He is on day 2 of cycle 1 of chemotherapy. Upon his discharge, we will arrange for a followup for evaluation before his second cycle of chemotherapy. My office will contact him with a date a time.   2. Left leg edema: due lymphatic obstruction. If chemotherapy has no robust response, then will biopsy and proceed with radiation.   3. Cellulitis: I agree with broad spectrum antibiotics.  Improved now.   4. IV access: He has a PICC line which can be removed upon discharge and will arrange for a Port-A-Cath prior to his second cycle of chemotherapy as an outpatient.     LOS: 3 days   Karli Wickizer 05/07/2013, 1:08 PM

## 2013-05-07 NOTE — Progress Notes (Signed)
Subjective: Chemotherapy reaction yesterday noted. This AM he is feeling well.  Objective: Lab:  Recent Labs  05/04/13 1200 05/06/13 0545  WBC 9.7 6.2  NEUTROABS 6.5 3.8  HGB 11.1* 10.0*  HCT 34.3* 29.6*  MCV 89.6 87.6  PLT 255 203    Recent Labs  05/04/13 1200 05/06/13 0545  NA 130* 132*  K 4.7 4.3  CL 93* 98  GLUCOSE 109* 123*  BUN 10 8  CREATININE 0.80 0.78  CALCIUM 9.8 9.5    Imaging:  Scheduled Meds: . alfuzosin  10 mg Oral q morning - 10a  . aspirin EC  81 mg Oral Daily  . enoxaparin (LOVENOX) injection  40 mg Subcutaneous Q24H  . feeding supplement (RESOURCE BREEZE)  1 Container Oral BID BM  . metoprolol succinate  75 mg Oral q morning - 10a  . mometasone-formoterol  2 puff Inhalation BID  . oxybutynin  5 mg Oral Daily  . pantoprazole  80 mg Oral Q1200  . potassium chloride SA  20 mEq Oral Daily  . sodium chloride  10-40 mL Intracatheter Q12H   Continuous Infusions: . sodium chloride 50 mL/hr (05/06/13 2053)   PRN Meds:.acetaminophen, acetaminophen, albuterol, albuterol, HYDROmorphone (DILAUDID) injection, LORazepam, ondansetron (ZOFRAN) IV, ondansetron, oxyCODONE-acetaminophen, sodium chloride, sodium chloride, sodium chloride   Physical Exam: Filed Vitals:   05/07/13 0555  BP: 115/56  Pulse: 91  Temp: 98.1 F (36.7 C)  Resp: 18   gen'l- sitting in a chair in no distress COr- RRR Pulm - normal respirations, no rales or wheezes Neuro - alert  Ext - noticeable decrease in swelling of left leg.     Assessment/Plan: 1. Oncology - for second dose of Travenda today  2. ID- off antibiotics. No fever.  4. Pulm - stable  Plan for d/c in AM   [employer] IM (o) [phone]; (c) [phone] Call-grp - Patsi Sears IM  Tele: [phone]  05/07/2013, 8:41 AM

## 2013-05-07 NOTE — Care Management Note (Signed)
Cm spoke with patient at the bedside with adult grand daughter present concerning discharge planning. Pt anticipates discharging home on 05/08/13. Pt currently active with [facility]. Pt has Rw and cane for home dme use. Pt will require MD orders for resumption of care at discharge.    Roxy Manns Raphel Stickles,RN,MSN [phone]

## 2013-05-07 NOTE — Progress Notes (Signed)
1st bag of Bendamustine ended around 0350am (started at 0230).  Patient was monitored in room during first 30 minutes of chemo.  Patient slept during it and had no complaints.  Will continue to monitor.Kenton Kingfisher [place]

## 2013-05-07 NOTE — Progress Notes (Signed)
Patient finished Rituximab around 0200am without any further complications.  Bendamustine dose was checked off with JC from pharmacy.  Pre-meds for Bendamustine are running in now.  Will hang Bendamustine as soon as this bag is complete.  Will continue to monitor patient.Jack [name]

## 2013-05-07 NOTE — Progress Notes (Signed)
Pt oob  Ambulating in hall. Upon return to room c/o dizziness. BP checked & found to be low. After resting pt denied dizziness & BP elevated but still low. No further c/o.

## 2013-05-07 NOTE — Telephone Encounter (Signed)
lvm for pt regarding to NOV appt.. °

## 2013-05-08 DIAGNOSIS — Z5111 Encounter for antineoplastic chemotherapy: Secondary | ICD-10-CM

## 2013-05-08 MED ORDER — ALLOPURINOL 100 MG PO TABS
100.0000 mg | ORAL_TABLET | Freq: Every day | ORAL | Status: DC
Start: 2013-05-08 — End: 2013-05-08
  Administered 2013-05-08: 100 mg via ORAL
  Filled 2013-05-08: qty 1

## 2013-05-08 MED ORDER — FAMCICLOVIR 500 MG PO TABS
250.0000 mg | ORAL_TABLET | Freq: Every day | ORAL | Status: DC
  Administered 2013-05-08: 250 mg via ORAL
  Filled 2013-05-08: qty 0.5

## 2013-05-08 MED ORDER — ALLOPURINOL 100 MG PO TABS: 100.0000 mg | ORAL_TABLET | Freq: Every day | ORAL | Status: DC

## 2013-05-08 MED ORDER — FAMCICLOVIR 500 MG PO TABS: 250.0000 mg | ORAL_TABLET | Freq: Every day | ORAL | Status: DC

## 2013-05-08 NOTE — Progress Notes (Signed)
Subjective: Doing well and ready for discharge  Objective: Lab:  Recent Labs  05/06/13 0545  WBC 6.2  NEUTROABS 3.8  HGB 10.0*  HCT 29.6*  MCV 87.6  PLT 203    Recent Labs  05/06/13 0545  NA 132*  K 4.3  CL 98  GLUCOSE 123*  BUN 8  CREATININE 0.78  CALCIUM 9.5    Imaging:  Scheduled Meds: . sodium chloride   Intravenous Once  . alfuzosin  10 mg Oral q morning - 10a  . aspirin EC  81 mg Oral Daily  . enoxaparin (LOVENOX) injection  40 mg Subcutaneous Q24H  . feeding supplement (RESOURCE BREEZE)  1 Container Oral BID BM  . metoprolol succinate  75 mg Oral q morning - 10a  . mometasone-formoterol  2 puff Inhalation BID  . oxybutynin  5 mg Oral Daily  . pantoprazole  80 mg Oral Q1200  . potassium chloride SA  20 mEq Oral Daily  . sodium chloride  10-40 mL Intracatheter Q12H   Continuous Infusions: . sodium chloride 50 mL/hr (05/07/13 1609)   PRN Meds:.acetaminophen, acetaminophen, albuterol, albuterol, HYDROmorphone (DILAUDID) injection, LORazepam, ondansetron (ZOFRAN) IV, ondansetron, oxyCODONE-acetaminophen, sodium chloride, sodium chloride, sodium chloride, sodium chloride, sodium chloride   Physical Exam: Filed Vitals:   05/08/13 0427  BP: 112/62  Pulse:   Temp:   Resp:    See d/c summary     Assessment/Plan: Ready for d/c Dictated # [id number]   Illene Regulus [place] IM (o) [phone]; (c) [phone] Call-grp - Patsi Sears IM  Tele: [phone]  05/08/2013, 8:05 AM

## 2013-05-08 NOTE — Discharge Summary (Signed)
Jack [name], Jack [name]                  ACCOUNT NO.:  [id number]  MEDICAL RECORD NO.:  [id number]  LOCATION:  1307                         FACILITY:  [facility]  PHYSICIAN:  Rosalyn Gess. Brexton Sofia, MD  DATE OF BIRTH:  [birth year]  DATE OF ADMISSION:  05/04/2013 DATE OF DISCHARGE:  05/08/2013                              DISCHARGE SUMMARY   ADMITTING DIAGNOSES: 1. Edema left lower extremity. 2. Status post total knee replacement. 3. Emphysema.  DISCHARGE DIAGNOSES: 1. Recurrent lymphoma with venous obstruction. 2. Status post total knee replacement. 3. Emphysema.  CONSULTANTS:  [name] for Oncology.  PROCEDURES/IMAGING: 1. CT angio of the chest prior to admission, which was negative for     pulmonary embolus, pneumonia, or other acute cardiopulmonary     process.  Atherosclerosis including coronary artery disease noted.     Mild central lobular emphysema is noted.  Sequelae of old     granulomatous disease involving the spleen, liver and lungs is     noted. 2. CT of the abdomen and pelvis on May 04, 2013, which revealed     interval development of prominent necrotic-appearing adenopathy     largest in the left external iliac region measuring up to 6.7 cm.     The left external iliac artery traverses through adenopathy.  The     left external iliac vein is compressed and may be occluded.  The     left common iliac vein is narrowed.  Adenopathy noted in the left     operator region, left common iliac region, and at the level of the     aortic bifurcation, left periaortic and intra-aortic caval region     as well as the anterior to the inferior vena cava.  Adenopathy     invades through left psoas muscle.  Posterior to the left psoas     muscle a 3.4 cm lesion consistent with lymphoma.  Enlargement of     upper left thigh with stranding of fat planes consistent with poor     venous return caused by adenopathy.  Atherosclerotic type changes     of the abdominal aorta is noted.   Prominent narrowing origin of the     common iliac arteries more notable on the left.  Ectatic common     iliac arteries.  Diverticular with mild muscular hypertrophy of     sigmoid colon without extraluminal bowel inflammatory process, free     fluid or free air.  Impression upon the bladder base by prostate     gland which is partially calcified.  Degenerative changes of the     lumbar spine.  Bilateral femoral head avascular necrosis, greater     on the right.  HISTORY OF PRESENT ILLNESS:  Jack [name] presented to the office on April 27, 2013 for abdominal discomfort which he describes as a sharp pain in the lower abdomen.  The pain is constant, but will wax and wane, going from a 0 to 8/10.  Patient gets relief with oxycodone.  Bowel movement improves his symptoms.  Eating makes it worse.  He had no epigastric pain.  The patient had been taking a probiotic Pepto-Bismol  and Metamucil.  Also, he had been continuing Nexium twice a day. Initial diagnosis was possible diverticulitis and the patient was started on Cipro and Flagyl.  Over the next several days, his pain initially improved, but over the days prior to admission it had become more severe.  At that visit, the patient was noted to have swelling of the left leg that was persistent.  He did report increased shortness of breath, that led to having D-dimer drawn which was elevated at 3.45.  Subsequent CT angio of the chest was negative for PE.  The patient then had lower extremity venous Doppler that was negative for DVT.  The patient continued to have swelling of the legs despite elevation and stockings. Over the 24-36 hours prior to admission, the swelling was much worse and also erythema develop from the toes to the groin and the whole leg was warm to touch.  The patient was admitted for evaluation to rule out extrinsic venous compression in the abdomen as possible cause of unilateral edema and he was also started on IV  antibiotics for potential cellulitis.  Please see the H and P for past medical history, family history, and social history.  HOSPITAL COURSE: 1. Oncology:  The patient did have a CT scan of the abdomen and     pelvis, which did reveal recurrent lymphadenopathy thought to be     related to lymphoma that was recurrent.  This did explain the lower     extremity edema because of significant venous occlusion.  The     patient was seen in consultation by [name] and he was scheduled     quickly for initiation of chemotherapy using 1 dose of Rituxan and     then 2 doses of Treanda.  The patient had a chemo reaction to the     Rituxan and this required rapid response with racemic epinephrine     inhalation treatment as well as medical therapy.  He did very well     after this reaction.  The patient did tolerate his Treanda without     difficulty.  The patient did have a PICC line placed for his chemo,     but this will be removed at the time of discharge. The patient has tolerated his 1st round of chemotherapy well and at this point is ready for discharge home.  He is scheduled to see [name] on November 25 for followup, placement of a Port-A-Cath and for continued monthly treatment.  2. TKR:  Patient is status post right TKR and this has gone well.  He     is followed closely with Dr. Teryl Lucy.  The patient was     concerned for continuing home PT given his recent hospitalization     and some loss of ground in regard to recovery from his TKR.  The     patient is instructed to contact Dr. Shelba Flake office for     continuation of home PT. 3. Emphysema:  Patient does have some central lobular emphysema.     Reviewed with him the use of his medications.  Explained the Advair     to be used as a maintenance medication to 1 inhalation a.m. and at     bedtime.  ProAir is to be use as a rescue medication p.r.n.     wheezing.  The patient does have medications at home. 4. 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  He has inquired about the use of alcohol and he has     been advised to limit his intake to one 4 ounce pour of wine per     Day.  With the patient's problems being addressed, with chemotherapy being Started, with the patient being medically stable he is now ready for discharge to home with followup with [name] as above.  DISCHARGE PHYSICAL EXAMINATION:  VITAL SIGNS:  Temperature was 97.7, blood pressure 112/62, heart rate 52, respirations 28, oxygen saturation 100% on room air. GENERAL APPEARANCE:  This is a well-nourished, well-developed gentleman looking younger than his stated chronologic age.  Sitting on the side of the bed enjoying breakfast.  The patient has no signs of distress. HEENT:  Unremarkable.  Conjunctiva and sclerae being clear. NECK:  Supple. CHEST:  Patient is moving air well with no rales, wheezes, or rhonchi. No increased work of breathing. CARDIOVASCULAR:  Patient had quiet precordium.  His heart sounds were regular with no murmurs to my exam. NEURO:  Patient is awake, alert.  He is oriented to person, place, time and context.  Speech is clear.  Cognition is normal.  FINAL LABORATORY:  From May 06, 2013, sodium 132, potassium 4.3, chloride 98, CO2 of 25, BUN of 8, creatinine 0.78, and glucose of 123. CBC with a white count of 6200, hemoglobin 10 g, platelet count 203,000, differential with 61% segs, 23% lymphs, 12% monos, 3% eosinophils.  DISCHARGE MEDICATIONS: 1. Albuterol metered dose inhaler 2 puffs q.6 hours p.r.n. 2. Uroxatral 10 mg q.24. 3. Allopurinol 100 mg daily. 4. Aspirin 81 mg daily. 5. Besivance 0.6% suspension to the right eye 2 days each month after     ocular injection. 6. Lomotil 1 tablet 4 times daily as needed for diarrhea. 7. Nexium 40 mg q.a.m. before breakfast. 8. Famciclovir 250 mg daily. 9. Advair 500/50, 1 inhalation a.m. and at bedtime. 10.Magnesium oxide 400  mg daily. 11.Metoprolol XL 50 mg 1 tablet daily. 12.Multivitamin (PreserVision AREDS) 1 p.o. daily. 13.Percocet 10/325, one to two tablets every 6 hours as needed for     pain. 14.Potassium 20 mEq daily. 15.Metamucil 1 packet by mouth daily p.r.n. constipation. 16.Qualaquin 324 mg capsule 1 b.i.d. to prevent cramps. 17.Senokot 2 tablets by mouth daily. 18.Sildenafil 100 mg p.r.n. 19.Zocor 20 mg p.o. daily. 20.Restoril 30 mg p.o. at bedtime p.r.n. 21.Kenalog 0.1% cream apply topically as needed b.i.d.  DISPOSITION:  The patient is medically stable and ready for discharge to home.  The patient will contact Dr. Shelba Flake office in regard to continuation of home health physical therapy.  The patient will see [name] as scheduled on November 25.  The patient may continue with intra-ocular injections from his ophthalmologist, Dr. Ashley Royalty.  The patient will see Dr. Debby Bud on a p.r.n. basis and/or communicate via MyChart.  The patient's condition at the time of discharge, is medically stable with a good prognosis.     Rosalyn Gess Maximos Zayas, MD     MEN/MEDQ  D:  05/08/2013  T:  05/08/2013  Job:  [id number]  cc:   Benjiman Core, M.D. Fax: [phone]  Eulas Post, MD Fax: [phone]  Beulah Gandy. Ashley Royalty, M.D. [address]  [address] [place] [address] Fax: [phone]

## 2013-05-08 NOTE — Progress Notes (Signed)
Jack [name] phase of his chemotherapy yesterday. He received bendamustine and rituximab. He is ready for discharge today. He feels okay. There's no nausea vomiting. He's had no fever. He's had no chills. There's no diarrhea.  I will put him on allopurinol to make sure that we minimize tumor lysis which would be unusual any way but I want to make sure that we watch for this.  He's had no bleeding. He's had no cough.  Vital signs are all stable. Pulse is 52. Blood pressure 112 / 62.  Lungs are clear. Oral exam shows no mucositis. Cardiac exam regular and rhythm. Abdomen is slightly distended. There is no fluid wave. There is no obvious hepatomegaly. I cannot feel a spleen tip. He does have some chronic swelling of the left leg. Skin is unremarkable. No focal neurological deficits.  There were no labs were today.  He'll be discharged. [name] will follow him up as an outpatient.  I probably also would get him on some low-dose Famvir as a prophylaxis against shingles.  I very much appreciate Dr. Alvera Novel help with Jack [name].  Pete e.

## 2013-05-08 NOTE — Progress Notes (Signed)
Patient was stable at time of discharge. IV team had removed PICC - vitals stable. Reviewed discharge education with patient and family. They verbalized understanding and had no further questions.

## 2013-05-08 NOTE — Progress Notes (Signed)
PT screened patient on 11/13 and pt declined PT needs at the time. Pt is being DC'd to home today. No PT indicated.Blanchard Kelch PT [phone]

## 2013-05-08 NOTE — Progress Notes (Signed)
2nd bag of Bendamustine started  at  2016 and ended at 2130. Pt tolerated it well,had no complaints.will continue to monitor

## 2013-05-09 NOTE — Progress Notes (Signed)
   CARE MANAGEMENT NOTE 05/09/2013  Patient:  Jack [name], Jack [name]   Account Number:  [id number]  Date Initiated:  05/05/2013  Documentation initiated by:  DAVIS,TYMEEKA  Subjective/Objective Assessment:   79 yo male admitted for lyphoma and right lower extremity cellulitis.     Action/Plan:   Home when medically stable   Anticipated DC Date:  05/08/2013   Anticipated DC Plan:  HOME W HOME HEALTH SERVICES      DC Planning Services  CM consult      Choice offered to / List presented to:  NA   DME arranged  NA      DME agency  NA     HH arranged  HH-2 PT  HH-1 RN      [facility] agency  [facility]   Status of service:  Completed, signed off Medicare Important Message given?   (If response is "NO", the following Medicare IM given date fields will be blank) Date Medicare IM given:   Date Additional Medicare IM given:    Discharge Disposition:  HOME W HOME HEALTH SERVICES  Per UR Regulation:  Reviewed for med. necessity/level of care/duration of stay  If discussed at Long Length of Stay Meetings, dates discussed:    Comments:  05/09/2013 1145 NCM notified [facility] of dc on 11/15. HH orderes entered and faxed to [facility] for [facility]. Daryl Eastern RN CCM Case Mgmt phone [phone]  05/07/13 1204 Tymeeka Davis,RN,MSN [phone] Sharmon Leyden, RN Registered Nurse Signed CASE MANAGEMENT Care Management Note Service date: 05/07/2013 1:20 PM Cm spoke with patient at the bedside with adult grand daughter present concerning discharge planning. Pt anticipates discharging home on 05/08/13. Pt currently active with [facility]. Pt has Rw and cane for home dme use. Pt will require MD orders for resumption of care at discharge.   05/05/13 1057 Tymeeka Davis,RN,MSN [phone] Chart reviewed for utilization of services. Pt active with [facility] for [facility] services. Pt will require resumtpion of care orders at time of discharge. No other needs identified at this time. PCP: Dr. Debby Bud

## 2013-05-10 LAB — CULTURE, BLOOD (ROUTINE X 2)
Culture: NO GROWTH
Culture: NO GROWTH

## 2013-05-11 ENCOUNTER — Telehealth: Payer: Self-pay | Admitting: *Deleted

## 2013-05-11 NOTE — Telephone Encounter (Signed)
Ok for PT as requested. PICC line site should not need a continued dressing. If it doesn't look right let me know.

## 2013-05-11 NOTE — Telephone Encounter (Signed)
Left detailed message on VM advising of MDs message 

## 2013-05-11 NOTE — Telephone Encounter (Signed)
Meriam Sprague called requesting home PT twice a week for 4 weeks and also care instructions for bandage at PICC line site.  Please advise

## 2013-05-12 ENCOUNTER — Other Ambulatory Visit: Payer: Self-pay | Admitting: Certified Registered Nurse Anesthetist

## 2013-05-13 NOTE — Progress Notes (Signed)
Patient ID: Jack [name], male   DOB: 1929/02/17, 79 y.o.   MRN: [id number]    March 30, 2013  History of present illness:  The patient is status post total right total knee replacement.  The patient was at a place for rehabilitation, mobility, strength training, and ADL management.  Chief complaint:  The patient reports some diarrhea, apparently now resolved. No other verbalize concerns.  Physical examination:  Pupils are equally round and reactive to light, question of some exophthalmus. Extraocular movements are intact. Conjunctiva are pale pink. No true icteric findings.  Tympanic membranes unremarkable.  Oropharynx, teeth in excellent repair, no gross oral lesions.  No palpable cervical adenopathy, no apparent carotid bruits.  Heart sounds single with assistance but certainly regular rate and rhythm.  Bilateral breath sounds are clear but somewhat diminished.  Abdomen positive bowel sounds otherwise soft and nontender. Examination of the right upper quadrant which showed that a firm liver edge is easily palpable with deep inspiration.  Pitting edema bilaterally 2-3+, more significant on the right, operative side. Positive for hemosiderin staining bilaterally. A dry dressing continues over the right knee. Wound care is managed by the nurse. He certainly has had no erythema of the surrounding area.  Posterior tibial pulses palpable bilaterally.  Assessment/plan:  Concerned for the pitting edema. The patient consistently sits with his legs hanging down. Have discussed issue of gravity. Have discussed with patient a trial of Lasix may be necessary to address the edema but staying with his legs in a dependent position would not prevent reaccumulation of any edema. Discuss with patient option of compression wrapping versus utilization of his TED hose as well.  Related to patient's history of alcohol abuse and ongoing alcohol use, we'll check his liver panel in the morning.  We'll continue to  follow this patient over time.  This encounter was created in error - please disregard. This encounter was created in error - please disregard.

## 2013-05-17 ENCOUNTER — Encounter (INDEPENDENT_AMBULATORY_CARE_PROVIDER_SITE_OTHER): Payer: Medicare Other | Admitting: Ophthalmology

## 2013-05-17 DIAGNOSIS — H3530 Unspecified macular degeneration: Secondary | ICD-10-CM

## 2013-05-17 DIAGNOSIS — I10 Essential (primary) hypertension: Secondary | ICD-10-CM

## 2013-05-17 DIAGNOSIS — H353290 Exudative age-related macular degeneration, unspecified eye, stage unspecified: Secondary | ICD-10-CM

## 2013-05-17 DIAGNOSIS — H43819 Vitreous degeneration, unspecified eye: Secondary | ICD-10-CM

## 2013-05-17 DIAGNOSIS — H35039 Hypertensive retinopathy, unspecified eye: Secondary | ICD-10-CM

## 2013-05-18 ENCOUNTER — Other Ambulatory Visit (HOSPITAL_BASED_OUTPATIENT_CLINIC_OR_DEPARTMENT_OTHER): Payer: Medicare Other | Admitting: Lab

## 2013-05-18 ENCOUNTER — Ambulatory Visit (HOSPITAL_BASED_OUTPATIENT_CLINIC_OR_DEPARTMENT_OTHER): Payer: Medicare Other | Admitting: Oncology

## 2013-05-18 ENCOUNTER — Telehealth: Payer: Self-pay | Admitting: Oncology

## 2013-05-18 VITALS — BP 137/52 | HR 87 | Temp 98.1°F | Resp 17 | Ht 71.0 in | Wt 186.2 lb

## 2013-05-18 DIAGNOSIS — C8589 Other specified types of non-Hodgkin lymphoma, extranodal and solid organ sites: Secondary | ICD-10-CM

## 2013-05-18 DIAGNOSIS — C8299 Follicular lymphoma, unspecified, extranodal and solid organ sites: Secondary | ICD-10-CM

## 2013-05-18 DIAGNOSIS — C8590 Non-Hodgkin lymphoma, unspecified, unspecified site: Secondary | ICD-10-CM

## 2013-05-18 LAB — COMPREHENSIVE METABOLIC PANEL (CC13)
AST: 26 U/L (ref 5–34)
Albumin: 3.2 g/dL — ABNORMAL LOW (ref 3.5–5.0)
BUN: 12 mg/dL (ref 7.0–26.0)
CO2: 26 mEq/L (ref 22–29)
Calcium: 9.6 mg/dL (ref 8.4–10.4)
Chloride: 99 mEq/L (ref 98–109)
Creatinine: 0.8 mg/dL (ref 0.7–1.3)
Glucose: 158 mg/dl — ABNORMAL HIGH (ref 70–140)
Potassium: 4.6 mEq/L (ref 3.5–5.1)

## 2013-05-18 LAB — CBC WITH DIFFERENTIAL/PLATELET
Basophils Absolute: 0.1 10*3/uL (ref 0.0–0.1)
Eosinophils Absolute: 0.1 10*3/uL (ref 0.0–0.5)
HCT: 32.9 % — ABNORMAL LOW (ref 38.4–49.9)
HGB: 10.2 g/dL — ABNORMAL LOW (ref 13.0–17.1)
MCH: 27.3 pg (ref 27.2–33.4)
MCV: 87.9 fL (ref 79.3–98.0)
MONO#: 1 10*3/uL — ABNORMAL HIGH (ref 0.1–0.9)
NEUT#: 4.7 10*3/uL (ref 1.5–6.5)
NEUT%: 68.1 % (ref 39.0–75.0)
RBC: 3.75 10*6/uL — ABNORMAL LOW (ref 4.20–5.82)
RDW: 15.5 % — ABNORMAL HIGH (ref 11.0–14.6)
WBC: 6.9 10*3/uL (ref 4.0–10.3)
lymph#: 1 10*3/uL (ref 0.9–3.3)

## 2013-05-18 MED ORDER — LIDOCAINE-PRILOCAINE 2.5-2.5 % EX CREA: 1.0000 "application " | TOPICAL_CREAM | CUTANEOUS | Status: DC | PRN

## 2013-05-18 NOTE — Telephone Encounter (Signed)
worked 11/25 POF appts made Order copy to SD to schedule Tx on 12/11 and 1/12 AVS and CAL given to pt shh

## 2013-05-18 NOTE — Progress Notes (Signed)
Hematology and Oncology Follow Up Visit  Jack [name] [id number] January 19, 1929 79 y.o. 05/18/2013 4:15 PM   Principle Diagnosis: This is a 79 year old gentleman with grade 3 stage II follicular lymphoma diagnosed in 2007. He was in remission until November of 2014 with a presented with a relapse pelvic disease and lower remedy edema  Prior Therapy: Patient treated with 6 cycles of CHOP with rituximab.  Therapy concluded August 2007.  He had a complete response and had been in remission since that time.  Current therapy: Salvage chemotherapy status post the first cycle of bendamustine and rituximab. He will be due for the second cycle on 06/03/2013.  Interim History: [name] presents today for a followup visit.  Since his last visit, he developed right lower extremity swelling up to the thigh associated with erythema and was hospitalized by Dr. Debby Bud between November 11 and 05/08/2013. He did have a CT scan of the abdomen on 11/11 which showed necrotic-appearing adenopathy in the left external iliac region measuring 6.7 cm cause and lymphatic obstruction. Patient treated with the first cycle of chemotherapy of rituximab and bendamustine that was overall well tolerated. He did develop infusion-related complications from rituximab that required slowing of the infusion without any long-lasting complications. Since his discharge, he has been doing relatively fair without any complications. His left lower extremity edema has significantly improved but still rather prominent. He is not reporting any nausea or vomiting. He is not reporting any bleeding complications. He is still getting physical therapy after his right knee replacement Medications: Current Outpatient Prescriptions  Medication Sig Dispense Refill  . albuterol (PROVENTIL HFA;VENTOLIN HFA) 108 (90 BASE) MCG/ACT inhaler Inhale 2 puffs into the lungs every 6 (six) hours as needed for shortness of breath.       . alfuzosin (UROXATRAL) 10 MG 24  hr tablet Take 10 mg by mouth every morning.       Marland Kitchen allopurinol (ZYLOPRIM) 100 MG tablet Take 1 tablet (100 mg total) by mouth daily.  30 tablet  2  . aspirin EC 81 MG tablet Take 81 mg by mouth daily.      Marland Kitchen Besifloxacin HCl (BESIVANCE) 0.6 % SUSP Place 1 drop into the right eye See admin instructions. Uses for 2 days after each monthly ocular injection.  Next injection due 03/12/2013.      . diphenoxylate-atropine (LOMOTIL) 2.5-0.025 MG per tablet Take 1 tablet by mouth 4 (four) times daily as needed for diarrhea or loose stools.      Marland Kitchen esomeprazole (NEXIUM) 40 MG capsule Take 40 mg by mouth daily before breakfast.      . famciclovir (FAMVIR) 500 MG tablet Take 0.5 tablets (250 mg total) by mouth daily.  30 tablet  2  . Fluticasone-Salmeterol (ADVAIR DISKUS) 500-50 MCG/DOSE AEPB Inhale 1 puff into the lungs 2 (two) times daily.  60 each  11  . lidocaine-prilocaine (EMLA) cream Apply 1 application topically as needed. Apply to port before chemotherapy.  30 g  0  . magnesium oxide (MAG-OX) 400 MG tablet Take 400 mg by mouth daily.      . metoprolol succinate (TOPROL-XL) 50 MG 24 hr tablet Take 75 mg by mouth every morning. Take with or immediately following a meal.      . Multiple Vitamins-Minerals (PRESERVISION AREDS PO) Take 1 tablet by mouth 2 (two) times daily.      Marland Kitchen oxyCODONE-acetaminophen (PERCOCET) 10-325 MG per tablet Take 1-2 tablets by mouth every 6 (six) hours as needed for pain. MAXIMUM  TOTAL ACETAMINOPHEN DOSE IS 4000 MG PER DAY  75 tablet  0  . potassium chloride SA (K-DUR,KLOR-CON) 20 MEQ tablet Take 20 mEq by mouth daily.      . psyllium (METAMUCIL) 58.6 % packet Take 1 packet by mouth daily.      . quiNINE (QUALAQUIN) 324 MG capsule Take 324 mg by mouth 2 (two) times daily.      . sennosides-docusate sodium (SENOKOT-S) 8.6-50 MG tablet Take 2 tablets by mouth daily.  30 tablet  1  . sildenafil (VIAGRA) 100 MG tablet Take 100 mg by mouth daily as needed for erectile dysfunction.       . simvastatin (ZOCOR) 20 MG tablet Take 20 mg by mouth every evening.      . temazepam (RESTORIL) 30 MG capsule Take 30 mg by mouth at bedtime.      . triamcinolone cream (KENALOG) 0.1 % Apply topically 2 (two) times daily.       No current facility-administered medications for this visit.    Allergies:  Allergies  Allergen Reactions  . Amoxicillin     REACTION: rash  . Penicillins Rash    Past Medical History, Surgical history, Social history, and Family History were reviewed and updated.  Review of Systems:  Remaining ROS negative. Physical Exam: Blood pressure 137/52, pulse 87, temperature 98.1 F (36.7 C), temperature source Oral, resp. rate 17, height 5\' 11"  (1.803 m), weight 186 lb 3.2 oz (84.46 kg), SpO2 100.00%. ECOG: 1  General appearance: alert Head: Normocephalic, without obvious abnormality, atraumatic Neck: no adenopathy, no carotid bruit, no JVD, supple, symmetrical, trachea midline and thyroid not enlarged, symmetric, no tenderness/mass/nodules Lymph nodes: Cervical, supraclavicular, and axillary nodes normal. Heart:regular rate and rhythm, S1, S2 normal, no murmur, click, rub or gallop Lung:chest clear, no wheezing, rales, normal symmetric air entry Abdomin: soft, non-tender, without masses or organomegaly EXT:no erythema, induration, or nodules   Lab Results: Lab Results  Component Value Date   WBC 6.9 05/18/2013   HGB 10.2* 05/18/2013   HCT 32.9* 05/18/2013   MCV 87.9 05/18/2013   PLT 220 05/18/2013     Chemistry      Component Value Date/Time   NA 136 05/18/2013 1513   NA 132* 05/06/2013 0545   K 4.6 05/18/2013 1513   K 4.3 05/06/2013 0545   CL 98 05/06/2013 0545   CL 104 04/22/2012 1310   CO2 26 05/18/2013 1513   CO2 25 05/06/2013 0545   BUN 12.0 05/18/2013 1513   BUN 8 05/06/2013 0545   CREATININE 0.8 05/18/2013 1513   CREATININE 0.78 05/06/2013 0545      Component Value Date/Time   CALCIUM 9.6 05/18/2013 1513   CALCIUM 9.5  05/06/2013 0545   ALKPHOS 65 05/18/2013 1513   ALKPHOS 64 05/04/2013 1200   AST 26 05/18/2013 1513   AST 30 05/04/2013 1200   ALT 19 05/18/2013 1513   ALT 14 05/04/2013 1200   BILITOT 0.46 05/18/2013 1513   BILITOT 0.3 05/04/2013 1200      Impression and Plan:  A pleasant 79 year old gentleman with the following issues:   1. Grade 3 stage II follicular lymphoma, status post CHOP with rituximab.  Now he has developed relapsed disease and tolerated the first cycle of salvage chemotherapy with bendamustine and rituximab without any complications. The plan is to proceed with the second cycle around 06/03/2013. Risks and benefits of chemotherapy were discussed again and he is willing to proceed. 2. IV access: I will set him up  with a Port-A-Cath insertion in the near future. I will give him prescription for EMLA cream as well 3. Next followup will be in 05/2013 before the next cycle of chemotherapy.   Eli Hose, MD 11/25/20144:15 PM

## 2013-05-21 ENCOUNTER — Other Ambulatory Visit: Payer: Self-pay | Admitting: Certified Registered Nurse Anesthetist

## 2013-05-24 ENCOUNTER — Encounter (HOSPITAL_COMMUNITY): Payer: Self-pay | Admitting: Pharmacy Technician

## 2013-05-27 ENCOUNTER — Encounter: Payer: Self-pay | Admitting: Internal Medicine

## 2013-05-27 MED ORDER — TRAZODONE HCL 50 MG PO TABS: 50.0000 mg | ORAL_TABLET | Freq: Every evening | ORAL | Status: AC | PRN

## 2013-05-28 ENCOUNTER — Other Ambulatory Visit (HOSPITAL_COMMUNITY): Payer: Self-pay | Admitting: Radiology

## 2013-05-28 ENCOUNTER — Other Ambulatory Visit: Payer: Self-pay | Admitting: Radiology

## 2013-05-31 ENCOUNTER — Telehealth: Payer: Self-pay | Admitting: *Deleted

## 2013-05-31 DIAGNOSIS — C8589 Other specified types of non-Hodgkin lymphoma, extranodal and solid organ sites: Secondary | ICD-10-CM

## 2013-05-31 MED ORDER — OXYCODONE-ACETAMINOPHEN 10-325 MG PO TABS: 1.0000 | ORAL_TABLET | Freq: Four times a day (QID) | ORAL | Status: DC | PRN

## 2013-05-31 NOTE — Telephone Encounter (Addendum)
PT. RECEIVED THIS MEDICATION WHILE HE WAS IN A NURSING HOME FOR REHAB AFTER A KNEE REPLACEMENT. HE STATES DR.SHADAD TOLD HIM TO USE THIS MEDICATION FOR HIS STOMACH PAIN. PT. NOW NEEDS A REFILL. LAST FILLED 04/08/13 FOR #60. DR.SHADAD IS OUT OF THE OFFICE. SPOKE TO DR.SHADAD'S NURSE, DIXIE SMITH. WILL REFILL PT.'S PAIN MEDICATION.

## 2013-05-31 NOTE — Addendum Note (Signed)
Addended by: Arvilla Meres on: 05/31/2013 11:36 AM   Modules accepted: Orders

## 2013-05-31 NOTE — Telephone Encounter (Signed)
NOTIFIED PT.'S GRANDDAUGHTER, EMILY, THAT PT.'S PRESCRIPTION IS READY. SHE VOICES UNDERSTANDING.

## 2013-06-01 ENCOUNTER — Telehealth: Payer: Self-pay | Admitting: *Deleted

## 2013-06-01 ENCOUNTER — Ambulatory Visit (HOSPITAL_COMMUNITY)
Admission: RE | Admit: 2013-06-01 | Discharge: 2013-06-01 | Disposition: A | Discharge status: Home / Self Care | Payer: Medicare Other | Source: Ambulatory Visit | Attending: Oncology | Admitting: Oncology

## 2013-06-01 ENCOUNTER — Other Ambulatory Visit: Payer: Self-pay | Admitting: Oncology

## 2013-06-01 ENCOUNTER — Encounter (HOSPITAL_COMMUNITY): Payer: Self-pay

## 2013-06-01 ENCOUNTER — Ambulatory Visit (HOSPITAL_COMMUNITY)
Admission: RE | Admit: 2013-06-01 | Discharge: 2013-06-01 | Disposition: A | Discharge status: Home / Self Care | Payer: Medicare Other | Source: Ambulatory Visit | Attending: Radiology | Admitting: Radiology

## 2013-06-01 ENCOUNTER — Ambulatory Visit (HOSPITAL_COMMUNITY): Admission: RE | Admit: 2013-06-01 | Payer: Medicare Other | Source: Ambulatory Visit

## 2013-06-01 DIAGNOSIS — R0602 Shortness of breath: Secondary | ICD-10-CM | POA: Insufficient documentation

## 2013-06-01 DIAGNOSIS — C8590 Non-Hodgkin lymphoma, unspecified, unspecified site: Secondary | ICD-10-CM

## 2013-06-01 DIAGNOSIS — C8299 Follicular lymphoma, unspecified, extranodal and solid organ sites: Secondary | ICD-10-CM | POA: Insufficient documentation

## 2013-06-01 DIAGNOSIS — R609 Edema, unspecified: Secondary | ICD-10-CM | POA: Insufficient documentation

## 2013-06-01 LAB — CBC WITH DIFFERENTIAL/PLATELET
Basophils Absolute: 0 10*3/uL (ref 0.0–0.1)
Basophils Relative: 1 % (ref 0–1)
Eosinophils Relative: 3 % (ref 0–5)
HCT: 31.6 % — ABNORMAL LOW (ref 39.0–52.0)
Lymphocytes Relative: 28 % (ref 12–46)
MCHC: 33.2 g/dL (ref 30.0–36.0)
MCV: 85.9 fL (ref 78.0–100.0)
Monocytes Absolute: 1 10*3/uL (ref 0.1–1.0)
Monocytes Relative: 14 % — ABNORMAL HIGH (ref 3–12)
Neutrophils Relative %: 54 % (ref 43–77)
Platelets: 149 10*3/uL — ABNORMAL LOW (ref 150–400)
RDW: 14.9 % (ref 11.5–15.5)
WBC: 7.1 10*3/uL (ref 4.0–10.5)

## 2013-06-01 LAB — PROTIME-INR: Prothrombin Time: 13.1 seconds (ref 11.6–15.2)

## 2013-06-01 LAB — APTT: aPTT: 29 seconds (ref 24–37)

## 2013-06-01 MED ORDER — MIDAZOLAM HCL 2 MG/2ML IJ SOLN
INTRAMUSCULAR | Status: AC
  Filled 2013-06-01: qty 4

## 2013-06-01 MED ORDER — HEPARIN SOD (PORK) LOCK FLUSH 100 UNIT/ML IV SOLN
INTRAVENOUS | Status: AC
  Filled 2013-06-01: qty 5

## 2013-06-01 MED ORDER — HEPARIN SOD (PORK) LOCK FLUSH 100 UNIT/ML IV SOLN
500.0000 [IU] | Freq: Once | INTRAVENOUS | Status: AC
  Administered 2013-06-01: 500 [IU] via INTRAVENOUS

## 2013-06-01 MED ORDER — LIDOCAINE HCL 1 % IJ SOLN
INTRAMUSCULAR | Status: AC
  Filled 2013-06-01: qty 20

## 2013-06-01 MED ORDER — MIDAZOLAM HCL 2 MG/2ML IJ SOLN
INTRAMUSCULAR | Status: AC | PRN
  Administered 2013-06-01 (×2): 1 mg via INTRAVENOUS

## 2013-06-01 MED ORDER — FENTANYL CITRATE 0.05 MG/ML IJ SOLN
INTRAMUSCULAR | Status: AC | PRN
  Administered 2013-06-01 (×2): 50 ug via INTRAVENOUS

## 2013-06-01 MED ORDER — SODIUM CHLORIDE 0.9 % IV SOLN
INTRAVENOUS | Status: DC
  Administered 2013-06-01: 10:00:00 via INTRAVENOUS

## 2013-06-01 MED ORDER — FENTANYL CITRATE 0.05 MG/ML IJ SOLN
INTRAMUSCULAR | Status: AC
  Filled 2013-06-01: qty 4

## 2013-06-01 MED ORDER — LIDOCAINE-EPINEPHRINE (PF) 2 %-1:200000 IJ SOLN
INTRAMUSCULAR | Status: AC
  Filled 2013-06-01: qty 20

## 2013-06-01 MED ORDER — VANCOMYCIN HCL IN DEXTROSE 1-5 GM/200ML-% IV SOLN
1000.0000 mg | Freq: Once | INTRAVENOUS | Status: AC
  Administered 2013-06-01: 1000 mg via INTRAVENOUS
  Filled 2013-06-01: qty 200

## 2013-06-01 NOTE — Telephone Encounter (Signed)
Jack [name] called requesting verbal ok to recertify for 60 days for Medicare.  Please advise

## 2013-06-01 NOTE — Procedures (Signed)
Placement of right jugular portacath.  Tip in lower SVC and ready to use.  No immediate complication.   

## 2013-06-01 NOTE — Telephone Encounter (Signed)
ok 

## 2013-06-01 NOTE — H&P (Signed)
Chief Complaint: "I am here for a port placement." Referring Physician: [name] HPI: Jack [name] is an 79 y.o. male with PMHx of grade 3 stage II follicular lymphoma diagnosed in 2007, he had previously been in remission until November 2014. The patient was c/o LLE edema in November after right knee surgery 03/22/13 and had a venous duplex on 11/5 which was negative for any DVT. The patient also had an elevated D-dimer and had a CTA chest on 04/27/13 which was negative for a pulmonary embolus. He is here today for a port-a-catheter placment after relapse of pelvic disease. He has had one treatment and states his LLE edema has improved some but still twice the size of his RLE. He states his shortness of breath has worsening. He denies any chest pain at rest or with exertion. He denies any active bleeding, blood in his stool or urine. He denies any fever or chills.   Past Medical History:  Past Medical History  Diagnosis Date  . GERD (gastroesophageal reflux disease)   . Osteoarthritis   . Alcohol abuse     in recovery 2006  . 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 wine only  . Irritable bowel   . Hyperlipidemia   . Insomnia   . PAC (premature atrial contraction)     stable (ruled out for a fib)  . Prostatitis     chronic bacterial  . Pseudogout   . Sinus problem     chronic  . Cellulitis     2nd to T. Pedis(not at present)  . Hearing loss 03-10-12    bilateral hearing aids  . Complication of anesthesia 03-10-12    after shoulder  surgery -difficulty awakening,breathing problems  . Emphysema 03-10-12    tx. inhalers, uses steam room frequently  . Cramping of feet 03-10-12    cramping of both legs and hands occ.  . Non Hodgkin's lymphoma 03-10-12     '07-1 yr. in remission([facility])-not seeing now  . Osteoarthritis of left knee 03/17/2012  . Glaucoma   . Macular degeneration   . Hypertension   . Shortness of breath     Past Surgical History:   Past Surgical History  Procedure Laterality Date  . Esophagogastroduodenoscopy  01-27-02  . Inguinal hernia repair  1979  . Torn biceps repair  03-10-12    Left rotator cuff repair  . Hernia repair    . Cataract surgery  03-10-12    03-04-12(right)/ (03-11-12-left)  . Total knee arthroplasty  03/17/2012    Procedure: TOTAL KNEE ARTHROPLASTY;  Surgeon: Eulas Post, MD;  Location: [facility];  Service: Orthopedics;  Laterality: Left;  Marland Kitchen Eye surgery  2014    cataract extraction with IOL both eyes staged  . Colonoscopy    . Rotator cuff repair Left   . Total knee arthroplasty Right 03/22/2013    Procedure: TOTAL KNEE ARTHROPLASTY;  Surgeon: Eulas Post, MD;  Location: [facility];  Service: Orthopedics;  Laterality: Right;  righ total knee arthroplasty    Family History:  Family History  Problem Relation Age of Onset  . Coronary artery disease Father   . Heart attack Father   . Cancer Brother     colon  . Other Other     TB    Social History:  reports that he quit smoking about 34 years ago. His smoking use included Cigarettes. He smoked 0.00 packs per day. He has never used smokeless tobacco.  He reports that he drinks about 6.7 ounces of alcohol per week. He reports that he does not use illicit drugs.  Allergies:  Allergies  Allergen Reactions  . Amoxicillin     REACTION: rash  . Latex Rash  . Penicillins Rash      Medication List    ASK your doctor about these medications       albuterol 108 (90 BASE) MCG/ACT inhaler  Commonly known as:  PROVENTIL HFA;VENTOLIN HFA  Inhale 2 puffs into the lungs every 6 (six) hours as needed for shortness of breath.     alfuzosin 10 MG 24 hr tablet  Commonly known as:  UROXATRAL  Take 10 mg by mouth every morning.     allopurinol 100 MG tablet  Commonly known as:  ZYLOPRIM  Take 100 mg by mouth daily.     aspirin EC 81 MG tablet  Take 81 mg by mouth every evening.     BESIVANCE 0.6 % Susp  Generic drug:  Besifloxacin HCl  Place 1  drop into the right eye See admin instructions. Uses for 2 days after each monthly ocular injection.  Next injection due 03/12/2013.     esomeprazole 40 MG capsule  Commonly known as:  NEXIUM  Take 40 mg by mouth daily before breakfast.     famciclovir 500 MG tablet  Commonly known as:  FAMVIR  Take 250 mg by mouth daily.     Fluticasone-Salmeterol 500-50 MCG/DOSE Aepb  Commonly known as:  ADVAIR  Inhale 1 puff into the lungs 2 (two) times daily.     lidocaine-prilocaine cream  Commonly known as:  EMLA  Apply 1 application topically as needed. Apply to port before chemotherapy.     magnesium oxide 400 MG tablet  Commonly known as:  MAG-OX  Take 400 mg by mouth daily.     Menthol (Topical Analgesic) 10 % Liqd  Apply 1 application topically 3 (three) times daily as needed (pain).     metoprolol succinate 50 MG 24 hr tablet  Commonly known as:  TOPROL-XL  Take 75 mg by mouth every morning. Take with or immediately following a meal.     oxyCODONE-acetaminophen 10-325 MG per tablet  Commonly known as:  PERCOCET  Take 1-2 tablets by mouth every 6 (six) hours as needed for pain. MAXIMUM TOTAL ACETAMINOPHEN DOSE IS 4000 MG PER DAY     potassium chloride SA 20 MEQ tablet  Commonly known as:  K-DUR,KLOR-CON  Take 20 mEq by mouth daily.     PRESERVISION AREDS PO  Take 1 tablet by mouth 2 (two) times daily.     psyllium 58.6 % packet  Commonly known as:  METAMUCIL  Take 1 packet by mouth daily.     quiNINE 324 MG capsule  Commonly known as:  QUALAQUIN  Take 324 mg by mouth 2 (two) times daily.     sennosides-docusate sodium 8.6-50 MG tablet  Commonly known as:  SENOKOT-S  Take 2 tablets by mouth daily as needed for constipation.     sildenafil 100 MG tablet  Commonly known as:  VIAGRA  Take 100 mg by mouth daily as needed for erectile dysfunction.     simvastatin 20 MG tablet  Commonly known as:  ZOCOR  Take 20 mg by mouth every evening.     traZODone 50 MG tablet   Commonly known as:  DESYREL  Take 1 tablet (50 mg total) by mouth at bedtime as needed for sleep.  triamcinolone cream 0.1 %  Commonly known as:  KENALOG  Apply 1 application topically 2 (two) times daily as needed (rash).       Please HPI for pertinent positives, otherwise complete 10 system ROS negative.  Physical Exam: BP 111/59  Pulse 103  Temp(Src) 97.1 F (36.2 C) (Oral)  Resp 18  Ht 5\' 11"  (1.803 m)  Wt 190 lb (86.183 kg)  BMI 26.51 kg/m2  SpO2 99% Body mass index is 26.51 kg/(m^2).  General Appearance:  Alert, cooperative, no distress  Head:  Normocephalic, without obvious abnormality, atraumatic  Neck: Supple, symmetrical, trachea midline  Lungs:   Diminished with expiratory wheezes bilaterally,  respirations unlabored without use of accessory muscles.  Chest Wall:  No tenderness or deformity  Heart:  Regular rate and rhythm, S1, S2 normal, no murmur, rub or gallop.  Abdomen:   Soft, non-tender, non distended, (+) BS  Extremities: Extremities LLE 2+ pitting edema, RLE trace edema, atraumatic, no cyanosis  Pulses: 1+ and symmetric  Neurologic: Normal affect, no gross deficits.   Results for orders placed during the hospital encounter of 06/01/13 (from the past 48 hour(s))  APTT     Status: None   Collection Time    06/01/13  9:55 AM      Result Value Range   aPTT 29  24 - 37 seconds  CBC WITH DIFFERENTIAL     Status: Abnormal   Collection Time    06/01/13  9:55 AM      Result Value Range   WBC 7.1  4.0 - 10.5 K/uL   RBC 3.68 (*) 4.22 - 5.81 MIL/uL   Hemoglobin 10.5 (*) 13.0 - 17.0 g/dL   HCT 16.1 (*) 09.6 - 04.5 %   MCV 85.9  78.0 - 100.0 fL   MCH 28.5  26.0 - 34.0 pg   MCHC 33.2  30.0 - 36.0 g/dL   RDW 40.9  81.1 - 91.4 %   Platelets 149 (*) 150 - 400 K/uL   Neutrophils Relative % 54  43 - 77 %   Neutro Abs 3.9  1.7 - 7.7 K/uL   Lymphocytes Relative 28  12 - 46 %   Lymphs Abs 2.0  0.7 - 4.0 K/uL   Monocytes Relative 14 (*) 3 - 12 %   Monocytes  Absolute 1.0  0.1 - 1.0 K/uL   Eosinophils Relative 3  0 - 5 %   Eosinophils Absolute 0.2  0.0 - 0.7 K/uL   Basophils Relative 1  0 - 1 %   Basophils Absolute 0.0  0.0 - 0.1 K/uL  PROTIME-INR     Status: None   Collection Time    06/01/13  9:55 AM      Result Value Range   Prothrombin Time 13.1  11.6 - 15.2 seconds   INR 1.01  0.00 - 1.49   No results found.  Assessment/Plan History of grade 3 stage II follicular lymphoma diagnosed in 2007 in remission until 04/2013. Request for image guided port a catheter placement. Worsening shortness of breath, d/w Dr. Lowella Dandy will get a CXR prior to proceeding today. Patient has been NPO, labs reviewed, vancomycin ordered. Risks and Benefits discussed with the patient and his family. All of the patient's questions were answered, patient is agreeable to proceed. Consent signed and in chart.   Pattricia Boss D PA-C 06/01/2013, 10:33 AM

## 2013-06-02 NOTE — Telephone Encounter (Signed)
Spoke with Meriam Sprague, advised of MDs message

## 2013-06-03 ENCOUNTER — Other Ambulatory Visit (HOSPITAL_BASED_OUTPATIENT_CLINIC_OR_DEPARTMENT_OTHER): Payer: Medicare Other

## 2013-06-03 ENCOUNTER — Ambulatory Visit (HOSPITAL_BASED_OUTPATIENT_CLINIC_OR_DEPARTMENT_OTHER): Payer: Medicare Other | Admitting: Oncology

## 2013-06-03 ENCOUNTER — Telehealth: Payer: Self-pay | Admitting: *Deleted

## 2013-06-03 ENCOUNTER — Telehealth: Payer: Self-pay | Admitting: Oncology

## 2013-06-03 ENCOUNTER — Ambulatory Visit (HOSPITAL_BASED_OUTPATIENT_CLINIC_OR_DEPARTMENT_OTHER): Payer: Medicare Other

## 2013-06-03 VITALS — BP 122/70 | HR 93 | Temp 98.4°F | Resp 20

## 2013-06-03 VITALS — BP 126/55 | HR 106 | Temp 98.2°F | Resp 18 | Ht 71.0 in | Wt 196.1 lb

## 2013-06-03 DIAGNOSIS — C8299 Follicular lymphoma, unspecified, extranodal and solid organ sites: Secondary | ICD-10-CM

## 2013-06-03 DIAGNOSIS — Z5112 Encounter for antineoplastic immunotherapy: Secondary | ICD-10-CM

## 2013-06-03 DIAGNOSIS — C8590 Non-Hodgkin lymphoma, unspecified, unspecified site: Secondary | ICD-10-CM

## 2013-06-03 DIAGNOSIS — L299 Pruritus, unspecified: Secondary | ICD-10-CM

## 2013-06-03 DIAGNOSIS — Z5111 Encounter for antineoplastic chemotherapy: Secondary | ICD-10-CM

## 2013-06-03 DIAGNOSIS — M7989 Other specified soft tissue disorders: Secondary | ICD-10-CM

## 2013-06-03 DIAGNOSIS — C8589 Other specified types of non-Hodgkin lymphoma, extranodal and solid organ sites: Secondary | ICD-10-CM

## 2013-06-03 LAB — CBC WITH DIFFERENTIAL/PLATELET
Basophils Absolute: 0.1 10*3/uL (ref 0.0–0.1)
EOS%: 1.1 % (ref 0.0–7.0)
Eosinophils Absolute: 0.1 10*3/uL (ref 0.0–0.5)
HCT: 29.5 % — ABNORMAL LOW (ref 38.4–49.9)
HGB: 9.8 g/dL — ABNORMAL LOW (ref 13.0–17.1)
MCH: 28.7 pg (ref 27.2–33.4)
MCV: 86.1 fL (ref 79.3–98.0)
MONO#: 1.2 10*3/uL — ABNORMAL HIGH (ref 0.1–0.9)
MONO%: 12.7 % (ref 0.0–14.0)
NEUT%: 66.8 % (ref 39.0–75.0)
Platelets: 133 10*3/uL — ABNORMAL LOW (ref 140–400)
RDW: 15.6 % — ABNORMAL HIGH (ref 11.0–14.6)

## 2013-06-03 LAB — COMPREHENSIVE METABOLIC PANEL (CC13)
AST: 31 U/L (ref 5–34)
Alkaline Phosphatase: 59 U/L (ref 40–150)
Anion Gap: 12 mEq/L — ABNORMAL HIGH (ref 3–11)
BUN: 16.6 mg/dL (ref 7.0–26.0)
Calcium: 9.4 mg/dL (ref 8.4–10.4)
Chloride: 96 mEq/L — ABNORMAL LOW (ref 98–109)
Creatinine: 0.7 mg/dL (ref 0.7–1.3)
Potassium: 4.9 mEq/L (ref 3.5–5.1)

## 2013-06-03 MED ORDER — DIPHENHYDRAMINE HCL 50 MG/ML IJ SOLN
INTRAMUSCULAR | Status: AC
  Filled 2013-06-03: qty 1

## 2013-06-03 MED ORDER — SODIUM CHLORIDE 0.9 % IV SOLN
Freq: Once | INTRAVENOUS | Status: AC
  Administered 2013-06-03: 10:00:00 via INTRAVENOUS

## 2013-06-03 MED ORDER — DIPHENHYDRAMINE HCL 50 MG/ML IJ SOLN
25.0000 mg | Freq: Once | INTRAMUSCULAR | Status: AC | PRN
  Administered 2013-06-03: 25 mg via INTRAVENOUS

## 2013-06-03 MED ORDER — OXYCODONE-ACETAMINOPHEN 5-325 MG PO TABS
2.0000 | ORAL_TABLET | Freq: Once | ORAL | Status: AC
  Administered 2013-06-03: 2 via ORAL

## 2013-06-03 MED ORDER — DEXAMETHASONE SODIUM PHOSPHATE 10 MG/ML IJ SOLN
INTRAMUSCULAR | Status: AC
  Filled 2013-06-03: qty 1

## 2013-06-03 MED ORDER — ACETAMINOPHEN 325 MG PO TABS
ORAL_TABLET | ORAL | Status: AC
  Filled 2013-06-03: qty 2

## 2013-06-03 MED ORDER — DIPHENHYDRAMINE HCL 25 MG PO CAPS
50.0000 mg | ORAL_CAPSULE | Freq: Once | ORAL | Status: AC
  Administered 2013-06-03: 50 mg via ORAL

## 2013-06-03 MED ORDER — DIPHENHYDRAMINE HCL 25 MG PO CAPS
ORAL_CAPSULE | ORAL | Status: AC
  Filled 2013-06-03: qty 2

## 2013-06-03 MED ORDER — ACETAMINOPHEN 325 MG PO TABS
650.0000 mg | ORAL_TABLET | Freq: Once | ORAL | Status: AC
  Administered 2013-06-03: 650 mg via ORAL

## 2013-06-03 MED ORDER — ONDANSETRON 8 MG/50ML IVPB (CHCC)
8.0000 mg | Freq: Once | INTRAVENOUS | Status: AC
  Administered 2013-06-03: 8 mg via INTRAVENOUS

## 2013-06-03 MED ORDER — ONDANSETRON 8 MG/NS 50 ML IVPB
INTRAVENOUS | Status: AC
  Filled 2013-06-03: qty 8

## 2013-06-03 MED ORDER — SODIUM CHLORIDE 0.9 % IV SOLN
375.0000 mg/m2 | Freq: Once | INTRAVENOUS | Status: AC
  Administered 2013-06-03: 800 mg via INTRAVENOUS
  Filled 2013-06-03: qty 80

## 2013-06-03 MED ORDER — OXYCODONE-ACETAMINOPHEN 5-325 MG PO TABS
ORAL_TABLET | ORAL | Status: AC
  Filled 2013-06-03: qty 2

## 2013-06-03 MED ORDER — SODIUM CHLORIDE 0.9 % IV SOLN
90.0000 mg/m2 | Freq: Once | INTRAVENOUS | Status: AC
  Administered 2013-06-03: 190 mg via INTRAVENOUS
  Filled 2013-06-03: qty 38

## 2013-06-03 MED ORDER — DEXAMETHASONE SODIUM PHOSPHATE 10 MG/ML IJ SOLN
10.0000 mg | Freq: Once | INTRAMUSCULAR | Status: AC
  Administered 2013-06-03: 10 mg via INTRAVENOUS

## 2013-06-03 NOTE — Telephone Encounter (Signed)
appts made per 12/11 POF Email MW to add tx CT scheduled AVS and CAL given  Barium and directions given shh

## 2013-06-03 NOTE — Progress Notes (Signed)
Hematology and Oncology Follow Up Visit  Jack [name] [id number] [birth year] 79 y.o. 06/03/2013 9:12 AM   Principle Diagnosis: This is a 79 year old gentleman with grade 3 stage II follicular lymphoma diagnosed in 2007. He was in remission until November of 2014 with a presented with a relapse pelvic disease and lower remedy edema  Prior Therapy: Patient treated with 6 cycles of CHOP with rituximab.  Therapy concluded August 2007.  He had a complete response and had been in remission since that time.  Current therapy: Salvage chemotherapy status post the first cycle of bendamustine and rituximab. He will be due for the second cycle on 06/03/2013.  Interim History: Mr. Loman presents today for a followup visit.  Since his last visit, he has been doing about the same with continuous left lower extremity swelling. He still ambulatory with the help of a walker and a cane. He has not reported any falls or any other complications. Patient treated with the first cycle of chemotherapy of rituximab and bendamustine that was overall well tolerated. He did develop infusion-related complications from rituximab that required slowing of the infusion without any long-lasting complications. He still have episodic abdominal discomfort and occasional constipation. But otherwise has not had any recent hospitalization or illnesses.  Medications: Current Outpatient Prescriptions  Medication Sig Dispense Refill  . albuterol (PROVENTIL HFA;VENTOLIN HFA) 108 (90 BASE) MCG/ACT inhaler Inhale 2 puffs into the lungs every 6 (six) hours as needed for shortness of breath.       . alfuzosin (UROXATRAL) 10 MG 24 hr tablet Take 10 mg by mouth every morning.       Marland Kitchen allopurinol (ZYLOPRIM) 100 MG tablet Take 100 mg by mouth daily.      Marland Kitchen aspirin EC 81 MG tablet Take 81 mg by mouth every evening.       Marland Kitchen Besifloxacin HCl (BESIVANCE) 0.6 % SUSP Place 1 drop into the right eye See admin instructions. Uses for 2 days after each  monthly ocular injection.  Next injection due 03/12/2013.      Marland Kitchen esomeprazole (NEXIUM) 40 MG capsule Take 40 mg by mouth daily before breakfast.      . famciclovir (FAMVIR) 500 MG tablet Take 250 mg by mouth daily.      . Fluticasone-Salmeterol (ADVAIR) 500-50 MCG/DOSE AEPB Inhale 1 puff into the lungs 2 (two) times daily.      Marland Kitchen lidocaine-prilocaine (EMLA) cream Apply 1 application topically as needed. Apply to port before chemotherapy.      . magnesium oxide (MAG-OX) 400 MG tablet Take 400 mg by mouth daily.      . Menthol, Topical Analgesic, 10 % LIQD Apply 1 application topically 3 (three) times daily as needed (pain).      . metoprolol succinate (TOPROL-XL) 50 MG 24 hr tablet Take 75 mg by mouth every morning. Take with or immediately following a meal.      . Multiple Vitamins-Minerals (PRESERVISION AREDS PO) Take 1 tablet by mouth 2 (two) times daily.      Marland Kitchen oxyCODONE-acetaminophen (PERCOCET) 10-325 MG per tablet Take 1-2 tablets by mouth every 6 (six) hours as needed for pain. MAXIMUM TOTAL ACETAMINOPHEN DOSE IS 4000 MG PER DAY  30 tablet  0  . potassium chloride SA (K-DUR,KLOR-CON) 20 MEQ tablet Take 20 mEq by mouth daily.      . psyllium (METAMUCIL) 58.6 % packet Take 1 packet by mouth daily.      . quiNINE (QUALAQUIN) 324 MG capsule Take 324 mg by mouth  2 (two) times daily.      . sennosides-docusate sodium (SENOKOT-S) 8.6-50 MG tablet Take 2 tablets by mouth daily as needed for constipation.      . sildenafil (VIAGRA) 100 MG tablet Take 100 mg by mouth daily as needed for erectile dysfunction.      . simvastatin (ZOCOR) 20 MG tablet Take 20 mg by mouth every evening.      . traZODone (DESYREL) 50 MG tablet Take 1 tablet (50 mg total) by mouth at bedtime as needed for sleep.  30 tablet  3  . triamcinolone cream (KENALOG) 0.1 % Apply 1 application topically 2 (two) times daily as needed (rash).        No current facility-administered medications for this visit.    Allergies:  Allergies   Allergen Reactions  . Amoxicillin     REACTION: rash  . Latex Rash  . Penicillins Rash    Past Medical History, Surgical history, Social history, and Family History were reviewed and updated.  Review of Systems:  Remaining ROS negative. Physical Exam: Blood pressure 126/55, pulse 106, temperature 98.2 F (36.8 C), temperature source Oral, resp. rate 18, height 5\' 11"  (1.803 m), weight 196 lb 1.6 oz (88.95 kg). ECOG: 1  General appearance: alert Head: Normocephalic, without obvious abnormality, atraumatic Neck: no adenopathy, no carotid bruit, no JVD, supple, symmetrical, trachea midline and thyroid not enlarged, symmetric, no tenderness/mass/nodules Lymph nodes: Cervical, supraclavicular, and axillary nodes normal. Heart:regular rate and rhythm, S1, S2 normal, no murmur, click, rub or gallop Lung:chest clear, no wheezing, rales, normal symmetric air entry Abdomin: soft, non-tender, without masses or organomegaly EXT:no erythema, induration, or nodules   Lab Results: Lab Results  Component Value Date   WBC 9.4 06/03/2013   HGB 9.8* 06/03/2013   HCT 29.5* 06/03/2013   MCV 86.1 06/03/2013   PLT 133* 06/03/2013     Chemistry      Component Value Date/Time   NA 136 05/18/2013 1513   NA 132* 05/06/2013 0545   K 4.6 05/18/2013 1513   K 4.3 05/06/2013 0545   CL 98 05/06/2013 0545   CL 104 04/22/2012 1310   CO2 26 05/18/2013 1513   CO2 25 05/06/2013 0545   BUN 12.0 05/18/2013 1513   BUN 8 05/06/2013 0545   CREATININE 0.8 05/18/2013 1513   CREATININE 0.78 05/06/2013 0545      Component Value Date/Time   CALCIUM 9.6 05/18/2013 1513   CALCIUM 9.5 05/06/2013 0545   ALKPHOS 65 05/18/2013 1513   ALKPHOS 64 05/04/2013 1200   AST 26 05/18/2013 1513   AST 30 05/04/2013 1200   ALT 19 05/18/2013 1513   ALT 14 05/04/2013 1200   BILITOT 0.46 05/18/2013 1513   BILITOT 0.3 05/04/2013 1200      Impression and Plan:  A pleasant 79 year old gentleman with the following  issues:   1. Grade 3 stage II follicular lymphoma, status post CHOP with rituximab.  Now he has developed relapsed disease and tolerated the first cycle of salvage chemotherapy with bendamustine and rituximab without any complications. The plan is to proceed with the second cycle on 06/03/2013. I will repeat imaging studies after the second cycle to make sure that he is responding adequately. 2. IV access: A Port-A-Cath has been inserted without any complications. 3. Lower extremity swelling. This is related to lymphatic obstruction I was hoping that this will improve by now but I will proceed with chemotherapy and repeat imaging studies to insure response at this time. 4.  Next followup will be in 06/2013 before the next cycle of chemotherapy.   [facility], MD 12/11/20149:12 AM

## 2013-06-03 NOTE — Progress Notes (Signed)
1200-Pt c/o "aching pain to abdomen, 3 out of a 10".  Not a new pain for patient.  Pt takes Percocet at home for pain.  [name] notified and order received to give pt. 2 Percocet's now.  1228-Pt states that pain is gone at this time.    1418-Pt complaining of mild itching to right arm.  Few red raised hives noted.  Rituxan stopped and NS started. VS stable.  Benadryl 25 mg IV given.  [name] notified of pt.'s condition and order received to re-start Rituxan in 30 minutes if pt.'s itching has ceased.  Pt.'s family at bedside.  Pt has no other complaints at this time.  1455-Pt.'s itching has ceased and he has no complaints at this time.  VS stable.  Rituxan restarted at 350 mg/hr-144 ml/hr for 72 ml.'s.  Family at bedside.  1640-Pt started to cough and is complaining of "tightness in my throat".  VS stable.  No other complaints at this time.  [name] notified and order received to proceed as ordered.  Will continue to monitor pt closely.

## 2013-06-03 NOTE — Patient Instructions (Signed)
[facility] Discharge Instructions for Patients Receiving Chemotherapy  Today you received the following chemotherapy agents:  Rituxan and Treanda  To help prevent nausea and vomiting after your treatment, we encourage you to take your nausea medication as ordered per MD.   If you develop nausea and vomiting that is not controlled by your nausea medication, call the clinic.   BELOW ARE SYMPTOMS THAT SHOULD BE REPORTED IMMEDIATELY:  *FEVER GREATER THAN 100.5 F  *CHILLS WITH OR WITHOUT FEVER  NAUSEA AND VOMITING THAT IS NOT CONTROLLED WITH YOUR NAUSEA MEDICATION  *UNUSUAL SHORTNESS OF BREATH  *UNUSUAL BRUISING OR BLEEDING  TENDERNESS IN MOUTH AND THROAT WITH OR WITHOUT PRESENCE OF ULCERS  *URINARY PROBLEMS  *BOWEL PROBLEMS  UNUSUAL RASH Items with * indicate a potential emergency and should be followed up as soon as possible.  Feel free to call the clinic you have any questions or concerns. The clinic phone number is [phone].    

## 2013-06-03 NOTE — Telephone Encounter (Signed)
Per staff message and POF I have scheduled appts.  JMW  

## 2013-06-04 ENCOUNTER — Other Ambulatory Visit: Payer: Self-pay | Admitting: *Deleted

## 2013-06-04 ENCOUNTER — Ambulatory Visit (HOSPITAL_BASED_OUTPATIENT_CLINIC_OR_DEPARTMENT_OTHER): Payer: Medicare Other

## 2013-06-04 VITALS — BP 123/72 | HR 74 | Temp 97.6°F | Resp 18

## 2013-06-04 DIAGNOSIS — C8590 Non-Hodgkin lymphoma, unspecified, unspecified site: Secondary | ICD-10-CM

## 2013-06-04 DIAGNOSIS — Z5111 Encounter for antineoplastic chemotherapy: Secondary | ICD-10-CM

## 2013-06-04 DIAGNOSIS — C8299 Follicular lymphoma, unspecified, extranodal and solid organ sites: Secondary | ICD-10-CM

## 2013-06-04 MED ORDER — SODIUM CHLORIDE 0.9 % IV SOLN
Freq: Once | INTRAVENOUS | Status: AC
  Administered 2013-06-04: 12:00:00 via INTRAVENOUS

## 2013-06-04 MED ORDER — SODIUM CHLORIDE 0.9 % IJ SOLN
10.0000 mL | INTRAMUSCULAR | Status: DC | PRN
  Administered 2013-06-04: 10 mL
  Filled 2013-06-04: qty 10

## 2013-06-04 MED ORDER — SODIUM CHLORIDE 0.9 % IV SOLN
90.0000 mg/m2 | Freq: Once | INTRAVENOUS | Status: AC
  Administered 2013-06-04: 190 mg via INTRAVENOUS
  Filled 2013-06-04: qty 38

## 2013-06-04 MED ORDER — DEXAMETHASONE SODIUM PHOSPHATE 10 MG/ML IJ SOLN
INTRAMUSCULAR | Status: AC
  Filled 2013-06-04: qty 1

## 2013-06-04 MED ORDER — ONDANSETRON 8 MG/NS 50 ML IVPB
INTRAVENOUS | Status: AC
  Filled 2013-06-04: qty 8

## 2013-06-04 MED ORDER — HEPARIN SOD (PORK) LOCK FLUSH 100 UNIT/ML IV SOLN
500.0000 [IU] | Freq: Once | INTRAVENOUS | Status: AC | PRN
  Administered 2013-06-04: 500 [IU]
  Filled 2013-06-04: qty 5

## 2013-06-04 MED ORDER — ONDANSETRON 8 MG/50ML IVPB (CHCC)
8.0000 mg | Freq: Once | INTRAVENOUS | Status: AC
  Administered 2013-06-04: 8 mg via INTRAVENOUS

## 2013-06-04 MED ORDER — DEXAMETHASONE SODIUM PHOSPHATE 10 MG/ML IJ SOLN
10.0000 mg | Freq: Once | INTRAMUSCULAR | Status: AC
  Administered 2013-06-04: 10 mg via INTRAVENOUS

## 2013-06-04 MED ORDER — PROCHLORPERAZINE MALEATE 10 MG PO TABS: 10.0000 mg | ORAL_TABLET | Freq: Four times a day (QID) | ORAL | Status: AC | PRN

## 2013-06-04 NOTE — Progress Notes (Signed)
Discharged at 1415 with daughter via wheelchair in no distress.  Compazine order confirmed and will get this and miralax today.

## 2013-06-04 NOTE — Patient Instructions (Signed)
[facility] Discharge Instructions for Patients Receiving Chemotherapy  Today you received the following chemotherapy agents Rituxan, Treanda.  To help prevent nausea and vomiting after your treatment, we encourage you to take your nausea medication as ordered by [name].   If you develop nausea and vomiting that is not controlled by your nausea medication, call the clinic.   BELOW ARE SYMPTOMS THAT SHOULD BE REPORTED IMMEDIATELY:  *FEVER GREATER THAN 100.5 F  *CHILLS WITH OR WITHOUT FEVER  NAUSEA AND VOMITING THAT IS NOT CONTROLLED WITH YOUR NAUSEA MEDICATION  *UNUSUAL SHORTNESS OF BREATH  *UNUSUAL BRUISING OR BLEEDING  TENDERNESS IN MOUTH AND THROAT WITH OR WITHOUT PRESENCE OF ULCERS  *URINARY PROBLEMS  *BOWEL PROBLEMS  UNUSUAL RASH Items with * indicate a potential emergency and should be followed up as soon as possible.  Feel free to call the clinic you have any questions or concerns. The clinic phone number is [phone].

## 2013-06-05 ENCOUNTER — Encounter: Payer: Self-pay | Admitting: Internal Medicine

## 2013-06-05 DIAGNOSIS — R109 Unspecified abdominal pain: Secondary | ICD-10-CM

## 2013-06-05 DIAGNOSIS — K5900 Constipation, unspecified: Secondary | ICD-10-CM

## 2013-06-07 ENCOUNTER — Telehealth: Payer: Self-pay | Admitting: *Deleted

## 2013-06-07 NOTE — Telephone Encounter (Signed)
Left VM for patient to call office and ask for triage nurse.

## 2013-06-07 NOTE — Telephone Encounter (Signed)
NO NAUSEA OR VOMITING. PT. IS EATING WELL. ENCOURAGED PT. TO FORCE FLUIDS TO 64 OUNCES IN A 24 HOUR PERIOD. PT. STATES MOUTH WAS DRY AND HE HAD A SLIGHT SORE THROAT BUT THESE SYMPTOMS ARE MUCH IMPROVED. PT. HAD A PROBLEM WITH CONSTIPATION BUT HAS HAD A GOOD BOWEL MOVEMENT TODAY. ENCOURAGED PT. TO TAKE THE SENOKOT DAILY. PT. HAS THE CHEMO MEDICATION SHEET TO USE AS A GUIDE FOR POSSIBLE SIDE EFFECTS.

## 2013-06-08 ENCOUNTER — Telehealth: Payer: Self-pay

## 2013-06-08 NOTE — Telephone Encounter (Signed)
I called and spoke to Jack [name] letting him know a Clinical cytogeneticist message had been sent back to him. His daughter was sleep at the time. I let him know she can call our office when she's available. I advised him an order has been placed for an abdominal xray and if he needs to be seen we can work him in. He states he was [place] go to [place] today.

## 2013-06-13 ENCOUNTER — Other Ambulatory Visit: Payer: Self-pay | Admitting: Internal Medicine

## 2013-06-16 ENCOUNTER — Other Ambulatory Visit: Payer: Self-pay | Admitting: *Deleted

## 2013-06-16 MED ORDER — TEMAZEPAM 30 MG PO CAPS: 30.0000 mg | ORAL_CAPSULE | Freq: Every evening | ORAL | Status: DC | PRN

## 2013-06-21 ENCOUNTER — Encounter (INDEPENDENT_AMBULATORY_CARE_PROVIDER_SITE_OTHER): Payer: Medicare Other | Admitting: Ophthalmology

## 2013-06-21 DIAGNOSIS — H35039 Hypertensive retinopathy, unspecified eye: Secondary | ICD-10-CM

## 2013-06-21 DIAGNOSIS — H353290 Exudative age-related macular degeneration, unspecified eye, stage unspecified: Secondary | ICD-10-CM

## 2013-06-21 DIAGNOSIS — H3530 Unspecified macular degeneration: Secondary | ICD-10-CM

## 2013-06-21 DIAGNOSIS — I10 Essential (primary) hypertension: Secondary | ICD-10-CM

## 2013-06-21 DIAGNOSIS — H43819 Vitreous degeneration, unspecified eye: Secondary | ICD-10-CM

## 2013-06-21 NOTE — Progress Notes (Signed)
CODE STATUS full code [facility] Date of visit 03/30/2013  Patient ID: OTHELLO DICKENSON, male   DOB: February 23, 1929, 79 y.o.   MRN: [id number]  Allergies  Allergen Reactions  . Amoxicillin     REACTION: rash  . Latex Rash  . Penicillins Rash    Chief Complaint  Patient presents with  . Hospitalization Follow-up   HPI:   The patient is status post total right total knee replacement.   The patient has been admitted to [facility] for rehabilitation, mobility, strength training, and ADL management.    The patient reports some diarrhea, but reports soft formed stool this a.m.. No other verbalized concerns.    Review of Systems  Constitutional: Negative for fever and chills.  HENT: Negative for ear discharge, ear pain and nosebleeds.   Eyes: Negative for discharge and redness.  Respiratory: Positive for wheezing. Negative for hemoptysis.   Cardiovascular: Positive for leg swelling. Negative for palpitations and claudication.  Gastrointestinal: Positive for heartburn and diarrhea.       Patient reports soft formed stool this a.m.  Genitourinary: Negative for hematuria and flank pain.  Musculoskeletal: Positive for joint pain.       Status post right total knee replacement  Skin: Positive for itching.  Neurological: Negative for seizures and loss of consciousness.  Psychiatric/Behavioral: Negative for suicidal ideas and hallucinations.    Past Medical History  Diagnosis Date  . GERD (gastroesophageal reflux disease)   . Osteoarthritis   . Alcohol abuse     in recovery 2006  . 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 wine only  . Irritable bowel   . Hyperlipidemia   . Insomnia   . PAC (premature atrial contraction)     stable (ruled out for a fib)  . Prostatitis     chronic bacterial  . Pseudogout   . Sinus problem     chronic  . Cellulitis     2nd to T. Pedis(not at present)  . Hearing loss 03-10-12    bilateral hearing aids    . Complication of anesthesia 03-10-12    after shoulder  surgery -difficulty awakening,breathing problems  . Emphysema 03-10-12    tx. inhalers, uses steam room frequently  . Cramping of feet 03-10-12    cramping of both legs and hands occ.  . Non Hodgkin's lymphoma 03-10-12     '07-1 yr. in remission(Shadad)-not seeing now  . Osteoarthritis of left knee 03/17/2012  . Glaucoma   . Macular degeneration   . Hypertension   . Shortness of breath    Past Surgical History  Procedure Laterality Date  . Esophagogastroduodenoscopy  01-27-02  . Inguinal hernia repair  1979  . Torn biceps repair  03-10-12    Left rotator cuff repair  . Hernia repair    . Cataract surgery  03-10-12    03-04-12(right)/ (03-11-12-left)  . Total knee arthroplasty  03/17/2012    Procedure: TOTAL KNEE ARTHROPLASTY;  Surgeon: Eulas Post, MD;  Location: [facility];  Service: Orthopedics;  Laterality: Left;  Marland Kitchen Eye surgery  2014    cataract extraction with IOL both eyes staged  . Colonoscopy    . Rotator cuff repair Left   . Total knee arthroplasty Right 03/22/2013    Procedure: TOTAL KNEE ARTHROPLASTY;  Surgeon: Eulas Post, MD;  Location: [facility];  Service: Orthopedics;  Laterality: Right;  righ total knee arthroplasty   Current Outpatient Prescriptions on File  Prior to Visit  Medication Sig Dispense Refill  . albuterol (PROVENTIL HFA;VENTOLIN HFA) 108 (90 BASE) MCG/ACT inhaler Inhale 2 puffs into the lungs every 6 (six) hours as needed for shortness of breath.       . alfuzosin (UROXATRAL) 10 MG 24 hr tablet Take 10 mg by mouth every morning.       Marland Kitchen aspirin EC 81 MG tablet Take 81 mg by mouth every evening.       Marland Kitchen Besifloxacin HCl (BESIVANCE) 0.6 % SUSP Place 1 drop into the right eye See admin instructions. Uses for 2 days after each monthly ocular injection.  Next injection due 03/12/2013.      Marland Kitchen esomeprazole (NEXIUM) 40 MG capsule Take 40 mg by mouth daily before breakfast.      . magnesium oxide (MAG-OX) 400 MG  tablet Take 400 mg by mouth daily.      . metoprolol succinate (TOPROL-XL) 50 MG 24 hr tablet Take 75 mg by mouth every morning. Take with or immediately following a meal.      . sildenafil (VIAGRA) 100 MG tablet Take 100 mg by mouth daily as needed for erectile dysfunction.      . simvastatin (ZOCOR) 20 MG tablet Take 20 mg by mouth every evening.      . triamcinolone cream (KENALOG) 0.1 % Apply 1 application topically 2 (two) times daily as needed (rash).        No current facility-administered medications on file prior to visit.   Two six ounce glasses of wine with evening meal  Recent labs 03/28/2013 WBC 8.5 RBC 3.53 Hemoglobin 11.0 Hematocrit 31.9 MCV 90 MCHC 34.6 RDW 13.9 Platelets 220 Differential within normal limits  Glucose 105 BUN 23 Creatinine 1.09 Sodium 132 Potassium 4.6 Chloride 97 CO2 27 Calcium 8.8  Cholesterol 146 Triglycerides 168 HDL 46 VLDL 34 LDL 66  BP 161/09  Pulse 82  Temp(Src) 98.1 F (36.7 C)  Resp 18  Physical examination:    Pupils are equally round and reactive to light, question of some exophthalmus. Extraocular movements are intact. Conjunctiva are pale pink. No true icteric findings.    Tympanic membranes unremarkable.    Oropharynx, teeth in excellent repair, no gross oral lesions.    No palpable cervical adenopathy, no apparent carotid bruits.    Heart sounds intermittently irregular, otherwise unremarkable.    Bilateral breath sounds are clear but somewhat diminished.    Abdomen positive bowel sounds otherwise soft and nontender. Examination of the right upper quadrant, firm liver edge is easily palpable with deep inspiration.    Pitting edema bilaterally 2-3+, more significant on the right, operative side. Positive for hemosiderin staining bilaterally. A dry dressing continues over the right knee. He certainly has had no erythema of the surrounding area.    Posterior tibial pulses palpable bilaterally.     Assessment/plan:    Concerned for the pitting edema. The patient consistently sits with his legs hanging down. Have discussed issue of gravity. Have discussed with patient a trial of Lasix may be necessary to address the edema but staying with his legs in a dependent position would not prevent reaccumulation of any edema Discuss with patient option of compression wrapping versus utilization of his TED hose as well.    Related to patient's history of alcohol abuse and ongoing alcohol use, we'll check his liver panel in the morning.    Emphysema, continue the Advair 500/50 once a day, and the Proventil HFA inhaler, 2 puffs every 6 hours  as necessary for shortness of breath or wheezing.  Continue to monitor  Pain, postoperative, continue oxycodone/acetaminophen 10/325 one tablet every 6 hours as necessary for pain.  GERD continue Nexium 40 mg per day, continue to monitor  Hyperlipidemia, will continue the Zocor 20 mg each evening, continue to monitor  Hypertension, Toprol-XL 75 mg per day, continue to monitor  Magnesium oxide, 400 mg per day, continue to monitor  Pseudogout, continue Uroxatral 10 mg per day, continue to monitor  We'll continue to follow this patient over time.

## 2013-06-25 ENCOUNTER — Other Ambulatory Visit: Payer: Self-pay | Admitting: Internal Medicine

## 2013-06-25 ENCOUNTER — Other Ambulatory Visit: Payer: Self-pay | Admitting: *Deleted

## 2013-06-25 DIAGNOSIS — C8589 Other specified types of non-Hodgkin lymphoma, extranodal and solid organ sites: Secondary | ICD-10-CM

## 2013-06-25 MED ORDER — OXYCODONE-ACETAMINOPHEN 10-325 MG PO TABS: 1.0000 | ORAL_TABLET | Freq: Four times a day (QID) | ORAL | Status: DC | PRN

## 2013-06-25 NOTE — Telephone Encounter (Signed)
Patient calling for a refill on oxycodone, will run out before nest appt with dr Alen Blew. Signed script left at front for patient p/u. patient notified.

## 2013-06-28 NOTE — Progress Notes (Signed)
CODE STATUS full code [name] Date of visit  04/08/2013  Patient ID: Jack [name], male   DOB: [birth year], 80 y.o.   MRN: [id number]  Allergies  Allergen Reactions  . Amoxicillin     REACTION: rash  . Latex Rash  . Penicillins Rash    Chief Complaint  Patient presents with  . Discharge Note   HPI:   The patient is status post total right total knee replacement.   The patient has been admitted to [facility] for rehabilitation, mobility, strength training, and ADL management.    His medical history is significant for RBBB, emphysema, alcoholic cirrhosis of liver, L knee osteoarthritis, Non-Hodgkin's  lymphoma, GERD,    Review of Systems  Constitutional: Negative for fever and chills.  HENT: Negative for ear discharge, ear pain and nosebleeds.   Eyes: Negative for discharge and redness.  Respiratory: Positive for wheezing. Negative for hemoptysis.   Cardiovascular: Positive for leg swelling. Negative for palpitations and claudication.  Gastrointestinal: Positive for heartburn and diarrhea.       Patient reports soft formed stool this a.m.  Genitourinary: Negative for hematuria and flank pain.  Musculoskeletal: Positive for joint pain.       Status post right total knee replacement  Skin: Positive for itching.  Neurological: Negative for seizures and loss of consciousness.  Psychiatric/Behavioral: Negative for suicidal ideas and hallucinations.    Past Medical History  Diagnosis Date  . GERD (gastroesophageal reflux disease)   . Osteoarthritis   . Alcohol abuse     in recovery 2006  . 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 wine only  . Irritable bowel   . Hyperlipidemia   . Insomnia   . PAC (premature atrial contraction)     stable (ruled out for a fib)  . Prostatitis     chronic bacterial  . Pseudogout   . Sinus problem     chronic  . Cellulitis     2nd to T. Pedis(not at present)  . Hearing loss 03-10-12   bilateral hearing aids  . Complication of anesthesia 03-10-12    after shoulder  surgery -difficulty awakening,breathing problems  . Emphysema 03-10-12    tx. inhalers, uses steam room frequently  . Cramping of feet 03-10-12    cramping of both legs and hands occ.  . Non Hodgkin's lymphoma 03-10-12     '07-1 yr. in remission(Shadad)-not seeing now  . Osteoarthritis of left knee 03/17/2012  . Glaucoma   . Macular degeneration   . Hypertension   . Shortness of breath    Past Surgical History  Procedure Laterality Date  . Esophagogastroduodenoscopy  01-27-02  . Inguinal hernia repair  1979  . Torn biceps repair  03-10-12    Left rotator cuff repair  . Hernia repair    . Cataract surgery  03-10-12    03-04-12(right)/ (03-11-12-left)  . Total knee arthroplasty  03/17/2012    Procedure: TOTAL KNEE ARTHROPLASTY;  Surgeon: Johnny Bridge, MD;  Location: [facility];  Service: Orthopedics;  Laterality: Left;  Marland Kitchen Eye surgery  2014    cataract extraction with IOL both eyes staged  . Colonoscopy    . Rotator cuff repair Left   . Total knee arthroplasty Right 03/22/2013    Procedure: TOTAL KNEE ARTHROPLASTY;  Surgeon: Johnny Bridge, MD;  Location: [place];  Service: Orthopedics;  Laterality: Right;  righ total knee arthroplasty   Current Outpatient Prescriptions on File Prior  to Visit  Medication Sig Dispense Refill  . albuterol (PROVENTIL HFA;VENTOLIN HFA) 108 (90 BASE) MCG/ACT inhaler Inhale 2 puffs into the lungs every 6 (six) hours as needed for shortness of breath.       . alfuzosin (UROXATRAL) 10 MG 24 hr tablet Take 10 mg by mouth every morning.       Marland Kitchen aspirin EC 81 MG tablet Take 81 mg by mouth every evening.       Marland Kitchen Besifloxacin HCl (BESIVANCE) 0.6 % SUSP Place 1 drop into the right eye See admin instructions. Uses for 2 days after each monthly ocular injection.  Next injection due 03/12/2013.      Marland Kitchen esomeprazole (NEXIUM) 40 MG capsule Take 40 mg by mouth daily before breakfast.      . magnesium  oxide (MAG-OX) 400 MG tablet Take 400 mg by mouth daily.      . metoprolol succinate (TOPROL-XL) 50 MG 24 hr tablet Take 75 mg by mouth every morning. Take with or immediately following a meal.      . sildenafil (VIAGRA) 100 MG tablet Take 100 mg by mouth daily as needed for erectile dysfunction.      . simvastatin (ZOCOR) 20 MG tablet Take 20 mg by mouth every evening.      . triamcinolone cream (KENALOG) 0.1 % Apply 1 application topically 2 (two) times daily as needed (rash).        No current facility-administered medications on file prior to visit.   Two six ounce glasses of wine with evening meal  Recent labs 03/28/2013 WBC 8.5 RBC 3.53 Hemoglobin 11.0 Hematocrit 31.9 MCV 90 MCHC 34.6 RDW 13.9 Platelets 220 Differential within normal limits  Glucose 105 BUN 23 Creatinine 1.09 Sodium 132 Potassium 4.6 Chloride 97 CO2 27 Calcium 8.8  Cholesterol 146 Triglycerides 168 HDL 46 VLDL 34 LDL 66  BP 128/62  Pulse 74  Temp(Src) 98.1 F (36.7 C)  Resp 16  Physical examination:    Pupils are equally round and reactive to light, question of some exophthalmus. Extraocular movements are intact. Conjunctiva are pale pink. No true icteric findings.    Tympanic membranes unremarkable.    Oropharynx, teeth in excellent repair, no gross oral lesions.    No palpable cervical adenopathy, no apparent carotid bruits.    Heart sounds intermittently irregular, otherwise unremarkable.    Bilateral breath sounds are clear but somewhat diminished.    Abdomen positive bowel sounds otherwise soft and nontender. Examination of the right upper quadrant, firm liver edge is easily palpable with deep inspiration.    Pitting edema bilaterally, medial ankle, 2-3+, more significant on the right, operative side.  Bilateral edema ending well below the knees. Positive for hemosiderin staining bilaterally.   Site of R total knee arthroplasty, most of the Steri-Strips have fallen off.   Incision is well healed, no erythema, no evidence of any drainage.  Posterior tibial pulses palpable bilaterally.    Assessment/plan:    Pt to be d/c'ed home with services of OT/PT.  He will have assistance from his family.  Edema, pt would still likely still benefit from application of cold to the operative area.  He has been instructed the importance of lower extremity elevation when sitting.  Will be followed by his PCP   Related to patient's history of alcohol abuse and ongoing alcohol use, will have ongoing follow-up by his PCP  Emphysema, continue the Advair 500/50 once a day, and the Proventil HFA inhaler, 2 puffs every 6 hours  as necessary for shortness of breath or wheezing.    Pain, postoperative, continue oxycodone/acetaminophen 10/325 one tablet every 6 hours as necessary for pain.  GERD continue Nexium 40 mg per day, continue to monitor  Hyperlipidemia, will continue the Zocor 20 mg each evening  Hypertension, Toprol-XL 75 mg per day  Magnesium oxide, 400 mg per day,   Pseudogout, continue Uroxatral 10 mg per day  History of Non-Hodgkin's lymphoma, will be followed by his Oncologist  No noted contraindications for facility d/c.

## 2013-06-29 ENCOUNTER — Other Ambulatory Visit: Payer: Medicare Other

## 2013-06-29 ENCOUNTER — Encounter (HOSPITAL_COMMUNITY): Payer: Self-pay

## 2013-06-29 ENCOUNTER — Other Ambulatory Visit (HOSPITAL_BASED_OUTPATIENT_CLINIC_OR_DEPARTMENT_OTHER): Payer: Medicare Other

## 2013-06-29 ENCOUNTER — Ambulatory Visit (HOSPITAL_COMMUNITY)
Admission: RE | Admit: 2013-06-29 | Discharge: 2013-06-29 | Disposition: A | Discharge status: Home / Self Care | Payer: Medicare Other | Source: Ambulatory Visit | Attending: Oncology | Admitting: Oncology

## 2013-06-29 DIAGNOSIS — C8590 Non-Hodgkin lymphoma, unspecified, unspecified site: Secondary | ICD-10-CM

## 2013-06-29 DIAGNOSIS — Z79899 Other long term (current) drug therapy: Secondary | ICD-10-CM | POA: Insufficient documentation

## 2013-06-29 DIAGNOSIS — R599 Enlarged lymph nodes, unspecified: Secondary | ICD-10-CM | POA: Insufficient documentation

## 2013-06-29 DIAGNOSIS — C8299 Follicular lymphoma, unspecified, extranodal and solid organ sites: Secondary | ICD-10-CM

## 2013-06-29 DIAGNOSIS — R609 Edema, unspecified: Secondary | ICD-10-CM | POA: Insufficient documentation

## 2013-06-29 DIAGNOSIS — C8589 Other specified types of non-Hodgkin lymphoma, extranodal and solid organ sites: Secondary | ICD-10-CM | POA: Insufficient documentation

## 2013-06-29 DIAGNOSIS — N281 Cyst of kidney, acquired: Secondary | ICD-10-CM | POA: Insufficient documentation

## 2013-06-29 DIAGNOSIS — J984 Other disorders of lung: Secondary | ICD-10-CM | POA: Insufficient documentation

## 2013-06-29 LAB — CBC WITH DIFFERENTIAL/PLATELET
BASO%: 0.8 % (ref 0.0–2.0)
BASOS ABS: 0.1 10*3/uL (ref 0.0–0.1)
EOS ABS: 0.2 10*3/uL (ref 0.0–0.5)
EOS%: 2.3 % (ref 0.0–7.0)
HEMATOCRIT: 29.7 % — AB (ref 38.4–49.9)
HEMOGLOBIN: 9.7 g/dL — AB (ref 13.0–17.1)
LYMPH#: 3.2 10*3/uL (ref 0.9–3.3)
LYMPH%: 31.7 % (ref 14.0–49.0)
MCH: 27.8 pg (ref 27.2–33.4)
MCHC: 32.9 g/dL (ref 32.0–36.0)
MCV: 84.5 fL (ref 79.3–98.0)
MONO#: 1.8 10*3/uL — AB (ref 0.1–0.9)
MONO%: 17.3 % — ABNORMAL HIGH (ref 0.0–14.0)
NEUT%: 47.9 % (ref 39.0–75.0)
NEUTROS ABS: 4.9 10*3/uL (ref 1.5–6.5)
PLATELETS: 174 10*3/uL (ref 140–400)
RBC: 3.51 10*6/uL — ABNORMAL LOW (ref 4.20–5.82)
RDW: 16.5 % — ABNORMAL HIGH (ref 11.0–14.6)
WBC: 10.2 10*3/uL (ref 4.0–10.3)

## 2013-06-29 LAB — COMPREHENSIVE METABOLIC PANEL (CC13)
ALT: 11 U/L (ref 0–55)
AST: 38 U/L — ABNORMAL HIGH (ref 5–34)
Albumin: 3.2 g/dL — ABNORMAL LOW (ref 3.5–5.0)
Alkaline Phosphatase: 82 U/L (ref 40–150)
Anion Gap: 9 mEq/L (ref 3–11)
BUN: 17.8 mg/dL (ref 7.0–26.0)
CALCIUM: 9.7 mg/dL (ref 8.4–10.4)
CHLORIDE: 92 meq/L — AB (ref 98–109)
CO2: 29 mEq/L (ref 22–29)
CREATININE: 0.9 mg/dL (ref 0.7–1.3)
GLUCOSE: 103 mg/dL (ref 70–140)
Potassium: 4.9 mEq/L (ref 3.5–5.1)
Sodium: 130 mEq/L — ABNORMAL LOW (ref 136–145)
Total Bilirubin: 0.51 mg/dL (ref 0.20–1.20)
Total Protein: 6.5 g/dL (ref 6.4–8.3)

## 2013-06-29 MED ORDER — IOHEXOL 300 MG/ML  SOLN
100.0000 mL | Freq: Once | INTRAMUSCULAR | Status: AC | PRN
  Administered 2013-06-29: 100 mL via INTRAVENOUS

## 2013-07-01 ENCOUNTER — Ambulatory Visit (HOSPITAL_BASED_OUTPATIENT_CLINIC_OR_DEPARTMENT_OTHER): Payer: Medicare Other | Admitting: Oncology

## 2013-07-01 ENCOUNTER — Ambulatory Visit (HOSPITAL_BASED_OUTPATIENT_CLINIC_OR_DEPARTMENT_OTHER): Payer: Medicare Other

## 2013-07-01 ENCOUNTER — Other Ambulatory Visit: Payer: Self-pay | Admitting: Oncology

## 2013-07-01 ENCOUNTER — Telehealth: Payer: Self-pay | Admitting: Oncology

## 2013-07-01 VITALS — BP 121/73 | HR 94 | Temp 97.9°F | Resp 18

## 2013-07-01 VITALS — BP 114/55 | HR 97 | Temp 97.0°F | Resp 17 | Ht 71.0 in | Wt 185.2 lb

## 2013-07-01 DIAGNOSIS — C8299 Follicular lymphoma, unspecified, extranodal and solid organ sites: Secondary | ICD-10-CM | POA: Diagnosis not present

## 2013-07-01 DIAGNOSIS — Z5111 Encounter for antineoplastic chemotherapy: Secondary | ICD-10-CM

## 2013-07-01 DIAGNOSIS — M7989 Other specified soft tissue disorders: Secondary | ICD-10-CM

## 2013-07-01 DIAGNOSIS — C8589 Other specified types of non-Hodgkin lymphoma, extranodal and solid organ sites: Secondary | ICD-10-CM

## 2013-07-01 DIAGNOSIS — Z5112 Encounter for antineoplastic immunotherapy: Secondary | ICD-10-CM

## 2013-07-01 DIAGNOSIS — C8590 Non-Hodgkin lymphoma, unspecified, unspecified site: Secondary | ICD-10-CM

## 2013-07-01 MED ORDER — DEXAMETHASONE SODIUM PHOSPHATE 10 MG/ML IJ SOLN
INTRAMUSCULAR | Status: AC
  Filled 2013-07-01: qty 1

## 2013-07-01 MED ORDER — SODIUM CHLORIDE 0.9 % IV SOLN
Freq: Once | INTRAVENOUS | Status: AC
  Administered 2013-07-01: 10:00:00 via INTRAVENOUS

## 2013-07-01 MED ORDER — ONDANSETRON 8 MG/50ML IVPB (CHCC)
8.0000 mg | Freq: Once | INTRAVENOUS | Status: AC
  Administered 2013-07-01: 8 mg via INTRAVENOUS

## 2013-07-01 MED ORDER — SODIUM CHLORIDE 0.9 % IJ SOLN
10.0000 mL | INTRAMUSCULAR | Status: DC | PRN
  Administered 2013-07-01: 10 mL
  Filled 2013-07-01: qty 10

## 2013-07-01 MED ORDER — DIPHENHYDRAMINE HCL 25 MG PO CAPS
50.0000 mg | ORAL_CAPSULE | Freq: Once | ORAL | Status: AC
  Administered 2013-07-01: 50 mg via ORAL

## 2013-07-01 MED ORDER — DEXAMETHASONE SODIUM PHOSPHATE 10 MG/ML IJ SOLN
10.0000 mg | Freq: Once | INTRAMUSCULAR | Status: AC
  Administered 2013-07-01: 10 mg via INTRAVENOUS

## 2013-07-01 MED ORDER — SODIUM CHLORIDE 0.9 % IV SOLN
375.0000 mg/m2 | Freq: Once | INTRAVENOUS | Status: AC
  Administered 2013-07-01: 800 mg via INTRAVENOUS
  Filled 2013-07-01: qty 80

## 2013-07-01 MED ORDER — ACETAMINOPHEN 325 MG PO TABS
ORAL_TABLET | ORAL | Status: AC
  Filled 2013-07-01: qty 2

## 2013-07-01 MED ORDER — HEPARIN SOD (PORK) LOCK FLUSH 100 UNIT/ML IV SOLN
500.0000 [IU] | Freq: Once | INTRAVENOUS | Status: AC | PRN
  Administered 2013-07-01: 500 [IU]
  Filled 2013-07-01: qty 5

## 2013-07-01 MED ORDER — ONDANSETRON 8 MG/NS 50 ML IVPB
INTRAVENOUS | Status: AC
  Filled 2013-07-01: qty 8

## 2013-07-01 MED ORDER — ACETAMINOPHEN 325 MG PO TABS
650.0000 mg | ORAL_TABLET | Freq: Once | ORAL | Status: AC
  Administered 2013-07-01: 650 mg via ORAL

## 2013-07-01 MED ORDER — SENNOSIDES-DOCUSATE SODIUM 8.6-50 MG PO TABS: 1.0000 | ORAL_TABLET | Freq: Two times a day (BID) | ORAL | Status: AC

## 2013-07-01 MED ORDER — DIPHENHYDRAMINE HCL 25 MG PO CAPS
ORAL_CAPSULE | ORAL | Status: AC
  Filled 2013-07-01: qty 2

## 2013-07-01 MED ORDER — SODIUM CHLORIDE 0.9 % IV SOLN
90.0000 mg/m2 | Freq: Once | INTRAVENOUS | Status: AC
  Administered 2013-07-01: 189 mg via INTRAVENOUS
  Filled 2013-07-01: qty 2.1

## 2013-07-01 NOTE — Patient Instructions (Signed)
[facility] Discharge Instructions for Patients Receiving Chemotherapy  Today you received the following chemotherapy agents Rituxan, Treanda To help prevent nausea and vomiting after your treatment, we encourage you to take your nausea medication as prescribed.  If you develop nausea and vomiting that is not controlled by your nausea medication, call the clinic.   BELOW ARE SYMPTOMS THAT SHOULD BE REPORTED IMMEDIATELY:  *FEVER GREATER THAN 100.5 F  *CHILLS WITH OR WITHOUT FEVER  NAUSEA AND VOMITING THAT IS NOT CONTROLLED WITH YOUR NAUSEA MEDICATION  *UNUSUAL SHORTNESS OF BREATH  *UNUSUAL BRUISING OR BLEEDING  TENDERNESS IN MOUTH AND THROAT WITH OR WITHOUT PRESENCE OF ULCERS  *URINARY PROBLEMS  *BOWEL PROBLEMS  UNUSUAL RASH Items with * indicate a potential emergency and should be followed up as soon as possible.  Feel free to call the clinic should you have any questions or concerns. The clinic phone number is [phone].  It was our pleasure to serve you today!

## 2013-07-01 NOTE — Progress Notes (Signed)
Hematology and Oncology Follow Up Visit  Jack [name] [id number] [birth year] 80 y.o. 07/01/2013 9:49 AM   Principle Diagnosis: This is a 80 year old gentleman with grade 3 stage II follicular lymphoma diagnosed in 2007. He was in remission until November of 2014 with a presented with a relapse pelvic disease and lower remedy edema  Prior Therapy: Patient treated with 6 cycles of CHOP with rituximab.  Therapy concluded August 2007.  He had a complete response and had been in remission since that time.  Current therapy: Salvage chemotherapy status post the first cycle of bendamustine and rituximab. He will be due for the third cycle on 07/01/2013.  Interim History: Jack [name] presents today for a followup visit.  Since his last visit, he has been doing about the same with continuous left lower extremity swelling. He still ambulatory with the help of a walker and a cane. Patient tolerated the second cycle of chemotherapy of rituximab and bendamustine that was overall well tolerated. He did not develop infusion-related complications from rituximab. He still have episodic abdominal discomfort and occasional constipation. But otherwise has not had any recent hospitalization or illnesses. He reports that about 2-3 weeks after each cycle of chemotherapy feels a lot better and the swelling decreases dramatically but the week leading up to the next cycle of chemotherapy the swelling returns as well as abdominal fullness. He uses oxycodone for pain control which helps with his symptoms.  Medications: Current Outpatient Prescriptions  Medication Sig Dispense Refill  . albuterol (PROVENTIL HFA;VENTOLIN HFA) 108 (90 BASE) MCG/ACT inhaler Inhale 2 puffs into the lungs every 6 (six) hours as needed for shortness of breath.       . alfuzosin (UROXATRAL) 10 MG 24 hr tablet Take 10 mg by mouth every morning.       Marland Kitchen allopurinol (ZYLOPRIM) 100 MG tablet Take 100 mg by mouth daily.      Marland Kitchen aspirin EC 81 MG tablet Take  81 mg by mouth every evening.       Marland Kitchen Besifloxacin HCl (BESIVANCE) 0.6 % SUSP Place 1 drop into the right eye See admin instructions. Uses for 2 days after each monthly ocular injection.  Next injection due 03/12/2013.      Marland Kitchen esomeprazole (NEXIUM) 40 MG capsule Take 40 mg by mouth daily before breakfast.      . famciclovir (FAMVIR) 500 MG tablet Take 250 mg by mouth daily.      . Fluticasone-Salmeterol (ADVAIR) 500-50 MCG/DOSE AEPB Inhale 1 puff into the lungs 2 (two) times daily.      . hyoscyamine (LEVSIN SL) 0.125 MG SL tablet dissolve 1 tablet under the tongue every 2 hours if needed for ABDOMINAL CRAMPS  48 tablet  1  . lidocaine-prilocaine (EMLA) cream Apply 1 application topically as needed. Apply to port before chemotherapy.      . magnesium oxide (MAG-OX) 400 MG tablet Take 400 mg by mouth daily.      . Menthol, Topical Analgesic, 10 % LIQD Apply 1 application topically 3 (three) times daily as needed (pain).      . metoprolol succinate (TOPROL-XL) 25 MG 24 hr tablet take 3 tablets by mouth every morning with food  90 tablet  5  . metoprolol succinate (TOPROL-XL) 50 MG 24 hr tablet Take 75 mg by mouth every morning. Take with or immediately following a meal.      . Multiple Vitamins-Minerals (PRESERVISION AREDS PO) Take 1 tablet by mouth 2 (two) times daily.      Marland Kitchen  oxyCODONE-acetaminophen (PERCOCET) 10-325 MG per tablet Take 1-2 tablets by mouth every 6 (six) hours as needed for pain. MAXIMUM TOTAL ACETAMINOPHEN DOSE IS 4000 MG PER DAY  30 tablet  0  . potassium chloride SA (K-DUR,KLOR-CON) 20 MEQ tablet take 1 tablet by mouth once daily  30 tablet  5  . prochlorperazine (COMPAZINE) 10 MG tablet Take 1 tablet (10 mg total) by mouth every 6 (six) hours as needed for nausea or vomiting.  30 tablet  2  . psyllium (METAMUCIL) 58.6 % packet Take 1 packet by mouth daily.      . quiNINE (QUALAQUIN) 324 MG capsule Take 324 mg by mouth 2 (two) times daily.      Marland Kitchen senna-docusate (SENOKOT-S) 8.6-50 MG  per tablet Take 1 tablet by mouth 2 (two) times daily.  60 tablet  1  . sennosides-docusate sodium (SENOKOT-S) 8.6-50 MG tablet Take 2 tablets by mouth daily as needed for constipation.      . sildenafil (VIAGRA) 100 MG tablet Take 100 mg by mouth daily as needed for erectile dysfunction.      . simvastatin (ZOCOR) 20 MG tablet Take 20 mg by mouth every evening.      . temazepam (RESTORIL) 30 MG capsule Take 1 capsule (30 mg total) by mouth at bedtime as needed for sleep.  30 capsule  5  . traZODone (DESYREL) 50 MG tablet Take 1 tablet (50 mg total) by mouth at bedtime as needed for sleep.  30 tablet  3  . triamcinolone cream (KENALOG) 0.1 % Apply 1 application topically 2 (two) times daily as needed (rash).        No current facility-administered medications for this visit.    Allergies:  Allergies  Allergen Reactions  . Amoxicillin     REACTION: rash  . Latex Rash  . Penicillins Rash    Past Medical History, Surgical history, Social history, and Family History were reviewed and updated.  Review of Systems:  Remaining ROS negative. Physical Exam: Blood pressure 114/55, pulse 97, temperature 97 F (36.1 C), temperature source Oral, resp. rate 17, height 5\' 11"  (1.803 m), weight 185 lb 3.2 oz (84.006 kg), SpO2 100.00%. ECOG: 1  General appearance: alert Head: Normocephalic, without obvious abnormality, atraumatic Neck: no adenopathy, no carotid bruit, no JVD, supple, symmetrical, trachea midline and thyroid not enlarged, symmetric, no tenderness/mass/nodules Lymph nodes: Cervical, supraclavicular, and axillary nodes normal. Heart:regular rate and rhythm, S1, S2 normal, no murmur, click, rub or gallop Lung:chest clear, no wheezing, rales, normal symmetric air entry Abdomin: soft, non-tender, without masses or organomegaly EXT:no erythema, induration, or nodules   Lab Results: Lab Results  Component Value Date   WBC 10.2 06/29/2013   HGB 9.7* 06/29/2013   HCT 29.7* 06/29/2013    MCV 84.5 06/29/2013   PLT 174 06/29/2013     Chemistry      Component Value Date/Time   NA 130* 06/29/2013 1313   NA 132* 05/06/2013 0545   K 4.9 06/29/2013 1313   K 4.3 05/06/2013 0545   CL 98 05/06/2013 0545   CL 104 04/22/2012 1310   CO2 29 06/29/2013 1313   CO2 25 05/06/2013 0545   BUN 17.8 06/29/2013 1313   BUN 8 05/06/2013 0545   CREATININE 0.9 06/29/2013 1313   CREATININE 0.78 05/06/2013 0545      Component Value Date/Time   CALCIUM 9.7 06/29/2013 1313   CALCIUM 9.5 05/06/2013 0545   ALKPHOS 82 06/29/2013 1313   ALKPHOS 64 05/04/2013 1200  AST 38* 06/29/2013 1313   AST 30 05/04/2013 1200   ALT 11 06/29/2013 1313   ALT 14 05/04/2013 1200   BILITOT 0.51 06/29/2013 1313   BILITOT 0.3 05/04/2013 1200     EXAM:  CT CHEST, ABDOMEN, AND PELVIS WITH CONTRAST  TECHNIQUE:  Multidetector CT imaging of the chest, abdomen and pelvis was  performed following the standard protocol during bolus  administration of intravenous contrast.  CONTRAST: 166mL OMNIPAQUE IOHEXOL 300 MG/ML SOLN  COMPARISON: CT ABD/PELVIS W CM dated 05/04/2013  FINDINGS:  CT CHEST FINDINGS  There is a port in the right anterior chest wall. No axillary or  supraclavicular lymphadenopathy. No mediastinal hilar  lymphadenopathy. No pericardial fluid. No central pulmonary  embolism.  Review of the lung parenchyma demonstrates no suspicious pulmonary  nodules. A calcified pulmonary nodules noted.  CT ABDOMEN AND PELVIS FINDINGS  Extensive adenopathy within the left retroperitoneum and along the  course of the left iliac vessels is again demonstrated. A  conglomerate mass surrounds the left external iliac vessels measures  11 by 6.3 cm increased from 9 x 6.1 cm on prior. Retroperitoneal  mass posterior to the left psoas muscle measuring 4.1 cm compared to  3.4 cm on prior. Left common iliac station node measures 3.2 cm  compared to 2.0 cm on prior.  No focal hepatic lesion. Gallbladder is normal. Pancreas is normal.  The  spleen is normal volume with multiple granulomata. Adrenal  glands and kidneys are normal. There is low-density cyst extending  medial the left kidney is nonenhancing.  Stomach, small bowel, colon are unremarkable.  There is no hydronephrosis although the left ureter does pass  adjacent to the iliac fossa mass.  No free fluid the pelvis. The prostate gland and bladder normal. No  aggressive osseous lesion.  There is soft tissue swelling of the proximal left lower extremity.  This is likely related to venous compromise.  IMPRESSION:  1. Advanced of bulky adenopathy within the left iliac fossa  surrounding the iliac vessels.  2. Advancement of bulky left periaortic adenopathy and  retroperitoneal mass lateral to the psoas.  3. No evidence metastasis within the thorax.  4. Normal spleen.  5. Edema within the left lower extremity likely related to venous or  lymphatic insufficiency from the left iliac fossa mass.   Impression and Plan:  A pleasant 80 year old gentleman with the following issues:   1. Grade 3 stage II follicular lymphoma, status post CHOP with rituximab.  Now he has developed relapsed disease and tolerated the first 2 cycle of salvage chemotherapy with bendamustine and rituximab without any complications. CT scan results from 06/29/2013 were discussed with the patient and his family. Clearly chemotherapy is not doing the desired effect with continuous increase in this adenopathy. However, he is benefiting clinically with improvement in his abdominal pain or lower edema at least transiently. At this point, I favor changing his chemotherapy but I like to do it after obtaining a biopsy of these enlarging lymphocytes. This will be arranged for January 16 and in the meantime we'll proceed with the current regimen as it offers him some clinical benefit. This chemotherapy regimen will be adjusted depending on the results of the biopsy. I've also discuss with them the role of radiation  therapy as an option as well. 2. IV access: A Port-A-Cath has been inserted without any complications. 3. Lower extremity swelling. This is related to lymphatic obstruction.  4. Followup: Will be in 3 weeks' time and potentially for the  next cycle of chemotherapy.  [facility], MD 1/8/20159:49 AM

## 2013-07-01 NOTE — Addendum Note (Signed)
Addended by: Randolm Idol on: 07/01/2013 10:15 AM   Modules accepted: Medications

## 2013-07-01 NOTE — Telephone Encounter (Signed)
Gave pt appt for lab and md for 07/22/13

## 2013-07-02 ENCOUNTER — Ambulatory Visit (HOSPITAL_BASED_OUTPATIENT_CLINIC_OR_DEPARTMENT_OTHER): Payer: Medicare Other

## 2013-07-02 ENCOUNTER — Other Ambulatory Visit: Admitting: Radiology

## 2013-07-02 ENCOUNTER — Encounter: Payer: Self-pay | Admitting: Oncology

## 2013-07-02 VITALS — BP 142/62 | HR 95 | Temp 97.6°F | Resp 20

## 2013-07-02 DIAGNOSIS — C8299 Follicular lymphoma, unspecified, extranodal and solid organ sites: Secondary | ICD-10-CM

## 2013-07-02 DIAGNOSIS — C8590 Non-Hodgkin lymphoma, unspecified, unspecified site: Secondary | ICD-10-CM

## 2013-07-02 DIAGNOSIS — Z5111 Encounter for antineoplastic chemotherapy: Secondary | ICD-10-CM

## 2013-07-02 MED ORDER — SODIUM CHLORIDE 0.9 % IV SOLN
Freq: Once | INTRAVENOUS | Status: AC
  Administered 2013-07-02: 11:00:00 via INTRAVENOUS

## 2013-07-02 MED ORDER — SODIUM CHLORIDE 0.9 % IJ SOLN
10.0000 mL | INTRAMUSCULAR | Status: DC | PRN
  Administered 2013-07-02: 10 mL
  Filled 2013-07-02: qty 10

## 2013-07-02 MED ORDER — DEXAMETHASONE SODIUM PHOSPHATE 10 MG/ML IJ SOLN
INTRAMUSCULAR | Status: AC
  Filled 2013-07-02: qty 1

## 2013-07-02 MED ORDER — BENDAMUSTINE HCL CHEMO INJECTION 180 MG/2ML
90.0000 mg/m2 | Freq: Once | INTRAVENOUS | Status: AC
  Administered 2013-07-02: 189 mg via INTRAVENOUS
  Filled 2013-07-02: qty 2.1

## 2013-07-02 MED ORDER — HEPARIN SOD (PORK) LOCK FLUSH 100 UNIT/ML IV SOLN
500.0000 [IU] | Freq: Once | INTRAVENOUS | Status: AC | PRN
  Administered 2013-07-02: 500 [IU]
  Filled 2013-07-02: qty 5

## 2013-07-02 MED ORDER — DEXAMETHASONE SODIUM PHOSPHATE 10 MG/ML IJ SOLN
10.0000 mg | Freq: Once | INTRAMUSCULAR | Status: AC
  Administered 2013-07-02: 10 mg via INTRAVENOUS

## 2013-07-02 MED ORDER — ONDANSETRON 8 MG/NS 50 ML IVPB
INTRAVENOUS | Status: AC
Start: 2013-07-02 — End: 2013-07-02
  Filled 2013-07-02: qty 8

## 2013-07-02 MED ORDER — ONDANSETRON 8 MG/50ML IVPB (CHCC)
8.0000 mg | Freq: Once | INTRAVENOUS | Status: AC
  Administered 2013-07-02: 8 mg via INTRAVENOUS

## 2013-07-02 NOTE — Patient Instructions (Signed)
[facility] Discharge Instructions for Patients Receiving Chemotherapy  Today you received the following chemotherapy agent: Treanda   To help prevent nausea and vomiting after your treatment, we encourage you to take your nausea medication as prescribed.    If you develop nausea and vomiting that is not controlled by your nausea medication, call the clinic.   BELOW ARE SYMPTOMS THAT SHOULD BE REPORTED IMMEDIATELY:  *FEVER GREATER THAN 100.5 F  *CHILLS WITH OR WITHOUT FEVER  NAUSEA AND VOMITING THAT IS NOT CONTROLLED WITH YOUR NAUSEA MEDICATION  *UNUSUAL SHORTNESS OF BREATH  *UNUSUAL BRUISING OR BLEEDING  TENDERNESS IN MOUTH AND THROAT WITH OR WITHOUT PRESENCE OF ULCERS  *URINARY PROBLEMS  *BOWEL PROBLEMS  UNUSUAL RASH Items with * indicate a potential emergency and should be followed up as soon as possible.  Feel free to call the clinic you have any questions or concerns. The clinic phone number is [phone].    

## 2013-07-08 ENCOUNTER — Other Ambulatory Visit: Payer: Self-pay | Admitting: Radiology

## 2013-07-09 ENCOUNTER — Ambulatory Visit (HOSPITAL_COMMUNITY)
Admission: RE | Admit: 2013-07-09 | Discharge: 2013-07-09 | Disposition: A | Discharge status: Home / Self Care | Payer: Medicare Other | Source: Ambulatory Visit | Attending: Oncology | Admitting: Oncology

## 2013-07-09 ENCOUNTER — Encounter (HOSPITAL_COMMUNITY): Payer: Self-pay

## 2013-07-09 DIAGNOSIS — J438 Other emphysema: Secondary | ICD-10-CM | POA: Insufficient documentation

## 2013-07-09 DIAGNOSIS — C8589 Other specified types of non-Hodgkin lymphoma, extranodal and solid organ sites: Secondary | ICD-10-CM

## 2013-07-09 DIAGNOSIS — R5383 Other fatigue: Secondary | ICD-10-CM

## 2013-07-09 DIAGNOSIS — C8596 Non-Hodgkin lymphoma, unspecified, intrapelvic lymph nodes: Secondary | ICD-10-CM | POA: Insufficient documentation

## 2013-07-09 DIAGNOSIS — Z79899 Other long term (current) drug therapy: Secondary | ICD-10-CM | POA: Insufficient documentation

## 2013-07-09 DIAGNOSIS — H3530 Unspecified macular degeneration: Secondary | ICD-10-CM | POA: Insufficient documentation

## 2013-07-09 DIAGNOSIS — H409 Unspecified glaucoma: Secondary | ICD-10-CM | POA: Insufficient documentation

## 2013-07-09 DIAGNOSIS — R634 Abnormal weight loss: Secondary | ICD-10-CM | POA: Insufficient documentation

## 2013-07-09 DIAGNOSIS — H9190 Unspecified hearing loss, unspecified ear: Secondary | ICD-10-CM | POA: Insufficient documentation

## 2013-07-09 DIAGNOSIS — Z96659 Presence of unspecified artificial knee joint: Secondary | ICD-10-CM | POA: Insufficient documentation

## 2013-07-09 DIAGNOSIS — E785 Hyperlipidemia, unspecified: Secondary | ICD-10-CM | POA: Insufficient documentation

## 2013-07-09 DIAGNOSIS — I10 Essential (primary) hypertension: Secondary | ICD-10-CM | POA: Insufficient documentation

## 2013-07-09 DIAGNOSIS — R599 Enlarged lymph nodes, unspecified: Secondary | ICD-10-CM | POA: Insufficient documentation

## 2013-07-09 DIAGNOSIS — F1020 Alcohol dependence, uncomplicated: Secondary | ICD-10-CM | POA: Insufficient documentation

## 2013-07-09 DIAGNOSIS — K7030 Alcoholic cirrhosis of liver without ascites: Secondary | ICD-10-CM | POA: Insufficient documentation

## 2013-07-09 DIAGNOSIS — K219 Gastro-esophageal reflux disease without esophagitis: Secondary | ICD-10-CM | POA: Insufficient documentation

## 2013-07-09 DIAGNOSIS — R5381 Other malaise: Secondary | ICD-10-CM | POA: Insufficient documentation

## 2013-07-09 LAB — CBC
HEMATOCRIT: 28.3 % — AB (ref 39.0–52.0)
HEMOGLOBIN: 9.3 g/dL — AB (ref 13.0–17.0)
MCH: 27.8 pg (ref 26.0–34.0)
MCHC: 32.9 g/dL (ref 30.0–36.0)
MCV: 84.5 fL (ref 78.0–100.0)
Platelets: 140 10*3/uL — ABNORMAL LOW (ref 150–400)
RBC: 3.35 MIL/uL — AB (ref 4.22–5.81)
RDW: 16.5 % — ABNORMAL HIGH (ref 11.5–15.5)
WBC: 7.8 10*3/uL (ref 4.0–10.5)

## 2013-07-09 LAB — PROTIME-INR
INR: 0.94 (ref 0.00–1.49)
Prothrombin Time: 12.4 s (ref 11.6–15.2)

## 2013-07-09 LAB — APTT: aPTT: 30 seconds (ref 24–37)

## 2013-07-09 MED ORDER — HEPARIN SOD (PORK) LOCK FLUSH 100 UNIT/ML IV SOLN
500.0000 [IU] | INTRAVENOUS | Status: AC | PRN
  Administered 2013-07-09: 500 [IU]
  Filled 2013-07-09: qty 5

## 2013-07-09 MED ORDER — FENTANYL CITRATE 0.05 MG/ML IJ SOLN
INTRAMUSCULAR | Status: AC | PRN
  Administered 2013-07-09 (×2): 25 ug via INTRAVENOUS

## 2013-07-09 MED ORDER — MIDAZOLAM HCL 2 MG/2ML IJ SOLN
INTRAMUSCULAR | Status: AC
  Filled 2013-07-09: qty 6

## 2013-07-09 MED ORDER — SODIUM CHLORIDE 0.9 % IV SOLN
INTRAVENOUS | Status: DC
  Administered 2013-07-09: 20 mL/h via INTRAVENOUS

## 2013-07-09 MED ORDER — MIDAZOLAM HCL 2 MG/2ML IJ SOLN
INTRAMUSCULAR | Status: AC | PRN
  Administered 2013-07-09 (×2): 1 mg via INTRAVENOUS

## 2013-07-09 MED ORDER — FENTANYL CITRATE 0.05 MG/ML IJ SOLN
INTRAMUSCULAR | Status: AC
  Filled 2013-07-09: qty 6

## 2013-07-09 NOTE — H&P (Signed)
Jack [name] is an 80 y.o. male.   Chief Complaint: "I'm having a biopsy" HPI: Patient with history of grade 3 stage II follicular lymphoma, initially diagnosed 2007, now with evidence of relapse /advancement of adenopathy presents today for CT guided biopsy of a left RP nodal conglomerate.   Past Medical History  Diagnosis Date  . GERD (gastroesophageal reflux disease)   . Osteoarthritis   . Alcohol abuse     in recovery 2006  . 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 wine only  . Irritable bowel   . Hyperlipidemia   . Insomnia   . PAC (premature atrial contraction)     stable (ruled out for a fib)  . Prostatitis     chronic bacterial  . Pseudogout   . Sinus problem     chronic  . Cellulitis     2nd to T. Pedis(not at present)  . Hearing loss 03-10-12    bilateral hearing aids  . Complication of anesthesia 03-10-12    after shoulder  surgery -difficulty awakening,breathing problems  . Emphysema 03-10-12    tx. inhalers, uses steam room frequently  . Cramping of feet 03-10-12    cramping of both legs and hands occ.  . Non Hodgkin's lymphoma 03-10-12     '07-1 yr. in remission([facility])-not seeing now  . Osteoarthritis of left knee 03/17/2012  . Glaucoma   . Macular degeneration   . Hypertension   . Shortness of breath     Past Surgical History  Procedure Laterality Date  . Esophagogastroduodenoscopy  01-27-02  . Inguinal hernia repair  1979  . Torn biceps repair  03-10-12    Left rotator cuff repair  . Hernia repair    . Cataract surgery  03-10-12    03-04-12(right)/ (03-11-12-left)  . Total knee arthroplasty  03/17/2012    Procedure: TOTAL KNEE ARTHROPLASTY;  Surgeon: Johnny Bridge, MD;  Location: [facility];  Service: Orthopedics;  Laterality: Left;  Marland Kitchen Eye surgery  2014    cataract extraction with IOL both eyes staged  . Colonoscopy    . Rotator cuff repair Left   . Total knee arthroplasty Right 03/22/2013    Procedure: TOTAL KNEE  ARTHROPLASTY;  Surgeon: Johnny Bridge, MD;  Location: [facility];  Service: Orthopedics;  Laterality: Right;  righ total knee arthroplasty    Family History  Problem Relation Age of Onset  . Coronary artery disease Father   . Heart attack Father   . Cancer Brother     colon  . Other Other     TB   Social History:  reports that he quit smoking about 35 years ago. His smoking use included Cigarettes. He smoked 0.00 packs per day. He has never used smokeless tobacco. He reports that he drinks about 6.7 ounces of alcohol per week. He reports that he does not use illicit drugs.  Allergies:  Allergies  Allergen Reactions  . Amoxicillin     REACTION: rash  . Latex Rash  . Penicillins Rash    Current outpatient prescriptions:albuterol (PROVENTIL HFA;VENTOLIN HFA) 108 (90 BASE) MCG/ACT inhaler, Inhale 2 puffs into the lungs every 6 (six) hours as needed for shortness of breath. , Disp: , Rfl: ;  alfuzosin (UROXATRAL) 10 MG 24 hr tablet, Take 10 mg by mouth every morning. , Disp: , Rfl: ;  allopurinol (ZYLOPRIM) 100 MG tablet, Take 100 mg by mouth daily., Disp: , Rfl:  aspirin EC 81 MG tablet,  Take 81 mg by mouth every evening. , Disp: , Rfl: ;  esomeprazole (NEXIUM) 40 MG capsule, Take 40 mg by mouth daily before breakfast., Disp: , Rfl: ;  famciclovir (FAMVIR) 500 MG tablet, Take 250 mg by mouth daily., Disp: , Rfl: ;  Fluticasone-Salmeterol (ADVAIR) 500-50 MCG/DOSE AEPB, Inhale 1 puff into the lungs 2 (two) times daily., Disp: , Rfl:  hyoscyamine (LEVSIN SL) 0.125 MG SL tablet, dissolve 1 tablet under the tongue every 2 hours if needed for ABDOMINAL CRAMPS, Disp: 48 tablet, Rfl: 1;  lidocaine-prilocaine (EMLA) cream, Apply 1 application topically as needed. Apply to port before chemotherapy., Disp: , Rfl: ;  magnesium oxide (MAG-OX) 400 MG tablet, Take 400 mg by mouth daily., Disp: , Rfl:  Menthol, Topical Analgesic, 10 % LIQD, Apply 1 application topically 3 (three) times daily as needed (pain).,  Disp: , Rfl: ;  metoprolol succinate (TOPROL-XL) 25 MG 24 hr tablet, take 3 tablets by mouth every morning with food, Disp: 90 tablet, Rfl: 5;  metoprolol succinate (TOPROL-XL) 50 MG 24 hr tablet, Take 75 mg by mouth every morning. Take with or immediately following a meal., Disp: , Rfl:  Multiple Vitamins-Minerals (PRESERVISION AREDS PO), Take 1 tablet by mouth 2 (two) times daily., Disp: , Rfl: ;  potassium chloride SA (K-DUR,KLOR-CON) 20 MEQ tablet, take 1 tablet by mouth once daily, Disp: 30 tablet, Rfl: 5;  psyllium (METAMUCIL) 58.6 % packet, Take 1 packet by mouth daily., Disp: , Rfl: ;  quiNINE (QUALAQUIN) 324 MG capsule, Take 324 mg by mouth 2 (two) times daily., Disp: , Rfl:  senna-docusate (SENOKOT-S) 8.6-50 MG per tablet, Take 1 tablet by mouth 2 (two) times daily., Disp: 60 tablet, Rfl: 1;  sennosides-docusate sodium (SENOKOT-S) 8.6-50 MG tablet, Take 2 tablets by mouth daily as needed for constipation., Disp: , Rfl: ;  simvastatin (ZOCOR) 20 MG tablet, Take 20 mg by mouth every evening., Disp: , Rfl:  temazepam (RESTORIL) 30 MG capsule, Take 1 capsule (30 mg total) by mouth at bedtime as needed for sleep., Disp: 30 capsule, Rfl: 5;  traZODone (DESYREL) 50 MG tablet, Take 1 tablet (50 mg total) by mouth at bedtime as needed for sleep., Disp: 30 tablet, Rfl: 3 Besifloxacin HCl (BESIVANCE) 0.6 % SUSP, Place 1 drop into the right eye See admin instructions. Uses for 2 days after each monthly ocular injection.  Next injection due 03/12/2013., Disp: , Rfl: ;  oxyCODONE-acetaminophen (PERCOCET) 10-325 MG per tablet, Take 1-2 tablets by mouth every 6 (six) hours as needed for pain. MAXIMUM TOTAL ACETAMINOPHEN DOSE IS 4000 MG PER DAY, Disp: 30 tablet, Rfl: 0 prochlorperazine (COMPAZINE) 10 MG tablet, Take 1 tablet (10 mg total) by mouth every 6 (six) hours as needed for nausea or vomiting., Disp: 30 tablet, Rfl: 2;  sildenafil (VIAGRA) 100 MG tablet, Take 100 mg by mouth daily as needed for erectile  dysfunction., Disp: , Rfl: ;  triamcinolone cream (KENALOG) 0.1 %, Apply 1 application topically 2 (two) times daily as needed (rash). , Disp: , Rfl:  Current facility-administered medications:0.9 %  sodium chloride infusion, , Intravenous, Continuous, Ascencion Dike, PA-C   Results for orders placed during the hospital encounter of 07/09/13  CBC      Result Value Range   WBC 7.8  4.0 - 10.5 K/uL   RBC 3.35 (*) 4.22 - 5.81 MIL/uL   Hemoglobin 9.3 (*) 13.0 - 17.0 g/dL   HCT 28.3 (*) 39.0 - 52.0 %   MCV 84.5  78.0 - 100.0  fL   MCH 27.8  26.0 - 34.0 pg   MCHC 32.9  30.0 - 36.0 g/dL   RDW 16.5 (*) 11.5 - 15.5 %   Platelets 140 (*) 150 - 400 K/uL    Review of Systems  Constitutional: Positive for weight loss and malaise/fatigue. Negative for fever and chills.  Respiratory: Positive for shortness of breath. Negative for cough.   Cardiovascular: Positive for leg swelling. Negative for chest pain.  Gastrointestinal: Negative for nausea and vomiting.       Occ abd pain  Musculoskeletal: Negative for back pain.  Neurological: Negative for headaches.    Blood pressure 132/67, pulse 78, temperature 97.8 F (36.6 C), temperature source Oral, resp. rate 20, height 5\' 11"  (1.803 m), weight 180 lb (81.647 kg), SpO2 99.00%. Physical Exam  Constitutional: He is oriented to person, place, and time. He appears well-developed and well-nourished.  Cardiovascular: Normal rate.   occ premature beats notes  Respiratory: Effort normal and breath sounds normal.  Clean, intact rt upper chest wall PAC  GI: Soft. Bowel sounds are normal.  Mild tenderness to palpation  Musculoskeletal: Normal range of motion.  2-3+ LLE edema  Neurological: He is alert and oriented to person, place, and time.     Assessment/Plan Patient with history of grade 3 stage II follicular lymphoma, initially diagnosed 2007, now with evidence of relapse /advancement of adenopathy presents today for CT guided biopsy of a left RP  nodal conglomerate.  Details/risks of procedure d/w pt/family with their understanding and consent.   Nyala Kirchner,D KEVIN 07/09/2013, 9:34 AM

## 2013-07-09 NOTE — Procedures (Signed)
L iliac LN Bx 18 g core times 4 No comp

## 2013-07-09 NOTE — Discharge Instructions (Signed)
Needle Biopsy °Care After °These instructions give you information on caring for yourself after your procedure. Your doctor may also give you more specific instructions. Call your doctor if you have any problems or questions after your procedure. °HOME CARE °· Rest for 4 hours after your biopsy, except for getting up to go to the bathroom or as told. °· Keep the places where the needles were put in clean and dry. °· Do not put powder or lotion on the sites. °· Do not shower until 24 hours after the test. Remove all bandages (dressings) before showering. °· Remove all bandages at least once every day. Gently clean the sites with soap and water. Keep putting a new bandage on until the skin is closed. °Finding out the results of your test °Ask your doctor when your test results will be ready. Make sure you follow up and get the test results. °GET HELP RIGHT AWAY IF:  °· You have shortness of breath or trouble breathing. °· You have pain or cramping in your belly (abdomen). °· You feel sick to your stomach (nauseous) or throw up (vomit). °· Any of the places where the needles were put in: °· Are puffy (swollen) or red. °· Are sore or hot to the touch. °· Are draining yellowish-white fluid (pus). °· Are bleeding after 10 minutes of pressing down on the site. Have someone keep pressing on any place that is bleeding until you see a doctor. °· You have any unusual pain that will not stop. °· You have a fever. °If you go to the emergency room, tell the nurse that you had a biopsy. Take this paper with you to show the nurse. °MAKE SURE YOU:  °· Understand these instructions. °· Will watch your condition. °· Will get help right away if you are not doing well or get worse. °Document Released: 05/23/2008 Document Revised: 09/02/2011 Document Reviewed: 05/23/2008 °[employer]® Patient Information ©2014 [employer]. °Moderate Sedation, Adult °Moderate sedation is given to help you relax or even sleep through a procedure. You may  remain sleepy, be clumsy, or have poor balance for several hours following this procedure. Arrange for a responsible adult, family member, or friend to take you home. A responsible adult should stay with you for at least 24 hours or until the medicines have worn off. °· Do not participate in any activities where you could become injured for the next 24 hours, or until you feel normal again. Do not: °· Drive. °· Swim. °· Ride a bicycle. °· Operate heavy machinery. °· Cook. °· Use power tools. °· Climb ladders. °· Work at heights. °· Do not make important decisions or sign legal documents until you are improved. °· Vomiting may occur if you eat too soon. When you can drink without vomiting, try water, juice, or soup. Try solid foods if you feel little or no nausea. °· Only take over-the-counter or prescription medications for pain, discomfort, or fever as directed by your caregiver.If pain medications have been prescribed for you, ask your caregiver how soon it is safe to take them. °· Make sure you and your family fully understands everything about the medication given to you. Make sure you understand what side effects may occur. °· You should not drink alcohol, take sleeping pills, or medications that cause drowsiness for at least 24 hours. °· If you smoke, do not smoke alone. °· If you are feeling better, you may resume normal activities 24 hours after receiving sedation. °· Keep all appointments as scheduled.   Follow all instructions. °· Ask questions if you do not understand. °SEEK MEDICAL CARE IF:  °· Your skin is pale or bluish in color. °· You continue to feel sick to your stomach (nauseous) or throw up (vomit). °· Your pain is getting worse and not helped by medication. °· You have bleeding or swelling. °· You are still sleepy or feeling clumsy after 24 hours. °SEEK IMMEDIATE MEDICAL CARE IF:  °· You develop a rash. °· You have difficulty breathing. °· You develop any type of allergic problem. °· You have a  fever. °Document Released: 03/05/2001 Document Revised: 09/02/2011 Document Reviewed: 02/15/2013 °[employer]® Patient Information ©2014 [employer]. ° °

## 2013-07-12 ENCOUNTER — Other Ambulatory Visit: Payer: Self-pay

## 2013-07-12 MED ORDER — ALLOPURINOL 100 MG PO TABS: 100.0000 mg | ORAL_TABLET | Freq: Every day | ORAL | Status: AC

## 2013-07-14 ENCOUNTER — Other Ambulatory Visit: Payer: Self-pay | Admitting: Hematology and Oncology

## 2013-07-14 ENCOUNTER — Telehealth: Payer: Self-pay | Admitting: Medical Oncology

## 2013-07-14 DIAGNOSIS — C8589 Other specified types of non-Hodgkin lymphoma, extranodal and solid organ sites: Secondary | ICD-10-CM

## 2013-07-14 MED ORDER — OXYCODONE-ACETAMINOPHEN 10-325 MG PO TABS: 1.0000 | ORAL_TABLET | Freq: Four times a day (QID) | ORAL | Status: DC | PRN

## 2013-07-14 NOTE — Telephone Encounter (Signed)
Patient called for refill of oxycodone, prescription refilled by Dr. Alvy Bimler d/t Dr. Alen Blew out of office.

## 2013-07-22 ENCOUNTER — Ambulatory Visit (HOSPITAL_BASED_OUTPATIENT_CLINIC_OR_DEPARTMENT_OTHER): Payer: Medicare Other | Admitting: Oncology

## 2013-07-22 ENCOUNTER — Encounter: Payer: Self-pay | Admitting: Oncology

## 2013-07-22 ENCOUNTER — Telehealth: Payer: Self-pay | Admitting: Oncology

## 2013-07-22 ENCOUNTER — Other Ambulatory Visit (HOSPITAL_BASED_OUTPATIENT_CLINIC_OR_DEPARTMENT_OTHER): Payer: Medicare Other

## 2013-07-22 VITALS — BP 112/54 | HR 112 | Temp 98.0°F | Resp 18 | Ht 71.0 in | Wt 183.8 lb

## 2013-07-22 DIAGNOSIS — I890 Lymphedema, not elsewhere classified: Secondary | ICD-10-CM

## 2013-07-22 DIAGNOSIS — C8299 Follicular lymphoma, unspecified, extranodal and solid organ sites: Secondary | ICD-10-CM

## 2013-07-22 DIAGNOSIS — C8589 Other specified types of non-Hodgkin lymphoma, extranodal and solid organ sites: Secondary | ICD-10-CM

## 2013-07-22 LAB — COMPREHENSIVE METABOLIC PANEL (CC13)
ALT: 11 U/L (ref 0–55)
AST: 42 U/L — AB (ref 5–34)
Albumin: 3.1 g/dL — ABNORMAL LOW (ref 3.5–5.0)
Alkaline Phosphatase: 71 U/L (ref 40–150)
Anion Gap: 11 mEq/L (ref 3–11)
BUN: 11.2 mg/dL (ref 7.0–26.0)
CO2: 25 mEq/L (ref 22–29)
CREATININE: 0.7 mg/dL (ref 0.7–1.3)
Calcium: 9.8 mg/dL (ref 8.4–10.4)
Chloride: 97 mEq/L — ABNORMAL LOW (ref 98–109)
GLUCOSE: 114 mg/dL (ref 70–140)
POTASSIUM: 4.4 meq/L (ref 3.5–5.1)
Sodium: 132 mEq/L — ABNORMAL LOW (ref 136–145)
Total Bilirubin: 0.34 mg/dL (ref 0.20–1.20)
Total Protein: 5.9 g/dL — ABNORMAL LOW (ref 6.4–8.3)

## 2013-07-22 LAB — CBC WITH DIFFERENTIAL/PLATELET
BASO%: 0.9 % (ref 0.0–2.0)
BASOS ABS: 0.1 10*3/uL (ref 0.0–0.1)
EOS ABS: 0.2 10*3/uL (ref 0.0–0.5)
EOS%: 2.5 % (ref 0.0–7.0)
HCT: 27 % — ABNORMAL LOW (ref 38.4–49.9)
HEMOGLOBIN: 8.9 g/dL — AB (ref 13.0–17.1)
LYMPH%: 29.4 % (ref 14.0–49.0)
MCH: 27.5 pg (ref 27.2–33.4)
MCHC: 33 g/dL (ref 32.0–36.0)
MCV: 83.3 fL (ref 79.3–98.0)
MONO#: 1.5 10*3/uL — ABNORMAL HIGH (ref 0.1–0.9)
MONO%: 21.2 % — AB (ref 0.0–14.0)
NEUT%: 46 % (ref 39.0–75.0)
NEUTROS ABS: 3.3 10*3/uL (ref 1.5–6.5)
Platelets: 142 10*3/uL (ref 140–400)
RBC: 3.24 10*6/uL — ABNORMAL LOW (ref 4.20–5.82)
RDW: 17.2 % — ABNORMAL HIGH (ref 11.0–14.6)
WBC: 7.1 10*3/uL (ref 4.0–10.3)
lymph#: 2.1 10*3/uL (ref 0.9–3.3)

## 2013-07-22 MED ORDER — OXYCODONE-ACETAMINOPHEN 10-325 MG PO TABS: 1.0000 | ORAL_TABLET | Freq: Four times a day (QID) | ORAL | Status: DC | PRN

## 2013-07-22 NOTE — Telephone Encounter (Signed)
gv adn printed aptp sched and avs forpt for Feb....sed added tx.  °

## 2013-07-22 NOTE — Progress Notes (Signed)
Hematology and Oncology Follow Up Visit  Jack [name] [id number] Feb 07, 1929 80 y.o. 07/22/2013 10:28 AM   Principle Diagnosis: This is a 80 year old gentleman with grade 3 stage II follicular lymphoma diagnosed in 2007. He was in remission until November of 2014 with a presented with a relapse pelvic disease and lower remedy edema  Prior Therapy: Patient treated with 6 cycles of CHOP with rituximab.  Therapy concluded August 2007.  He had a complete response and had been in remission since that time.  Current therapy: Salvage chemotherapy status post the first cycle of bendamustine and rituximab. He is status post 3 cycles of this regimen  Interim History: Jack [name] presents today for a followup visit.  Since his last visit, he has been doing about the same with continuous left lower extremity swelling. He still ambulatory with the help of a walker and a cane. Patient tolerated the third cycle of chemotherapy of rituximab and bendamustine that was overall well tolerated. He did not develop infusion-related complications from rituximab. He still have episodic abdominal discomfort and occasional constipation. But otherwise has not had any recent hospitalization or illnesses. He reports that about 2-3 weeks after each cycle of chemotherapy feels a lot better and the swelling decreases dramatically but the week leading up to the next cycle of chemotherapy the swelling returns as well as abdominal fullness. He uses oxycodone for pain control which helps with his symptoms. He continues to lose weight and overall have been doing poorly.   Medications: Current Outpatient Prescriptions  Medication Sig Dispense Refill  . albuterol (PROVENTIL HFA;VENTOLIN HFA) 108 (90 BASE) MCG/ACT inhaler Inhale 2 puffs into the lungs every 6 (six) hours as needed for shortness of breath.       . alfuzosin (UROXATRAL) 10 MG 24 hr tablet Take 10 mg by mouth every morning.       Marland Kitchen allopurinol (ZYLOPRIM) 100 MG tablet Take 1  tablet (100 mg total) by mouth daily.  30 tablet  5  . aspirin EC 81 MG tablet Take 81 mg by mouth every evening.       Marland Kitchen Besifloxacin HCl (BESIVANCE) 0.6 % SUSP Place 1 drop into the right eye See admin instructions. Uses for 2 days after each monthly ocular injection.  Next injection due 03/12/2013.      Marland Kitchen esomeprazole (NEXIUM) 40 MG capsule Take 40 mg by mouth daily before breakfast.      . famciclovir (FAMVIR) 500 MG tablet Take 250 mg by mouth daily.      . Fluticasone-Salmeterol (ADVAIR) 500-50 MCG/DOSE AEPB Inhale 1 puff into the lungs 2 (two) times daily.      . hyoscyamine (LEVSIN SL) 0.125 MG SL tablet dissolve 1 tablet under the tongue every 2 hours if needed for ABDOMINAL CRAMPS  48 tablet  1  . lidocaine-prilocaine (EMLA) cream Apply 1 application topically as needed. Apply to port before chemotherapy.      . magnesium oxide (MAG-OX) 400 MG tablet Take 400 mg by mouth daily.      . Menthol, Topical Analgesic, 10 % LIQD Apply 1 application topically 3 (three) times daily as needed (pain).      . metoprolol succinate (TOPROL-XL) 25 MG 24 hr tablet take 3 tablets by mouth every morning with food  90 tablet  5  . metoprolol succinate (TOPROL-XL) 50 MG 24 hr tablet Take 75 mg by mouth every morning. Take with or immediately following a meal.      . Multiple Vitamins-Minerals (PRESERVISION AREDS  PO) Take 1 tablet by mouth 2 (two) times daily.      Marland Kitchen oxyCODONE-acetaminophen (PERCOCET) 10-325 MG per tablet Take 1 tablet by mouth every 6 (six) hours as needed for pain. MAXIMUM TOTAL ACETAMINOPHEN DOSE IS 4000 MG PER DAY  60 tablet  0  . potassium chloride SA (K-DUR,KLOR-CON) 20 MEQ tablet take 1 tablet by mouth once daily  30 tablet  5  . prochlorperazine (COMPAZINE) 10 MG tablet Take 1 tablet (10 mg total) by mouth every 6 (six) hours as needed for nausea or vomiting.  30 tablet  2  . psyllium (METAMUCIL) 58.6 % packet Take 1 packet by mouth daily.      . quiNINE (QUALAQUIN) 324 MG capsule Take  324 mg by mouth 2 (two) times daily.      Marland Kitchen senna-docusate (SENOKOT-S) 8.6-50 MG per tablet Take 1 tablet by mouth 2 (two) times daily.  60 tablet  1  . sennosides-docusate sodium (SENOKOT-S) 8.6-50 MG tablet Take 2 tablets by mouth daily as needed for constipation.      . sildenafil (VIAGRA) 100 MG tablet Take 100 mg by mouth daily as needed for erectile dysfunction.      . simvastatin (ZOCOR) 20 MG tablet Take 20 mg by mouth every evening.      . temazepam (RESTORIL) 30 MG capsule Take 1 capsule (30 mg total) by mouth at bedtime as needed for sleep.  30 capsule  5  . traZODone (DESYREL) 50 MG tablet Take 1 tablet (50 mg total) by mouth at bedtime as needed for sleep.  30 tablet  3  . triamcinolone cream (KENALOG) 0.1 % Apply 1 application topically 2 (two) times daily as needed (rash).        No current facility-administered medications for this visit.    Allergies:  Allergies  Allergen Reactions  . Amoxicillin     REACTION: rash  . Latex Rash  . Penicillins Rash    Past Medical History, Surgical history, Social history, and Family History were reviewed and updated.  Review of Systems:  Remaining ROS negative. Physical Exam: Blood pressure 112/54, pulse 112, temperature 98 F (36.7 C), temperature source Oral, resp. rate 18, height 5\' 11"  (1.803 m), weight 183 lb 12.8 oz (83.371 kg), SpO2 99.00%. ECOG: 1  General appearance: alert Head: Normocephalic, without obvious abnormality, atraumatic Neck: no adenopathy, no carotid bruit, no JVD, supple, symmetrical, trachea midline and thyroid not enlarged, symmetric, no tenderness/mass/nodules Lymph nodes: Cervical, supraclavicular, and axillary nodes normal. Heart:regular rate and rhythm, S1, S2 normal, no murmur, click, rub or gallop Lung:chest clear, no wheezing, rales, normal symmetric air entry Abdomin: soft, non-tender, without masses or organomegaly EXT: 3+ edema on the left more than the right.   Lab Results: Lab Results   Component Value Date   WBC 7.1 07/22/2013   HGB 8.9* 07/22/2013   HCT 27.0* 07/22/2013   MCV 83.3 07/22/2013   PLT 142 07/22/2013     Chemistry      Component Value Date/Time   NA 130* 06/29/2013 1313   NA 132* 05/06/2013 0545   K 4.9 06/29/2013 1313   K 4.3 05/06/2013 0545   CL 98 05/06/2013 0545   CL 104 04/22/2012 1310   CO2 29 06/29/2013 1313   CO2 25 05/06/2013 0545   BUN 17.8 06/29/2013 1313   BUN 8 05/06/2013 0545   CREATININE 0.9 06/29/2013 1313   CREATININE 0.78 05/06/2013 0545      Component Value Date/Time   CALCIUM 9.7  06/29/2013 1313   CALCIUM 9.5 05/06/2013 0545   ALKPHOS 82 06/29/2013 1313   ALKPHOS 64 05/04/2013 1200   AST 38* 06/29/2013 1313   AST 30 05/04/2013 1200   ALT 11 06/29/2013 1313   ALT 14 05/04/2013 1200   BILITOT 0.51 06/29/2013 1313   BILITOT 0.3 05/04/2013 1200     Impression and Plan:  A pleasant 80 year old gentleman with the following issues:   1. Grade 3 stage II follicular lymphoma, status post CHOP with rituximab.  Now he has developed relapsed disease and tolerated the first 3 cycle of salvage chemotherapy with bendamustine and rituximab without any complications. CT scan results from 06/29/2013 were discussed with the patient and his family again. 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 This is probably represents transformation of his lymphoma and carries a poor prognosis. Options of treatments were discussed today in detail including referral to a specialized Center. In terms of salvage chemotherapy regimen I feel the best way to approach this is to use the ICE- R combination. Complication of this regimen were discussed today in detail including nausea, vomiting, myelosuppression as well as need for growth factor support were discussed. He is agreeable to proceed he understands that this is a risky regimen and has a high complication rate. He also understands that his prognosis with an  aggressive regimen is very poor. We anticipate starting chemotherapy next week and the second cycle will be in 2 weeks after that. 2. IV access: A Port-A-Cath has been inserted without any complications. 3. Lower extremity swelling. This is related to lymphatic obstruction.  4. Followup: Will be in 3 weeks' time and potentially for the next cycle of chemotherapy.  [facility], MD 1/29/201510:28 AM

## 2013-07-22 NOTE — Telephone Encounter (Signed)
gv and printed appt sched and avs for pt for Feb...sed added tx.   °

## 2013-07-25 ENCOUNTER — Encounter: Payer: Self-pay | Admitting: Internal Medicine

## 2013-07-26 ENCOUNTER — Encounter (INDEPENDENT_AMBULATORY_CARE_PROVIDER_SITE_OTHER): Payer: Medicare Other | Admitting: Ophthalmology

## 2013-07-27 ENCOUNTER — Ambulatory Visit (HOSPITAL_BASED_OUTPATIENT_CLINIC_OR_DEPARTMENT_OTHER): Payer: Medicare Other

## 2013-07-27 ENCOUNTER — Ambulatory Visit (HOSPITAL_BASED_OUTPATIENT_CLINIC_OR_DEPARTMENT_OTHER): Payer: Medicare Other | Admitting: Oncology

## 2013-07-27 ENCOUNTER — Other Ambulatory Visit: Payer: Self-pay | Admitting: Oncology

## 2013-07-27 VITALS — BP 125/71 | HR 94 | Temp 98.2°F | Resp 20

## 2013-07-27 DIAGNOSIS — M25559 Pain in unspecified hip: Secondary | ICD-10-CM

## 2013-07-27 DIAGNOSIS — Z5112 Encounter for antineoplastic immunotherapy: Secondary | ICD-10-CM

## 2013-07-27 DIAGNOSIS — C8589 Other specified types of non-Hodgkin lymphoma, extranodal and solid organ sites: Secondary | ICD-10-CM

## 2013-07-27 DIAGNOSIS — C8590 Non-Hodgkin lymphoma, unspecified, unspecified site: Secondary | ICD-10-CM

## 2013-07-27 DIAGNOSIS — C8299 Follicular lymphoma, unspecified, extranodal and solid organ sites: Secondary | ICD-10-CM

## 2013-07-27 DIAGNOSIS — Z5111 Encounter for antineoplastic chemotherapy: Secondary | ICD-10-CM

## 2013-07-27 LAB — CBC WITH DIFFERENTIAL/PLATELET
BASO%: 0.6 % (ref 0.0–2.0)
BASOS ABS: 0 10*3/uL (ref 0.0–0.1)
EOS ABS: 0.2 10*3/uL (ref 0.0–0.5)
EOS%: 3.8 % (ref 0.0–7.0)
HCT: 25.9 % — ABNORMAL LOW (ref 38.4–49.9)
HEMOGLOBIN: 8.6 g/dL — AB (ref 13.0–17.1)
LYMPH#: 1.2 10*3/uL (ref 0.9–3.3)
LYMPH%: 19.8 % (ref 14.0–49.0)
MCH: 27.5 pg (ref 27.2–33.4)
MCHC: 33.2 g/dL (ref 32.0–36.0)
MCV: 83 fL (ref 79.3–98.0)
MONO#: 1 10*3/uL — ABNORMAL HIGH (ref 0.1–0.9)
MONO%: 16.7 % — AB (ref 0.0–14.0)
NEUT#: 3.5 10*3/uL (ref 1.5–6.5)
NEUT%: 59.1 % (ref 39.0–75.0)
Platelets: 147 10*3/uL (ref 140–400)
RBC: 3.12 10*6/uL — ABNORMAL LOW (ref 4.20–5.82)
RDW: 16.9 % — ABNORMAL HIGH (ref 11.0–14.6)
WBC: 6 10*3/uL (ref 4.0–10.3)

## 2013-07-27 LAB — COMPREHENSIVE METABOLIC PANEL (CC13)
ALBUMIN: 2.8 g/dL — AB (ref 3.5–5.0)
ALK PHOS: 73 U/L (ref 40–150)
ALT: 12 U/L (ref 0–55)
ANION GAP: 10 meq/L (ref 3–11)
AST: 41 U/L — ABNORMAL HIGH (ref 5–34)
BUN: 11.8 mg/dL (ref 7.0–26.0)
CHLORIDE: 98 meq/L (ref 98–109)
CO2: 24 meq/L (ref 22–29)
Calcium: 9.6 mg/dL (ref 8.4–10.4)
Creatinine: 0.6 mg/dL — ABNORMAL LOW (ref 0.7–1.3)
GLUCOSE: 131 mg/dL (ref 70–140)
POTASSIUM: 4.4 meq/L (ref 3.5–5.1)
SODIUM: 132 meq/L — AB (ref 136–145)
TOTAL PROTEIN: 5.7 g/dL — AB (ref 6.4–8.3)
Total Bilirubin: 0.26 mg/dL (ref 0.20–1.20)

## 2013-07-27 MED ORDER — OXYCODONE HCL 5 MG PO TABS: ORAL_TABLET | ORAL | Status: AC

## 2013-07-27 MED ORDER — ACETAMINOPHEN 325 MG PO TABS
ORAL_TABLET | ORAL | Status: AC
  Filled 2013-07-27: qty 2

## 2013-07-27 MED ORDER — SODIUM CHLORIDE 0.9 % IJ SOLN
10.0000 mL | INTRAMUSCULAR | Status: DC | PRN
  Administered 2013-07-27: 10 mL
  Filled 2013-07-27: qty 10

## 2013-07-27 MED ORDER — DIPHENHYDRAMINE HCL 25 MG PO CAPS
ORAL_CAPSULE | ORAL | Status: AC
  Filled 2013-07-27: qty 2

## 2013-07-27 MED ORDER — DIPHENHYDRAMINE HCL 25 MG PO CAPS
50.0000 mg | ORAL_CAPSULE | Freq: Once | ORAL | Status: AC
  Administered 2013-07-27: 50 mg via ORAL

## 2013-07-27 MED ORDER — OXYCODONE HCL 5 MG PO TABS
5.0000 mg | ORAL_TABLET | Freq: Once | ORAL | Status: AC
  Administered 2013-07-27: 5 mg via ORAL
  Filled 2013-07-27: qty 1

## 2013-07-27 MED ORDER — ONDANSETRON 8 MG/NS 50 ML IVPB
INTRAVENOUS | Status: AC
  Filled 2013-07-27: qty 8

## 2013-07-27 MED ORDER — SODIUM CHLORIDE 0.9 % IV SOLN
100.0000 mg/m2 | Freq: Once | INTRAVENOUS | Status: AC
  Administered 2013-07-27: 200 mg via INTRAVENOUS
  Filled 2013-07-27: qty 10

## 2013-07-27 MED ORDER — SODIUM CHLORIDE 0.9 % IV SOLN
Freq: Once | INTRAVENOUS | Status: AC
  Administered 2013-07-27: 09:00:00 via INTRAVENOUS

## 2013-07-27 MED ORDER — SODIUM CHLORIDE 0.9 % IV SOLN
375.0000 mg/m2 | Freq: Once | INTRAVENOUS | Status: AC
  Administered 2013-07-27: 800 mg via INTRAVENOUS
  Filled 2013-07-27: qty 80

## 2013-07-27 MED ORDER — ACETAMINOPHEN 325 MG PO TABS
650.0000 mg | ORAL_TABLET | Freq: Once | ORAL | Status: AC
  Administered 2013-07-27: 650 mg via ORAL

## 2013-07-27 MED ORDER — HEPARIN SOD (PORK) LOCK FLUSH 100 UNIT/ML IV SOLN
500.0000 [IU] | Freq: Once | INTRAVENOUS | Status: AC | PRN
  Administered 2013-07-27: 500 [IU]
  Filled 2013-07-27: qty 5

## 2013-07-27 MED ORDER — DEXAMETHASONE SODIUM PHOSPHATE 10 MG/ML IJ SOLN
INTRAMUSCULAR | Status: AC
  Filled 2013-07-27: qty 1

## 2013-07-27 MED ORDER — OXYCODONE HCL ER 20 MG PO T12A: 20.0000 mg | EXTENDED_RELEASE_TABLET | Freq: Two times a day (BID) | ORAL | Status: AC

## 2013-07-27 MED ORDER — ONDANSETRON 8 MG/50ML IVPB (CHCC)
8.0000 mg | Freq: Once | INTRAVENOUS | Status: AC
  Administered 2013-07-27: 8 mg via INTRAVENOUS

## 2013-07-27 MED ORDER — DEXAMETHASONE SODIUM PHOSPHATE 10 MG/ML IJ SOLN
10.0000 mg | Freq: Once | INTRAMUSCULAR | Status: AC
  Administered 2013-07-27: 10 mg via INTRAVENOUS

## 2013-07-27 NOTE — Progress Notes (Signed)
1420 - Patient family reports patient is experiencing pain - wants him to have a pain pill.  They did not bring his pain medicine from home.  Patient reports pain level of 2-3, soreness in left leg.  Family does not want patient to take anything with acetophenamin in as they were given an information sheet stating not to take Tylenol as it may mask a fever.  This RN explained that the concern was masking of a fever and not interactions, patients family still concerned.  1425 - Per Dr. Alen Blew, verbal order for 5 mg Oxycodone only.  Pharmacy entered order - RN unable to order. Pharmacy ordered and must obtain from [facility].    1455 - Oxycodone administered.    Patients family requesting scrips for extended release non tylenol containing pain meds.  MD notified.   Scrips received from desk nurse and given to patients family.

## 2013-07-27 NOTE — Patient Instructions (Signed)
[facility] Discharge Instructions for Patients Receiving Chemotherapy  Today you received the following chemotherapy agents Rituxan and Etoposide.  To help prevent nausea and vomiting after your treatment, we encourage you to take your nausea medication.   If you develop nausea and vomiting that is not controlled by your nausea medication, call the clinic.   BELOW ARE SYMPTOMS THAT SHOULD BE REPORTED IMMEDIATELY:  *FEVER GREATER THAN 100.5 F  *CHILLS WITH OR WITHOUT FEVER  NAUSEA AND VOMITING THAT IS NOT CONTROLLED WITH YOUR NAUSEA MEDICATION  *UNUSUAL SHORTNESS OF BREATH  *UNUSUAL BRUISING OR BLEEDING  TENDERNESS IN MOUTH AND THROAT WITH OR WITHOUT PRESENCE OF ULCERS  *URINARY PROBLEMS  *BOWEL PROBLEMS  UNUSUAL RASH Items with * indicate a potential emergency and should be followed up as soon as possible.  Feel free to call the clinic you have any questions or concerns. The clinic phone number is [phone].  Etoposide, VP-16 injection What is this medicine? ETOPOSIDE, VP-16 (e toe POE side) is a chemotherapy drug. It is used to treat testicular cancer, lung cancer, and other cancers. This medicine may be used for other purposes; ask your health care provider or pharmacist if you have questions. COMMON BRAND NAME(S): Etopophos, Toposar, VePesid What should I tell my health care provider before I take this medicine? They need to know if you have any of these conditions: -infection -kidney disease -low blood counts, like low white cell, platelet, or red cell counts -an unusual or allergic reaction to etoposide, other chemotherapeutic agents, other medicines, foods, dyes, or preservatives -pregnant or trying to get pregnant -breast-feeding How should I use this medicine? This medicine is for infusion into a vein. It is administered in a hospital or clinic by a specially trained health care professional. Talk to your pediatrician regarding the use of  this medicine in children. Special care may be needed. Overdosage: If you think you have taken too much of this medicine contact a poison control center or emergency room at once. NOTE: This medicine is only for you. Do not share this medicine with others. What if I miss a dose? It is important not to miss your dose. Call your doctor or health care professional if you are unable to keep an appointment. What may interact with this medicine? -cyclosporine -medicines to increase blood counts like filgrastim, pegfilgrastim, sargramostim -vaccines This list may not describe all possible interactions. Give your health care provider a list of all the medicines, herbs, non-prescription drugs, or dietary supplements you use. Also tell them if you smoke, drink alcohol, or use illegal drugs. Some items may interact with your medicine. What should I watch for while using this medicine? Visit your doctor for checks on your progress. This drug may make you feel generally unwell. This is not uncommon, as chemotherapy can affect healthy cells as well as cancer cells. Report any side effects. Continue your course of treatment even though you feel ill unless your doctor tells you to stop. In some cases, you may be given additional medicines to help with side effects. Follow all directions for their use. Call your doctor or health care professional for advice if you get a fever, chills or sore throat, or other symptoms of a cold or flu. Do not treat yourself. This drug decreases your body's ability to fight infections. Try to avoid being around people who are sick. This medicine may increase your risk to bruise or bleed. Call your doctor or health care professional if you notice  any unusual bleeding. Be careful brushing and flossing your teeth or using a toothpick because you may get an infection or bleed more easily. If you have any dental work done, tell your dentist you are receiving this medicine. Avoid taking  products that contain aspirin, acetaminophen, ibuprofen, naproxen, or ketoprofen unless instructed by your doctor. These medicines may hide a fever. Do not become pregnant while taking this medicine. Women should inform their doctor if they wish to become pregnant or think they might be pregnant. There is a potential for serious side effects to an unborn child. Talk to your health care professional or pharmacist for more information. Do not breast-feed an infant while taking this medicine. What side effects may I notice from receiving this medicine? Side effects that you should report to your doctor or health care professional as soon as possible: -allergic reactions like skin rash, itching or hives, swelling of the face, lips, or tongue -low blood counts - this medicine may decrease the number of white blood cells, red blood cells and platelets. You may be at increased risk for infections and bleeding. -signs of infection - fever or chills, cough, sore throat, pain or difficulty passing urine -signs of decreased platelets or bleeding - bruising, pinpoint red spots on the skin, black, tarry stools, blood in the urine -signs of decreased red blood cells - unusually weak or tired, fainting spells, lightheadedness -breathing problems -changes in vision -mouth or throat sores or ulcers -pain, redness, swelling or irritation at the injection site -pain, tingling, numbness in the hands or feet -redness, blistering, peeling or loosening of the skin, including inside the mouth -seizures -vomiting Side effects that usually do not require medical attention (report to your doctor or health care professional if they continue or are bothersome): -diarrhea -hair loss -loss of appetite -nausea -stomach pain This list may not describe all possible side effects. Call your doctor for medical advice about side effects. You may report side effects to FDA at 1-800-FDA-1088. Where should I keep my medicine? This  drug is given in a hospital or clinic and will not be stored at home. NOTE: This sheet is a summary. It may not cover all possible information. If you have questions about this medicine, talk to your doctor, pharmacist, or health care provider.  2014, [employer]. (2007-10-12 17:24:12)

## 2013-07-28 ENCOUNTER — Encounter: Payer: Self-pay | Admitting: *Deleted

## 2013-07-28 ENCOUNTER — Other Ambulatory Visit: Payer: Self-pay | Admitting: Oncology

## 2013-07-28 ENCOUNTER — Ambulatory Visit (HOSPITAL_BASED_OUTPATIENT_CLINIC_OR_DEPARTMENT_OTHER): Payer: Medicare Other

## 2013-07-28 VITALS — BP 118/62 | HR 85 | Temp 96.9°F | Resp 18

## 2013-07-28 DIAGNOSIS — C8590 Non-Hodgkin lymphoma, unspecified, unspecified site: Secondary | ICD-10-CM

## 2013-07-28 DIAGNOSIS — C8299 Follicular lymphoma, unspecified, extranodal and solid organ sites: Secondary | ICD-10-CM

## 2013-07-28 DIAGNOSIS — Z5111 Encounter for antineoplastic chemotherapy: Secondary | ICD-10-CM

## 2013-07-28 MED ORDER — MESNA 100 MG/ML IV SOLN
Freq: Once | INTRAVENOUS | Status: DC
  Administered 2013-07-28: 13:00:00 via INTRAVENOUS
  Filled 2013-07-28: qty 204

## 2013-07-28 MED ORDER — ONDANSETRON 16 MG/50ML IVPB (CHCC)
16.0000 mg | Freq: Once | INTRAVENOUS | Status: AC
  Administered 2013-07-28: 16 mg via INTRAVENOUS

## 2013-07-28 MED ORDER — SODIUM CHLORIDE 0.9 % IV SOLN
100.0000 mg/m2 | Freq: Once | INTRAVENOUS | Status: AC
  Administered 2013-07-28: 200 mg via INTRAVENOUS
  Filled 2013-07-28: qty 10

## 2013-07-28 MED ORDER — SODIUM CHLORIDE 0.9 % IV SOLN
Freq: Once | INTRAVENOUS | Status: AC
  Administered 2013-07-28: 09:00:00 via INTRAVENOUS

## 2013-07-28 MED ORDER — ONDANSETRON 16 MG/50ML IVPB (CHCC)
INTRAVENOUS | Status: AC
  Filled 2013-07-28: qty 16

## 2013-07-28 MED ORDER — SODIUM CHLORIDE 0.9 % IV SOLN
461.0000 mg | Freq: Once | INTRAVENOUS | Status: AC
  Administered 2013-07-28: 460 mg via INTRAVENOUS
  Filled 2013-07-28: qty 46

## 2013-07-28 MED ORDER — DEXAMETHASONE SODIUM PHOSPHATE 20 MG/5ML IJ SOLN
20.0000 mg | Freq: Once | INTRAMUSCULAR | Status: AC
  Administered 2013-07-28: 20 mg via INTRAVENOUS

## 2013-07-28 MED ORDER — SODIUM CHLORIDE 0.9 % IV SOLN
400.0000 mg/m2 | Freq: Once | INTRAVENOUS | Status: AC
  Administered 2013-07-28: 800 mg via INTRAVENOUS
  Filled 2013-07-28: qty 8

## 2013-07-28 MED ORDER — DEXAMETHASONE SODIUM PHOSPHATE 20 MG/5ML IJ SOLN
INTRAMUSCULAR | Status: AC
  Filled 2013-07-28: qty 5

## 2013-07-28 NOTE — Patient Instructions (Addendum)
[facility] Discharge Instructions for Patients Receiving Chemotherapy  Today you received the following chemotherapy agents: etoposide, carboplatin, ifosfamide/mesna. You will be going home on a pump for 24 hours of ifosfamide and mesna.   To help prevent nausea and vomiting after your treatment, we encourage you to take your nausea medication as prescribed.    If you develop nausea and vomiting that is not controlled by your nausea medication, call the clinic.   BELOW ARE SYMPTOMS THAT SHOULD BE REPORTED IMMEDIATELY:  *FEVER GREATER THAN 100.5 F  *CHILLS WITH OR WITHOUT FEVER  NAUSEA AND VOMITING THAT IS NOT CONTROLLED WITH YOUR NAUSEA MEDICATION  *UNUSUAL SHORTNESS OF BREATH  *UNUSUAL BRUISING OR BLEEDING  TENDERNESS IN MOUTH AND THROAT WITH OR WITHOUT PRESENCE OF ULCERS  *URINARY PROBLEMS  *BOWEL PROBLEMS  UNUSUAL RASH Items with * indicate a potential emergency and should be followed up as soon as possible.  Feel free to call the clinic you have any questions or concerns. The clinic phone number is [phone].   Ifosfamide injection What is this medicine? IFOSFAMIDE (eye FOS fa mide) is a chemotherapy drug. It slows the growth of cancer cells. This medicine is used to treat testicular cancer. This medicine may be used for other purposes; ask your health care provider or pharmacist if you have questions. COMMON BRAND NAME(S): Ifex, Ifex/Mesna What should I tell my health care provider before I take this medicine? They need to know if you have any of these conditions: -bladder problems -blood disorders -dehydration -infection (especially a virus infection such as chickenpox, cold sores, or herpes) -kidney disease -liver disease -recent or ongoing radiation therapy -an unusual or allergic reaction to ifosfamide, other chemotherapy, other medicines, foods, dyes, or preservatives -pregnant or trying to get pregnant -breast-feeding How should I use  this medicine? This drug is given as an infusion into a vein. It is administered in a hospital or clinic by a specially trained health care professional. Talk to your pediatrician regarding the use of this medicine in children. Special care may be needed. Overdosage: If you think you have taken too much of this medicine contact a poison control center or emergency room at once. NOTE: This medicine is only for you. Do not share this medicine with others. What if I miss a dose? It is important not to miss your dose. Call your doctor or health care professional if you are unable to keep an appointment. What may interact with this medicine? Do not take this medicine with any of the following medications: -nalidixic acid This medicine may also interact with the following medications: -medicines to increase blood counts like filgrastim, pegfilgrastim, sargramostim -St. John's Wort -vaccines Talk to your doctor or health care professional before taking any of these medicines: -acetaminophen -aspirin -ibuprofen -ketoprofen -naproxen This list may not describe all possible interactions. Give your health care provider a list of all the medicines, herbs, non-prescription drugs, or dietary supplements you use. Also tell them if you smoke, drink alcohol, or use illegal drugs. Some items may interact with your medicine. What should I watch for while using this medicine? Visit your doctor for checks on your progress. This drug may make you feel generally unwell. This is not uncommon, as chemotherapy can affect healthy cells as well as cancer cells. Report any side effects. Continue your course of treatment even though you feel ill unless your doctor tells you to stop. Drink water or other fluids as directed. Urinate often, even at night. In  some cases, you may be given additional medicines to help with side effects. Follow all directions for their use. Call your doctor or health care professional for  advice if you get a fever, chills or sore throat, or other symptoms of a cold or flu. Do not treat yourself. This drug decreases your body's ability to fight infections. Try to avoid being around people who are sick. This medicine may increase your risk to bruise or bleed. Call your doctor or health care professional if you notice any unusual bleeding. Be careful brushing and flossing your teeth or using a toothpick because you may get an infection or bleed more easily. If you have any dental work done, tell your dentist you are receiving this medicine. Avoid taking products that contain aspirin, acetaminophen, ibuprofen, naproxen, or ketoprofen unless instructed by your doctor. These medicines may hide a fever. Do not become pregnant while taking this medicine. Women should inform their doctor if they wish to become pregnant or think they might be pregnant. There is a potential for serious side effects to an unborn child. Talk to your health care professional or pharmacist for more information. Do not breast-feed an infant while taking this medicine. What side effects may I notice from receiving this medicine? Side effects that you should report to your doctor or health care professional as soon as possible: -allergic reactions like skin rash, itching or hives, swelling of the face, lips, or tongue -low blood counts - this medicine may decrease the number of white blood cells, red blood cells and platelets. You may be at increased risk for infections and bleeding. -signs of infection - fever or chills, cough, sore throat, pain or difficulty passing urine -signs of decreased platelets or bleeding - bruising, pinpoint red spots on the skin, black, tarry stools, blood in the urine -signs of decreased red blood cells - unusually weak or tired, fainting spells, lightheadedness -agitation -breathing problems -confusion -dark urine -hallucinations -mouth sores -pain, swelling, redness at site where  injected -seizures -trouble passing urine or change in the amount of urine -yellowing of the eyes or skin Side effects that usually do not require medical attention (report to your doctor or health care professional if they continue or are bothersome): -diarrhea -hair loss -loss of appetite -nausea, vomiting This list may not describe all possible side effects. Call your doctor for medical advice about side effects. You may report side effects to FDA at 1-800-FDA-1088. Where should I keep my medicine? This drug is given in a hospital or clinic and will not be stored at home. NOTE: This sheet is a summary. It may not cover all possible information. If you have questions about this medicine, talk to your doctor, pharmacist, or health care provider.  2014, Elsevier/Gold Standard. (2007-10-27 16:23:05)  Carboplatin injection What is this medicine? CARBOPLATIN (KAR boe pla tin) is a chemotherapy drug. It targets fast dividing cells, like cancer cells, and causes these cells to die. This medicine is used to treat ovarian cancer and many other cancers. This medicine may be used for other purposes; ask your health care provider or pharmacist if you have questions. COMMON BRAND NAME(S): Paraplatin What should I tell my health care provider before I take this medicine? They need to know if you have any of these conditions: -blood disorders -hearing problems -kidney disease -recent or ongoing radiation therapy -an unusual or allergic reaction to carboplatin, cisplatin, other chemotherapy, other medicines, foods, dyes, or preservatives -pregnant or trying to get pregnant -breast-feeding How  should I use this medicine? This drug is usually given as an infusion into a vein. It is administered in a hospital or clinic by a specially trained health care professional. Talk to your pediatrician regarding the use of this medicine in children. Special care may be needed. Overdosage: If you think you have  taken too much of this medicine contact a poison control center or emergency room at once. NOTE: This medicine is only for you. Do not share this medicine with others. What if I miss a dose? It is important not to miss a dose. Call your doctor or health care professional if you are unable to keep an appointment. What may interact with this medicine? -medicines for seizures -medicines to increase blood counts like filgrastim, pegfilgrastim, sargramostim -some antibiotics like amikacin, gentamicin, neomycin, streptomycin, tobramycin -vaccines Talk to your doctor or health care professional before taking any of these medicines: -acetaminophen -aspirin -ibuprofen -ketoprofen -naproxen This list may not describe all possible interactions. Give your health care provider a list of all the medicines, herbs, non-prescription drugs, or dietary supplements you use. Also tell them if you smoke, drink alcohol, or use illegal drugs. Some items may interact with your medicine. What should I watch for while using this medicine? Your condition will be monitored carefully while you are receiving this medicine. You will need important blood work done while you are taking this medicine. This drug may make you feel generally unwell. This is not uncommon, as chemotherapy can affect healthy cells as well as cancer cells. Report any side effects. Continue your course of treatment even though you feel ill unless your doctor tells you to stop. In some cases, you may be given additional medicines to help with side effects. Follow all directions for their use. Call your doctor or health care professional for advice if you get a fever, chills or sore throat, or other symptoms of a cold or flu. Do not treat yourself. This drug decreases your body's ability to fight infections. Try to avoid being around people who are sick. This medicine may increase your risk to bruise or bleed. Call your doctor or health care professional  if you notice any unusual bleeding. Be careful brushing and flossing your teeth or using a toothpick because you may get an infection or bleed more easily. If you have any dental work done, tell your dentist you are receiving this medicine. Avoid taking products that contain aspirin, acetaminophen, ibuprofen, naproxen, or ketoprofen unless instructed by your doctor. These medicines may hide a fever. Do not become pregnant while taking this medicine. Women should inform their doctor if they wish to become pregnant or think they might be pregnant. There is a potential for serious side effects to an unborn child. Talk to your health care professional or pharmacist for more information. Do not breast-feed an infant while taking this medicine. What side effects may I notice from receiving this medicine? Side effects that you should report to your doctor or health care professional as soon as possible: -allergic reactions like skin rash, itching or hives, swelling of the face, lips, or tongue -signs of infection - fever or chills, cough, sore throat, pain or difficulty passing urine -signs of decreased platelets or bleeding - bruising, pinpoint red spots on the skin, black, tarry stools, nosebleeds -signs of decreased red blood cells - unusually weak or tired, fainting spells, lightheadedness -breathing problems -changes in hearing -changes in vision -chest pain -high blood pressure -low blood counts - This drug may  decrease the number of white blood cells, red blood cells and platelets. You may be at increased risk for infections and bleeding. -nausea and vomiting -pain, swelling, redness or irritation at the injection site -pain, tingling, numbness in the hands or feet -problems with balance, talking, walking -trouble passing urine or change in the amount of urine Side effects that usually do not require medical attention (report to your doctor or health care professional if they continue or are  bothersome): -hair loss -loss of appetite -metallic taste in the mouth or changes in taste This list may not describe all possible side effects. Call your doctor for medical advice about side effects. You may report side effects to FDA at 1-800-FDA-1088. Where should I keep my medicine? This drug is given in a hospital or clinic and will not be stored at home. NOTE: This sheet is a summary. It may not cover all possible information. If you have questions about this medicine, talk to your doctor, pharmacist, or health care provider.  2014, [employer]vier/Gold Standard. (2007-09-15 14:38:05)

## 2013-07-28 NOTE — Progress Notes (Signed)
Chemo follow-up call placed for Friday as patient comes back tomorrow for pump D/C and etoposide. Patient given chemo spill kit - Patient and daughter state understanding of how to use this in case pump was to become disconnected. Cindi Carbon, RN

## 2013-07-29 ENCOUNTER — Encounter: Payer: Self-pay | Admitting: *Deleted

## 2013-07-29 ENCOUNTER — Ambulatory Visit (HOSPITAL_BASED_OUTPATIENT_CLINIC_OR_DEPARTMENT_OTHER): Payer: Medicare Other

## 2013-07-29 DIAGNOSIS — Z5111 Encounter for antineoplastic chemotherapy: Secondary | ICD-10-CM

## 2013-07-29 DIAGNOSIS — C8590 Non-Hodgkin lymphoma, unspecified, unspecified site: Secondary | ICD-10-CM

## 2013-07-29 DIAGNOSIS — C8299 Follicular lymphoma, unspecified, extranodal and solid organ sites: Secondary | ICD-10-CM

## 2013-07-29 MED ORDER — SODIUM CHLORIDE 0.9 % IV SOLN
100.0000 mg/m2 | Freq: Once | INTRAVENOUS | Status: AC
  Administered 2013-07-29: 200 mg via INTRAVENOUS
  Filled 2013-07-29: qty 10

## 2013-07-29 MED ORDER — ONDANSETRON 8 MG/50ML IVPB (CHCC)
8.0000 mg | Freq: Once | INTRAVENOUS | Status: AC
  Administered 2013-07-29: 8 mg via INTRAVENOUS

## 2013-07-29 MED ORDER — SODIUM CHLORIDE 0.9 % IJ SOLN
10.0000 mL | INTRAMUSCULAR | Status: DC | PRN
  Administered 2013-07-29: 10 mL
  Filled 2013-07-29: qty 10

## 2013-07-29 MED ORDER — DEXAMETHASONE SODIUM PHOSPHATE 10 MG/ML IJ SOLN
10.0000 mg | Freq: Once | INTRAMUSCULAR | Status: AC
  Administered 2013-07-29: 10 mg via INTRAVENOUS

## 2013-07-29 MED ORDER — DEXAMETHASONE SODIUM PHOSPHATE 10 MG/ML IJ SOLN
INTRAMUSCULAR | Status: AC
  Filled 2013-07-29: qty 1

## 2013-07-29 MED ORDER — SODIUM CHLORIDE 0.9 % IV SOLN
Freq: Once | INTRAVENOUS | Status: AC
  Administered 2013-07-29: 13:00:00 via INTRAVENOUS

## 2013-07-29 MED ORDER — ONDANSETRON 8 MG/NS 50 ML IVPB
INTRAVENOUS | Status: AC
  Filled 2013-07-29: qty 8

## 2013-07-29 MED ORDER — HEPARIN SOD (PORK) LOCK FLUSH 100 UNIT/ML IV SOLN
500.0000 [IU] | Freq: Once | INTRAVENOUS | Status: AC | PRN
  Administered 2013-07-29: 500 [IU]
  Filled 2013-07-29: qty 5

## 2013-07-29 NOTE — Patient Instructions (Signed)
[facility] Discharge Instructions for Patients Receiving Chemotherapy  Today you received the following chemotherapy agents: Etoposide.  To help prevent nausea and vomiting after your treatment, we encourage you to take your nausea medication: Compazine 10 mg every 6 hrs as needed.   If you develop nausea and vomiting that is not controlled by your nausea medication, call the clinic.   BELOW ARE SYMPTOMS THAT SHOULD BE REPORTED IMMEDIATELY:  *FEVER GREATER THAN 100.5 F  *CHILLS WITH OR WITHOUT FEVER  NAUSEA AND VOMITING THAT IS NOT CONTROLLED WITH YOUR NAUSEA MEDICATION  *UNUSUAL SHORTNESS OF BREATH  *UNUSUAL BRUISING OR BLEEDING  TENDERNESS IN MOUTH AND THROAT WITH OR WITHOUT PRESENCE OF ULCERS  *URINARY PROBLEMS  *BOWEL PROBLEMS  UNUSUAL RASH Items with * indicate a potential emergency and should be followed up as soon as possible.  Feel free to call the clinic you have any questions or concerns. The clinic phone number is [phone].    

## 2013-07-30 ENCOUNTER — Ambulatory Visit (HOSPITAL_BASED_OUTPATIENT_CLINIC_OR_DEPARTMENT_OTHER): Payer: Medicare Other

## 2013-07-30 ENCOUNTER — Telehealth: Payer: Self-pay | Admitting: *Deleted

## 2013-07-30 VITALS — BP 117/53 | HR 94 | Temp 98.2°F

## 2013-07-30 DIAGNOSIS — C8590 Non-Hodgkin lymphoma, unspecified, unspecified site: Secondary | ICD-10-CM

## 2013-07-30 DIAGNOSIS — C8299 Follicular lymphoma, unspecified, extranodal and solid organ sites: Secondary | ICD-10-CM

## 2013-07-30 MED ORDER — PEGFILGRASTIM INJECTION 6 MG/0.6ML
6.0000 mg | Freq: Once | SUBCUTANEOUS | Status: AC
  Administered 2013-07-30: 6 mg via SUBCUTANEOUS
  Filled 2013-07-30: qty 0.6

## 2013-07-30 NOTE — Telephone Encounter (Signed)
Jack [name] here for Neulasta injection following 1st vp-16 chemotherapy.  States that he is doing fine except for being a little dizzy.  No nausea, vomiting, or diarrhea.  States that he did fall last night when he was going to the bathroom and hit his head.  Has a small bump on his left forehead.  No other bumps.  Encouraged to be careful when walking.  All questions answered.  Knows to call if he has any problems or concerns.

## 2013-07-30 NOTE — Patient Instructions (Signed)

## 2013-08-02 ENCOUNTER — Telehealth: Payer: Self-pay | Admitting: Medical Oncology

## 2013-08-02 NOTE — Telephone Encounter (Signed)
Daughter, Hassan Rowan, called to report patient's temp down to WNL @ 98.2, asking about patient able to take an herbal med, Sambucol (states it works to help boost immune system) as well as can patient stop or decrease long acting pain medication.  Advised daughter against herbal medicine and best to discuss this with Dr Alen Blew at upcoming appt 02/17 should they have more questions regarding it.  Also advised daughter for patient not to stop long acting pain medication as well which is sched for q12 hours since daughter states patient's pain is under control now. Educated daughter on the importance of maintaining pain medication for pain mgmt, and that patient has short acting pain med to take PRN if needed and if pt isn't having break through pain then he doesn't need to take.   Daughter verbalized understanding. Knows to call office with further questions or concerns.  Reviewed above with MD.  Next sched appt 08/10/13 lab/MD/tx

## 2013-08-03 ENCOUNTER — Telehealth: Payer: Self-pay | Admitting: *Deleted

## 2013-08-03 MED ORDER — MAGIC MOUTHWASH: 5.0000 mL | Freq: Four times a day (QID) | ORAL | Status: AC

## 2013-08-03 NOTE — Telephone Encounter (Signed)
Patient c/o sore throat, losing his voice. Afebrile, denies white patches in mouth. Per kristin curcio NP, magic mouthwash called to patient's pharmacy. Also in between to use warm salt water rinses and cough suppressant, d/t to coughing,  has seen blood once. If this occurs again, call back.

## 2013-08-04 ENCOUNTER — Other Ambulatory Visit: Payer: Self-pay | Admitting: Medical Oncology

## 2013-08-04 ENCOUNTER — Telehealth: Payer: Self-pay | Admitting: Internal Medicine

## 2013-08-04 ENCOUNTER — Telehealth: Payer: Self-pay | Admitting: Medical Oncology

## 2013-08-04 MED ORDER — LIDOCAINE-PRILOCAINE 2.5-2.5 % EX CREA: 1.0000 "application " | TOPICAL_CREAM | CUTANEOUS | Status: DC | PRN

## 2013-08-04 NOTE — Telephone Encounter (Signed)
Pt's daughter is in [place].  She called with concerns about her father.  Jack [name] is loosing his voice, blood in underwear, he is confused, not eating.  His granddaughter is with him and relayed this to [name].  They think he needs to go to the ER, but he refuses.  They are requesting that Dr. Linda Hedges call him. Patients phone number is [phone].

## 2013-08-04 NOTE — Telephone Encounter (Signed)
Daughter [name] called reporting patient confused and hallucinating, just started within the last 24 hours, reports random speech, hoasrness and uncontrolled laughing, patient also has started to become incontinent and caregiver has noticed "smear of blood on boxers." Will review with MD.  Denies fever or other symptoms at this time. Requesting EMLA cream refill (to be sent to [facility] pharmacy)  Last treatment 07/29/13 of Etoposide sched 08/10/13 for lab/MD/tx

## 2013-08-04 NOTE — Telephone Encounter (Signed)
Reviewed symptoms with MD, per MD, recommendations are for patient to go to ED d/t altered mental status. Daughter Donnie Coffin) gave verbal understanding and will contact patient's caregiver. Daughter knows to contact office with further questions or concerns.

## 2013-08-05 ENCOUNTER — Encounter (HOSPITAL_COMMUNITY): Payer: Self-pay | Admitting: Emergency Medicine

## 2013-08-05 ENCOUNTER — Emergency Department (HOSPITAL_COMMUNITY): Payer: Medicare Other

## 2013-08-05 ENCOUNTER — Inpatient Hospital Stay (HOSPITAL_COMMUNITY)
Admission: EM | Admit: 2013-08-05 | Discharge: 2013-08-16 | DRG: 871 | Disposition: A | Discharge status: Hospice | Payer: Medicare Other | Attending: Internal Medicine | Admitting: Internal Medicine

## 2013-08-05 DIAGNOSIS — D709 Neutropenia, unspecified: Secondary | ICD-10-CM | POA: Diagnosis present

## 2013-08-05 DIAGNOSIS — K7030 Alcoholic cirrhosis of liver without ascites: Secondary | ICD-10-CM | POA: Diagnosis present

## 2013-08-05 DIAGNOSIS — Z66 Do not resuscitate: Secondary | ICD-10-CM | POA: Diagnosis not present

## 2013-08-05 DIAGNOSIS — Z87891 Personal history of nicotine dependence: Secondary | ICD-10-CM

## 2013-08-05 DIAGNOSIS — A419 Sepsis, unspecified organism: Secondary | ICD-10-CM | POA: Diagnosis present

## 2013-08-05 DIAGNOSIS — R4182 Altered mental status, unspecified: Secondary | ICD-10-CM

## 2013-08-05 DIAGNOSIS — Z515 Encounter for palliative care: Secondary | ICD-10-CM

## 2013-08-05 DIAGNOSIS — Z96659 Presence of unspecified artificial knee joint: Secondary | ICD-10-CM

## 2013-08-05 DIAGNOSIS — E876 Hypokalemia: Secondary | ICD-10-CM | POA: Diagnosis present

## 2013-08-05 DIAGNOSIS — K219 Gastro-esophageal reflux disease without esophagitis: Secondary | ICD-10-CM | POA: Diagnosis present

## 2013-08-05 DIAGNOSIS — Z6825 Body mass index (BMI) 25.0-25.9, adult: Secondary | ICD-10-CM

## 2013-08-05 DIAGNOSIS — Z8249 Family history of ischemic heart disease and other diseases of the circulatory system: Secondary | ICD-10-CM

## 2013-08-05 DIAGNOSIS — E870 Hyperosmolality and hypernatremia: Secondary | ICD-10-CM | POA: Diagnosis not present

## 2013-08-05 DIAGNOSIS — D61818 Other pancytopenia: Secondary | ICD-10-CM

## 2013-08-05 DIAGNOSIS — N390 Urinary tract infection, site not specified: Secondary | ICD-10-CM | POA: Diagnosis present

## 2013-08-05 DIAGNOSIS — D61810 Antineoplastic chemotherapy induced pancytopenia: Secondary | ICD-10-CM | POA: Diagnosis present

## 2013-08-05 DIAGNOSIS — H409 Unspecified glaucoma: Secondary | ICD-10-CM | POA: Diagnosis present

## 2013-08-05 DIAGNOSIS — R042 Hemoptysis: Secondary | ICD-10-CM | POA: Diagnosis present

## 2013-08-05 DIAGNOSIS — R5081 Fever presenting with conditions classified elsewhere: Secondary | ICD-10-CM | POA: Diagnosis present

## 2013-08-05 DIAGNOSIS — B952 Enterococcus as the cause of diseases classified elsewhere: Secondary | ICD-10-CM | POA: Diagnosis present

## 2013-08-05 DIAGNOSIS — E871 Hypo-osmolality and hyponatremia: Secondary | ICD-10-CM | POA: Diagnosis present

## 2013-08-05 DIAGNOSIS — L02419 Cutaneous abscess of limb, unspecified: Secondary | ICD-10-CM | POA: Diagnosis present

## 2013-08-05 DIAGNOSIS — N179 Acute kidney failure, unspecified: Secondary | ICD-10-CM | POA: Diagnosis not present

## 2013-08-05 DIAGNOSIS — C8590 Non-Hodgkin lymphoma, unspecified, unspecified site: Secondary | ICD-10-CM

## 2013-08-05 DIAGNOSIS — D72819 Decreased white blood cell count, unspecified: Secondary | ICD-10-CM

## 2013-08-05 DIAGNOSIS — J438 Other emphysema: Secondary | ICD-10-CM | POA: Diagnosis present

## 2013-08-05 DIAGNOSIS — R6510 Systemic inflammatory response syndrome (SIRS) of non-infectious origin without acute organ dysfunction: Secondary | ICD-10-CM | POA: Diagnosis present

## 2013-08-05 DIAGNOSIS — I10 Essential (primary) hypertension: Secondary | ICD-10-CM | POA: Diagnosis present

## 2013-08-05 DIAGNOSIS — E785 Hyperlipidemia, unspecified: Secondary | ICD-10-CM | POA: Diagnosis present

## 2013-08-05 DIAGNOSIS — R682 Dry mouth, unspecified: Secondary | ICD-10-CM

## 2013-08-05 DIAGNOSIS — L03119 Cellulitis of unspecified part of limb: Secondary | ICD-10-CM

## 2013-08-05 DIAGNOSIS — D649 Anemia, unspecified: Secondary | ICD-10-CM | POA: Diagnosis present

## 2013-08-05 DIAGNOSIS — K117 Disturbances of salivary secretion: Secondary | ICD-10-CM

## 2013-08-05 DIAGNOSIS — T451X5A Adverse effect of antineoplastic and immunosuppressive drugs, initial encounter: Secondary | ICD-10-CM | POA: Diagnosis present

## 2013-08-05 DIAGNOSIS — L0390 Cellulitis, unspecified: Secondary | ICD-10-CM | POA: Diagnosis present

## 2013-08-05 DIAGNOSIS — Z9221 Personal history of antineoplastic chemotherapy: Secondary | ICD-10-CM

## 2013-08-05 DIAGNOSIS — H3530 Unspecified macular degeneration: Secondary | ICD-10-CM | POA: Diagnosis present

## 2013-08-05 DIAGNOSIS — M533 Sacrococcygeal disorders, not elsewhere classified: Secondary | ICD-10-CM

## 2013-08-05 DIAGNOSIS — E43 Unspecified severe protein-calorie malnutrition: Secondary | ICD-10-CM | POA: Diagnosis present

## 2013-08-05 DIAGNOSIS — I2699 Other pulmonary embolism without acute cor pulmonale: Secondary | ICD-10-CM | POA: Diagnosis present

## 2013-08-05 DIAGNOSIS — D703 Neutropenia due to infection: Secondary | ICD-10-CM | POA: Diagnosis present

## 2013-08-05 DIAGNOSIS — R531 Weakness: Secondary | ICD-10-CM

## 2013-08-05 DIAGNOSIS — Z79899 Other long term (current) drug therapy: Secondary | ICD-10-CM

## 2013-08-05 DIAGNOSIS — G9341 Metabolic encephalopathy: Secondary | ICD-10-CM | POA: Diagnosis present

## 2013-08-05 DIAGNOSIS — R1310 Dysphagia, unspecified: Secondary | ICD-10-CM | POA: Diagnosis present

## 2013-08-05 DIAGNOSIS — C8299 Follicular lymphoma, unspecified, extranodal and solid organ sites: Secondary | ICD-10-CM | POA: Diagnosis present

## 2013-08-05 DIAGNOSIS — Z880 Allergy status to penicillin: Secondary | ICD-10-CM

## 2013-08-05 DIAGNOSIS — D696 Thrombocytopenia, unspecified: Secondary | ICD-10-CM | POA: Diagnosis present

## 2013-08-05 DIAGNOSIS — R0602 Shortness of breath: Secondary | ICD-10-CM

## 2013-08-05 DIAGNOSIS — C8589 Other specified types of non-Hodgkin lymphoma, extranodal and solid organ sites: Secondary | ICD-10-CM

## 2013-08-05 LAB — DIFFERENTIAL
BAND NEUTROPHILS: 0 % (ref 0–10)
Basophils Absolute: 0 10*3/uL (ref 0.0–0.1)
Basophils Relative: 0 % (ref 0–1)
EOS PCT: 0 % (ref 0–5)
Eosinophils Absolute: 0 10*3/uL (ref 0.0–0.7)
LYMPHS PCT: 0 % — AB (ref 12–46)
Lymphs Abs: 0 10*3/uL — ABNORMAL LOW (ref 0.7–4.0)
MONOS PCT: 0 % — AB (ref 3–12)
Monocytes Absolute: 0 10*3/uL — ABNORMAL LOW (ref 0.1–1.0)
Neutrophils Relative %: 0 % — ABNORMAL LOW (ref 43–77)
nRBC: 0 /100 WBC

## 2013-08-05 LAB — URINE MICROSCOPIC-ADD ON

## 2013-08-05 LAB — URINALYSIS, ROUTINE W REFLEX MICROSCOPIC
BILIRUBIN URINE: NEGATIVE
Glucose, UA: NEGATIVE mg/dL
Ketones, ur: 40 mg/dL — AB
Leukocytes, UA: NEGATIVE
Nitrite: NEGATIVE
PH: 5.5 (ref 5.0–8.0)
Protein, ur: 100 mg/dL — AB
Specific Gravity, Urine: 1.026 (ref 1.005–1.030)
UROBILINOGEN UA: 0.2 mg/dL (ref 0.0–1.0)

## 2013-08-05 LAB — POCT I-STAT, CHEM 8
BUN: 14 mg/dL (ref 6–23)
CALCIUM ION: 1.26 mmol/L (ref 1.13–1.30)
Chloride: 98 mEq/L (ref 96–112)
Creatinine, Ser: 1.2 mg/dL (ref 0.50–1.35)
GLUCOSE: 96 mg/dL (ref 70–99)
HEMATOCRIT: 20 % — AB (ref 39.0–52.0)
HEMOGLOBIN: 6.8 g/dL — AB (ref 13.0–17.0)
Potassium: 3.2 mEq/L — ABNORMAL LOW (ref 3.7–5.3)
Sodium: 130 mEq/L — ABNORMAL LOW (ref 137–147)
TCO2: 18 mmol/L (ref 0–100)

## 2013-08-05 LAB — CBC
HCT: 19.2 % — ABNORMAL LOW (ref 39.0–52.0)
HEMOGLOBIN: 6.6 g/dL — AB (ref 13.0–17.0)
MCH: 27.4 pg (ref 26.0–34.0)
MCHC: 34.4 g/dL (ref 30.0–36.0)
MCV: 79.7 fL (ref 78.0–100.0)
Platelets: 5 10*3/uL — CL (ref 150–400)
RBC: 2.41 MIL/uL — ABNORMAL LOW (ref 4.22–5.81)
RDW: 15.6 % — ABNORMAL HIGH (ref 11.5–15.5)
WBC: 0.1 10*3/uL — AB (ref 4.0–10.5)

## 2013-08-05 LAB — COMPREHENSIVE METABOLIC PANEL
ALT: 11 U/L (ref 0–53)
AST: 22 U/L (ref 0–37)
Albumin: 2.7 g/dL — ABNORMAL LOW (ref 3.5–5.2)
Alkaline Phosphatase: 67 U/L (ref 39–117)
BUN: 16 mg/dL (ref 6–23)
CO2: 19 meq/L (ref 19–32)
Calcium: 8.6 mg/dL (ref 8.4–10.5)
Chloride: 95 mEq/L — ABNORMAL LOW (ref 96–112)
Creatinine, Ser: 1.11 mg/dL (ref 0.50–1.35)
GFR calc Af Amer: 69 mL/min — ABNORMAL LOW (ref >90)
GFR, EST NON AFRICAN AMERICAN: 60 mL/min — AB (ref >90)
Glucose, Bld: 101 mg/dL — ABNORMAL HIGH (ref 70–99)
POTASSIUM: 3.3 meq/L — AB (ref 3.7–5.3)
SODIUM: 129 meq/L — AB (ref 137–147)
Total Bilirubin: 0.5 mg/dL (ref 0.3–1.2)
Total Protein: 5.7 g/dL — ABNORMAL LOW (ref 6.0–8.3)

## 2013-08-05 LAB — MRSA PCR SCREENING: MRSA by PCR: POSITIVE — AB

## 2013-08-05 LAB — CG4 I-STAT (LACTIC ACID): Lactic Acid, Venous: 0.55 mmol/L (ref 0.5–2.2)

## 2013-08-05 LAB — OCCULT BLOOD, POC DEVICE: Fecal Occult Bld: NEGATIVE

## 2013-08-05 LAB — PREPARE RBC (CROSSMATCH)

## 2013-08-05 MED ORDER — SODIUM CHLORIDE 0.9 % IV SOLN
INTRAVENOUS | Status: AC
  Administered 2013-08-06: 11:00:00 via INTRAVENOUS

## 2013-08-05 MED ORDER — IOHEXOL 350 MG/ML SOLN
100.0000 mL | Freq: Once | INTRAVENOUS | Status: AC | PRN
  Administered 2013-08-05: 100 mL via INTRAVENOUS

## 2013-08-05 MED ORDER — LEVOFLOXACIN IN D5W 750 MG/150ML IV SOLN
750.0000 mg | INTRAVENOUS | Status: DC
  Administered 2013-08-06 – 2013-08-08 (×3): 750 mg via INTRAVENOUS
  Filled 2013-08-05 (×4): qty 150

## 2013-08-05 MED ORDER — LEVOFLOXACIN IN D5W 750 MG/150ML IV SOLN
750.0000 mg | INTRAVENOUS | Status: AC
  Administered 2013-08-05: 750 mg via INTRAVENOUS
  Filled 2013-08-05: qty 150

## 2013-08-05 MED ORDER — CHLORHEXIDINE GLUCONATE CLOTH 2 % EX PADS
6.0000 | MEDICATED_PAD | Freq: Every day | CUTANEOUS | Status: AC
  Administered 2013-08-06 – 2013-08-10 (×5): 6 via TOPICAL

## 2013-08-05 MED ORDER — SODIUM CHLORIDE 0.9 % IV BOLUS (SEPSIS)
500.0000 mL | Freq: Once | INTRAVENOUS | Status: AC
Start: 2013-08-05 — End: 2013-08-05
  Administered 2013-08-05: 500 mL via INTRAVENOUS

## 2013-08-05 MED ORDER — DEXTROSE 5 % IV SOLN
2.0000 g | Freq: Three times a day (TID) | INTRAVENOUS | Status: DC
  Administered 2013-08-05 – 2013-08-09 (×12): 2 g via INTRAVENOUS
  Filled 2013-08-05 (×14): qty 2

## 2013-08-05 MED ORDER — DEXTROSE 5 % IV SOLN
2.0000 g | INTRAVENOUS | Status: AC
  Administered 2013-08-05: 2 g via INTRAVENOUS

## 2013-08-05 MED ORDER — VANCOMYCIN HCL IN DEXTROSE 750-5 MG/150ML-% IV SOLN
750.0000 mg | Freq: Two times a day (BID) | INTRAVENOUS | Status: DC
  Administered 2013-08-05 – 2013-08-07 (×5): 750 mg via INTRAVENOUS
  Filled 2013-08-05 (×6): qty 150

## 2013-08-05 MED ORDER — MUPIROCIN 2 % EX OINT
1.0000 "application " | TOPICAL_OINTMENT | Freq: Two times a day (BID) | CUTANEOUS | Status: AC
  Administered 2013-08-05 – 2013-08-10 (×10): 1 via NASAL
  Filled 2013-08-05 (×2): qty 22

## 2013-08-05 MED ORDER — SODIUM CHLORIDE 0.9 % IV BOLUS (SEPSIS)
1000.0000 mL | Freq: Once | INTRAVENOUS | Status: AC
  Administered 2013-08-05: 1000 mL via INTRAVENOUS

## 2013-08-05 MED ORDER — POTASSIUM CHLORIDE CRYS ER 20 MEQ PO TBCR
40.0000 meq | EXTENDED_RELEASE_TABLET | Freq: Once | ORAL | Status: AC
  Administered 2013-08-05: 20 meq via ORAL
  Filled 2013-08-05: qty 2

## 2013-08-05 MED ORDER — OXYCODONE HCL ER 20 MG PO T12A
20.0000 mg | EXTENDED_RELEASE_TABLET | Freq: Two times a day (BID) | ORAL | Status: DC
  Administered 2013-08-05 – 2013-08-09 (×9): 20 mg via ORAL
  Filled 2013-08-05 (×5): qty 1
  Filled 2013-08-05: qty 2
  Filled 2013-08-05 (×3): qty 1

## 2013-08-05 MED ORDER — VANCOMYCIN HCL IN DEXTROSE 1-5 GM/200ML-% IV SOLN
1000.0000 mg | INTRAVENOUS | Status: AC
  Administered 2013-08-05: 1000 mg via INTRAVENOUS
  Filled 2013-08-05: qty 200

## 2013-08-05 MED ORDER — HYDROMORPHONE HCL PF 1 MG/ML IJ SOLN: 1.0000 mg | INTRAMUSCULAR | Status: AC | PRN

## 2013-08-05 NOTE — Progress Notes (Signed)
Utilization Review completed.  Rochell Mabie RN CM  

## 2013-08-05 NOTE — Progress Notes (Signed)
Courtesy note: Patient seen last Friday -discussed his current situation and treatments. He wants to pursue aggressive treatment. He did agree to palliative care consult if it could be arranged as an outpatient.  Events of today and this admission reviewed including lab and CT angio chest.   Patient seen - appears weak and debilitated. He is not fully cogent but clearly wants to pursue treatment.  Opened discussion about code status: discussed poor probability of success of CPR in the event of cardiac arrest and the implications of respiratory failure to such a degree as to require intubation. His family was present for this discussion. He is not fully competent at this time but did express desire to continue aggressive treatment.   Discussed his condition with his daughters: they do concur that in the past he was wary of heroic treatment and against CPR. The will talk with him further in regard to Code status.  Please consider requesting Palliative care consult for Goals of Care and consideration of outpatient palliative care and possibly hospice.

## 2013-08-05 NOTE — ED Provider Notes (Signed)
CSN: [id number]     Arrival date & time 08/05/13  0615 History   First MD Initiated Contact with Patient 08/05/13 [phone]     Chief Complaint  Patient presents with  . Hemoptysis     (Consider location/radiation/quality/duration/timing/severity/associated sxs/prior Treatment) HPI Comments: 80 y.o. male with Pmhx as above including recurrent diffuse large cell lymphoma who started new chemo regimen about 1.5 wks ago who presents with generalized weakness, poor PO intake, 2 days of increased cough with scant hemoptysis, sore throat, inc AMS. Denies fevers, SOB above baseline, n/v, ab pain, fever.  He has had intermittent constipation/d/a.   Patient is a 80 y.o. male presenting with cough. The history is provided by the patient and a relative. No language interpreter was used.  Cough Cough characteristics:  Productive Sputum characteristics:  Bloody Severity:  Moderate Onset quality:  Gradual Duration:  2 days Timing:  Intermittent Progression:  Unchanged Chronicity:  New Smoker: no   Relieved by:  Nothing Worsened by:  Nothing tried Ineffective treatments:  None tried Associated symptoms: fever and shortness of breath (at baseline)   Associated symptoms: no chest pain, no chills, no headaches, no rash, no rhinorrhea and no sinus congestion   Associated symptoms comment:  Poor PO intake, fatigue, altered mental status, hematuria seen on boxers, but not when urinating. Fever:    Duration:  1 day   Timing:  Intermittent   Max temp PTA (F):  100.5   Temp source:  Oral Shortness of breath:    Severity:  Moderate   Onset quality:  Unable to specify   Duration: chronic, reported as unchanged.   Progression:  Unchanged Risk factors comment:  On chemo   Past Medical History  Diagnosis Date  . GERD (gastroesophageal reflux disease)   . Osteoarthritis   . Alcohol abuse     in recovery 2006  . 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 wine only  .  Irritable bowel   . Hyperlipidemia   . Insomnia   . PAC (premature atrial contraction)     stable (ruled out for a fib)  . Prostatitis     chronic bacterial  . Pseudogout   . Sinus problem     chronic  . Cellulitis     2nd to T. Pedis(not at present)  . Hearing loss 03-10-12    bilateral hearing aids  . Complication of anesthesia 03-10-12    after shoulder  surgery -difficulty awakening,breathing problems  . Emphysema 03-10-12    tx. inhalers, uses steam room frequently  . Cramping of feet 03-10-12    cramping of both legs and hands occ.  . Osteoarthritis of left knee 03/17/2012  . Glaucoma   . Macular degeneration   . Hypertension   . Shortness of breath   . Non Hodgkin's lymphoma 03-10-12     '07-1 yr. in remission([facility])-not seeing now   Past Surgical History  Procedure Laterality Date  . Esophagogastroduodenoscopy  01-27-02  . Inguinal hernia repair  1979  . Torn biceps repair  03-10-12    Left rotator cuff repair  . Hernia repair    . Cataract surgery  03-10-12    03-04-12(right)/ (03-11-12-left)  . Total knee arthroplasty  03/17/2012    Procedure: TOTAL KNEE ARTHROPLASTY;  Surgeon: Johnny Bridge, MD;  Location: [facility];  Service: Orthopedics;  Laterality: Left;  Marland Kitchen Eye surgery  2014    cataract extraction with IOL both eyes staged  . Colonoscopy    .  Rotator cuff repair Left   . Total knee arthroplasty Right 03/22/2013    Procedure: TOTAL KNEE ARTHROPLASTY;  Surgeon: Johnny Bridge, MD;  Location: [place];  Service: Orthopedics;  Laterality: Right;  righ total knee arthroplasty   Family History  Problem Relation Age of Onset  . Coronary artery disease Father   . Heart attack Father   . Cancer Brother     colon  . Other Other     TB   History  Substance Use Topics  . Smoking status: Former Smoker    Types: Cigarettes    Quit date: 06/24/1978  . Smokeless tobacco: Never Used  . Alcohol Use: 6.7 oz/week    7 Glasses of wine, 5 Drinks containing 0.5 oz of alcohol per  week     Comment: Currently only drinks wine - in moderation. He is cutting back-past hx. ETOH abuse    Review of Systems  Constitutional: Positive for fever, activity change, appetite change and fatigue. Negative for chills.  HENT: Negative for congestion, facial swelling, rhinorrhea and trouble swallowing.   Eyes: Negative for photophobia and pain.  Respiratory: Positive for cough and shortness of breath (at baseline). Negative for chest tightness.        Hemoptysis  Cardiovascular: Negative for chest pain and leg swelling.  Gastrointestinal: Negative for nausea, vomiting, abdominal pain, diarrhea and constipation.  Endocrine: Negative for polydipsia and polyuria.  Genitourinary: Positive for hematuria (seen on boxers, but not when urinating). Negative for dysuria, urgency, decreased urine volume and difficulty urinating.  Musculoskeletal: Negative for back pain and gait problem.  Skin: Negative for color change, rash and wound.  Allergic/Immunologic: Negative for immunocompromised state.  Neurological: Negative for dizziness, facial asymmetry, speech difficulty, weakness, numbness and headaches.  Psychiatric/Behavioral: Positive for confusion. Negative for decreased concentration and agitation.      Allergies  Amoxicillin; Latex; and Penicillins  Home Medications   No current outpatient prescriptions on file. BP 135/51  Pulse 108  Temp(Src) 98.3 F (36.8 C) (Oral)  Resp 23  Ht 5\' 11"  (1.803 m)  Wt 200 lb 13.4 oz (91.1 kg)  BMI 28.02 kg/m2  SpO2 98% Physical Exam  Constitutional: He is oriented to person, place, and time. He appears well-developed and well-nourished. He has a sickly appearance. No distress.  HENT:  Head: Normocephalic and atraumatic.  Mouth/Throat: No oropharyngeal exudate.  Eyes: Pupils are equal, round, and reactive to light.  Neck: Normal range of motion. Neck supple.  Cardiovascular: Normal rate, regular rhythm and normal heart sounds.  Exam reveals  no gallop and no friction rub.   No murmur heard. Pulmonary/Chest: Effort normal. No respiratory distress. He has no wheezes. He has rales.  Abdominal: Soft. Bowel sounds are normal. He exhibits distension. He exhibits no mass. There is no tenderness. There is no rebound and no guarding.  Musculoskeletal: Normal range of motion. He exhibits no edema and no tenderness.  Neurological: He is alert and oriented to person, place, and time.  Skin: Skin is warm and dry. There is pallor.  Psychiatric: He has a normal mood and affect.    ED Course  Procedures (including critical care time) Labs Review Labs Reviewed  MRSA PCR SCREENING - Abnormal; Notable for the following:    MRSA by PCR POSITIVE (*)    All other components within normal limits  CBC - Abnormal; Notable for the following:    WBC 0.1 (*)    RBC 2.41 (*)    Hemoglobin 6.6 (*)  HCT 19.2 (*)    RDW 15.6 (*)    Platelets 5 (*)    All other components within normal limits  COMPREHENSIVE METABOLIC PANEL - Abnormal; Notable for the following:    Sodium 129 (*)    Potassium 3.3 (*)    Chloride 95 (*)    Glucose, Bld 101 (*)    Total Protein 5.7 (*)    Albumin 2.7 (*)    GFR calc non Af Amer 60 (*)    GFR calc Af Amer 69 (*)    All other components within normal limits  URINALYSIS, ROUTINE W REFLEX MICROSCOPIC - Abnormal; Notable for the following:    APPearance CLOUDY (*)    Hgb urine dipstick MODERATE (*)    Ketones, ur 40 (*)    Protein, ur 100 (*)    All other components within normal limits  DIFFERENTIAL - Abnormal; Notable for the following:    Neutrophils Relative % 0 (*)    Lymphocytes Relative 0 (*)    Monocytes Relative 0 (*)    Lymphs Abs 0.0 (*)    Monocytes Absolute 0.0 (*)    All other components within normal limits  URINE MICROSCOPIC-ADD ON - Abnormal; Notable for the following:    Casts GRANULAR CAST (*)    All other components within normal limits  POCT I-STAT, CHEM 8 - Abnormal; Notable for the  following:    Sodium 130 (*)    Potassium 3.2 (*)    Hemoglobin 6.8 (*)    HCT 20.0 (*)    All other components within normal limits  URINE CULTURE  CULTURE, BLOOD (ROUTINE X 2)  CULTURE, BLOOD (ROUTINE X 2)  CBC WITH DIFFERENTIAL  COMPREHENSIVE METABOLIC PANEL  PROTIME-INR  MAGNESIUM  PHOSPHORUS  OCCULT BLOOD, POC DEVICE  CG4 I-STAT (LACTIC ACID)  TYPE AND SCREEN  PREPARE RBC (CROSSMATCH)  PREPARE PLATELET PHERESIS   Imaging Review Ct Angio Chest Pe W/cm &/or Wo Cm  08/05/2013   CLINICAL DATA:  Hemoptysis; history of lymphoma  EXAM: CT ANGIOGRAPHY CHEST WITH CONTRAST  TECHNIQUE: Multidetector CT imaging of the chest was performed using the standard protocol during bolus administration of intravenous contrast. Multiplanar CT image reconstructions and MIPs were obtained to evaluate the vascular anatomy.  CONTRAST:  127mL OMNIPAQUE IOHEXOL 350 MG/ML SOLN  COMPARISON:  Chest CT June 29, 2013 and chest radiograph August 05, 2013  FINDINGS: There is a small pulmonary embolus in the left superior segment lower lobe pulmonary artery. This finding is best seen on axial series 6 slice 449. No larger pulmonary emboli are apparent. There is no thoracic aortic aneurysm or dissection.  There is patchy airspace consolidation in the left base. There is a degree of underlying emphysematous change.  On axial slice 48, series 7, there is an ill-defined nodular opacity which is new measuring 8 x 6 mm just lateral to the left hilum in the posterior segment of the left upper lobe. There is a calcified granuloma in the posterior segment of the left upper lobe which is stable.  There are small mediastinal lymph nodes but no adenopathy by size criteria. Port-A-Cath tip is in the superior vena cava. Pericardium is not thickened.  In the visualized upper abdomen, there is atherosclerotic change in the aorta as well as calcified granulomas in the liver and spleen. No upper abdominal adenopathy is seen. There are no  appreciable blastic or lytic bone lesions. Thyroid appears normal.  Review of the MIP images confirms the above findings.  IMPRESSION: Small pulmonary embolus incompletely obstructing the left lower lobe superior segment pulmonary artery. No other pulmonary emboli seen.  New nodular opacity in the left perihilar region measuring 8 x 6 mm. Given known lymphoma, a followup study in 10-12 weeks may be advised to further assess.  Evidence of prior granulomatous disease.  No adenopathy. Critical Value/emergent results were called by telephone at the time of interpretation on 08/05/2013 at 8:54 AM to Dr. Jinny Blossom, [facility] , who verbally acknowledged these results.   Electronically Signed   By: Lowella Grip M.D.   On: 08/05/2013 08:55   Dg Chest Portable 1 View  08/05/2013   CLINICAL DATA:  Lymphoma.  Spitting up blood.  EXAM: PORTABLE CHEST - 1 VIEW  COMPARISON:  CT chest, abdomen and pelvis 06/29/2013 and PA and lateral chest 06/01/2013.  FINDINGS: Port-A-Cath is in place. Punctate calcified granuloma left upper lobe is identified. The lungs are otherwise clear. Heart size is normal. No pneumothorax or pleural effusion.  IMPRESSION: No acute disease.   Electronically Signed   By: Inge Rise M.D.   On: 08/05/2013 07:40      MDM   Final diagnoses:  SIRS (systemic inflammatory response syndrome)  Pancytopenia  Hemoptysis  General weakness  Pulmonary embolism  Neutropenia   1. SIRS (systemic inflammatory response syndrome)   2. Pancytopenia   3. Hemoptysis   4. General weakness   5. Pulmonary embolism   6. Neutropenia      Pt is a 80 y.o. male with Pmhx as above including recurrent diffuse large cell lymphoma who started new chemo regimen about 1.5 wks ago who presents with generalized weakness, poor PO intake, 2 days of increased cough with scant hemoptysis, sore throat, inc AMS and temp of 100.5 at home early this morning. Denies SOB above baseline, n/v, ab pain.  He has had intermittent  constipation/d/a.  On PE, Pt has low grade temp, is tachycardic, pale, chronically ill appearing. GCS 14.  Crackles at bases.  Abdomen distended but non-tender.  +BLLE edema, L>R which family states is baseline. W/U shows multiple lab abnormalities including pancytopenia with WBC 0.1, Hb 6.27m plt 5, Na 129, K 3.3, Cl 95.  CTA chest ordered given active malignancy w/ hemotysis & tachycardia and pt found to have small PE.  Given pancytopenia, decision made to hold anticogulants.  Broad spectrum abx given due to neutropenia w/ fever.  Also ordered 1U PRBC and plts.  [facility] consulted, will admit to stepdown.         Neta Ehlers, MD 08/05/13 [phone]

## 2013-08-05 NOTE — Progress Notes (Signed)
Palliative Medicine consult received and prior documentation noted and appreciated. I have reviewed his record in detail and will coordinate an in depth discussion on goals to include his family. Mr. Zayas delirium, existing debility, pulmonary emboli, and relapsed disease all carry a very poor prognosis. His acute presentation with neutropenic fever, severe anemia and thrombocytopenia post chemotherapy may be life threatening and warrant prompt discussion of code status at minimum. Our team can begin to prepare him and his family for the more difficult decisions approaching and how this disease trajectory may look based on his choices.  Lane Hacker, DO Palliative Medicine

## 2013-08-05 NOTE — Consult Note (Signed)
Reason for consult: Lymphoma, pancytopenia and sepsis.   HPI: This is an 80 year old gentleman well-known to me with the following issues:  He was diagnosed with stage II follicular lymphoma diagnosed in 2007. He was in remission until November of 2014 with a presented with a relapse pelvic disease and lower remedy edema  Prior Therapy: Patient treated with 6 cycles of CHOP with rituximab. Therapy concluded August 2007. He had a complete response and had been in remission since that time.  Salvage chemotherapy status post the first cycle of bendamustine and rituximab. He is status post 3 cycles of this regimen  Current therapy: He is status post salvage chemotherapy with ifosfamide, carboplatin, etoposide and Rituxan given on February 3 through the fifth.  Patient presented with altered mental status, hemoptysis and hematochezia and possible sepsis. His laboratory data showed severe pancytopenia and possible sepsis. He was transfused with blood and platelets and was started on empiric antibiotics. Clinically this afternoon, he is feeling much improved he is awake and alert. He is reporting some hoarseness but no active bleeding. Is not reporting any fevers or chills. He did have a temperature at home but now have resolved. He is hemodynamically stable as well.  His review of systems revealed that he did not have any headaches blurred vision double vision he did have alteration of mental status and confusion. He does not report any chest pain but does reports shortness of breath and dyspnea on exertion. He did not report any cough or wheezing. He did have hemoptysis but no hematemesis. Does not report any nausea vomiting constipation diarrhea but did have an episode of hematochezia. Is that report any hematuria or dysuria. Does not report any musculoskeletal complaints or back pain. Did not report any heat or cold intolerance. He did have easy bruising and petechiae. He did not have any lymphadenopathy  noted.    Past Medical History  Diagnosis Date  . GERD (gastroesophageal reflux disease)   . Osteoarthritis   . Alcohol abuse     in recovery 2006  . 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 wine only  . Irritable bowel   . Hyperlipidemia   . Insomnia   . PAC (premature atrial contraction)     stable (ruled out for a fib)  . Prostatitis     chronic bacterial  . Pseudogout   . Sinus problem     chronic  . Cellulitis     2nd to T. Pedis(not at present)  . Hearing loss 03-10-12    bilateral hearing aids  . Complication of anesthesia 03-10-12    after shoulder  surgery -difficulty awakening,breathing problems  . Emphysema 03-10-12    tx. inhalers, uses steam room frequently  . Cramping of feet 03-10-12    cramping of both legs and hands occ.  . Osteoarthritis of left knee 03/17/2012  . Glaucoma   . Macular degeneration   . Hypertension   . Shortness of breath   . Non Hodgkin's lymphoma 03-10-12     '07-1 yr. in remission(Talena Neira)-not seeing now  :  Past Surgical History  Procedure Laterality Date  . Esophagogastroduodenoscopy  01-27-02  . Inguinal hernia repair  1979  . Torn biceps repair  03-10-12    Left rotator cuff repair  . Hernia repair    . Cataract surgery  03-10-12    03-04-12(right)/ (03-11-12-left)  . Total knee arthroplasty  03/17/2012    Procedure: TOTAL KNEE ARTHROPLASTY;  Surgeon: Lenetta Quaker  Mardelle Matte, MD;  Location: [facility];  Service: Orthopedics;  Laterality: Left;  Marland Kitchen Eye surgery  2014    cataract extraction with IOL both eyes staged  . Colonoscopy    . Rotator cuff repair Left   . Total knee arthroplasty Right 03/22/2013    Procedure: TOTAL KNEE ARTHROPLASTY;  Surgeon: Johnny Bridge, MD;  Location: [place];  Service: Orthopedics;  Laterality: Right;  righ total knee arthroplasty  :  Current Facility-Administered Medications  Medication Dose Route Frequency Provider Last Rate Last Dose  . aztreonam (AZACTAM) 2 g in dextrose 5 %  50 mL IVPB  2 g Intravenous 3 times per day Joycelyn Rua, RPH      . HYDROmorphone (DILAUDID) injection 1 mg  1 mg Intravenous Q4H PRN Neta Ehlers, MD      . Derrill Memo ON 08/06/2013] levofloxacin (LEVAQUIN) IVPB 750 mg  750 mg Intravenous Q24H Randall K Absher, RPH      . OxyCODONE (OXYCONTIN) 12 hr tablet 20 mg  20 mg Oral Q12H Neta Ehlers, MD   20 mg at 08/05/13 1010  . vancomycin (VANCOCIN) IVPB 750 mg/150 ml premix  750 mg Intravenous Q12H Julieta Bellini Absher, [facility]       Current Outpatient Prescriptions  Medication Sig Dispense Refill  . albuterol (PROVENTIL HFA;VENTOLIN HFA) 108 (90 BASE) MCG/ACT inhaler Inhale 2 puffs into the lungs every 6 (six) hours as needed for shortness of breath.       . alfuzosin (UROXATRAL) 10 MG 24 hr tablet Take 10 mg by mouth every morning.       Marland Kitchen allopurinol (ZYLOPRIM) 100 MG tablet Take 1 tablet (100 mg total) by mouth daily.  30 tablet  5  . Alum & Mag Hydroxide-Simeth (MAGIC MOUTHWASH) SOLN Take 5 mLs by mouth 4 (four) times daily.  250 mL  0  . aspirin EC 81 MG tablet Take 81 mg by mouth every evening.       Marland Kitchen dextromethorphan (DELSYM) 30 MG/5ML liquid Take 60 mg by mouth 2 (two) times daily.      Marland Kitchen esomeprazole (NEXIUM) 40 MG capsule Take 40 mg by mouth daily before breakfast.      . famciclovir (FAMVIR) 500 MG tablet Take 250 mg by mouth daily.      . Fluticasone-Salmeterol (ADVAIR) 500-50 MCG/DOSE AEPB Inhale 1 puff into the lungs 2 (two) times daily.      . hyoscyamine (LEVSIN SL) 0.125 MG SL tablet dissolve 1 tablet under the tongue every 2 hours if needed for ABDOMINAL CRAMPS  48 tablet  1  . lidocaine-prilocaine (EMLA) cream Apply 1 application topically as needed. Apply to port 1-2 hours before chemotherapy.  30 g  1  . magnesium citrate SOLN Take 1 Bottle by mouth once.      . magnesium oxide (MAG-OX) 400 MG tablet Take 400 mg by mouth daily.      . Menthol, Topical Analgesic, 10 % LIQD Apply 1 application topically 3 (three) times daily as  needed (pain).      . metoprolol succinate (TOPROL-XL) 25 MG 24 hr tablet Take 25 mg by mouth daily.      . Multiple Vitamins-Minerals (PRESERVISION AREDS PO) Take 1 tablet by mouth 2 (two) times daily.      Marland Kitchen oxyCODONE (OXY IR/ROXICODONE) 5 MG immediate release tablet Take one to two tablets every four hours as needed.  60 tablet  0  . OxyCODONE (OXYCONTIN) 20 mg T12A 12 hr tablet Take 1  tablet (20 mg total) by mouth every 12 (twelve) hours.  60 tablet  0  . potassium chloride SA (K-DUR,KLOR-CON) 20 MEQ tablet take 1 tablet by mouth once daily  30 tablet  5  . prochlorperazine (COMPAZINE) 10 MG tablet Take 1 tablet (10 mg total) by mouth every 6 (six) hours as needed for nausea or vomiting.  30 tablet  2  . quiNINE (QUALAQUIN) 324 MG capsule Take 324 mg by mouth 2 (two) times daily.      Marland Kitchen senna-docusate (SENOKOT-S) 8.6-50 MG per tablet Take 1 tablet by mouth 2 (two) times daily.  60 tablet  1  . simvastatin (ZOCOR) 20 MG tablet Take 20 mg by mouth every evening.      . traZODone (DESYREL) 50 MG tablet Take 1 tablet (50 mg total) by mouth at bedtime as needed for sleep.  30 tablet  3       Allergies  Allergen Reactions  . Amoxicillin     REACTION: rash  . Latex Rash  . Penicillins Rash  :  Family History  Problem Relation Age of Onset  . Coronary artery disease Father   . Heart attack Father   . Cancer Brother     colon  . Other Other     TB  :  History   Social History  . Marital Status: Widowed    Spouse Name: N/A    Number of Children: 3  . Years of Education: 18   Occupational History  . 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 Smoking status: Former Smoker    Types: Cigarettes    Quit date: 06/24/1978  . Smokeless tobacco: Never Used  . Alcohol Use: 6.7 oz/week    7 Glasses of wine, 5 Drinks containing 0.5 oz of alcohol per week     Comment: Currently only drinks wine - in moderation. He is cutting back-past hx. ETOH abuse  . Drug Use:  No  . Sexual Activity: Yes   Other Topics Concern  . Not on file   Social History Narrative   [employer]. married '56- widowed '07. 1 son, 2 daughters, 4 grandchildren   work: former Clinical biochemist; very active in civic affairs. lives alone: completed  [place] continues to [place] improvement ('12), keeps up his [place] house; takes his extended family on great trips - [place] and [place] July '12. Socially active.  Active in AA      End of Life issues: He does want CPR; no prolonged intubation; no futile or heroic measure to maintain him if the quality of life isn't good. HCPOA  Son - Karo Rog (c) [phone].  :  Pertinent items are noted in HPI.  Exam: ECOG 2 Blood pressure 120/96, pulse 114, temperature 99.5 F (37.5 C), temperature source Oral, resp. rate 20, SpO2 99.00%. General appearance: alert and cooperative Head: Normocephalic, without obvious abnormality, atraumatic Throat: lips, mucosa, and tongue normal; teeth and gums normal and Blood blisters noted in the oral mucosa. Neck: no adenopathy, no carotid bruit, no JVD, supple, symmetrical, trachea midline and thyroid not enlarged, symmetric, no tenderness/mass/nodules Back: negative Resp: clear to auscultation bilaterally Chest wall: no tenderness Cardio: regular rate and rhythm, S1, S2 normal, no murmur, click, rub or gallop GI: soft, non-tender; bowel sounds normal; no masses,  no organomegaly Extremities: edema left more than the right with erythema noted. Pulses: 2+ and symmetric   Recent Labs  08/05/13 0716 08/05/13 0726  WBC 0.1*  --   HGB 6.6* 6.8*  HCT 19.2* 20.0*  PLT 5*  --     Recent Labs  08/05/13 0716 08/05/13 0726  NA 129* 130*  K 3.3* 3.2*  CL 95* 98  CO2 19  --   GLUCOSE 101* 96  BUN 16 14  CREATININE 1.11 1.20  CALCIUM 8.6  --      Ct Angio Chest Pe W/cm &/or Wo Cm  08/05/2013   CLINICAL DATA:  Hemoptysis; history of lymphoma   EXAM: CT ANGIOGRAPHY CHEST WITH CONTRAST  TECHNIQUE: Multidetector CT imaging of the chest was performed using the standard protocol during bolus administration of intravenous contrast. Multiplanar CT image reconstructions and MIPs were obtained to evaluate the vascular anatomy.  CONTRAST:  136mL OMNIPAQUE IOHEXOL 350 MG/ML SOLN  COMPARISON:  Chest CT June 29, 2013 and chest radiograph August 05, 2013  FINDINGS: There is a small pulmonary embolus in the left superior segment lower lobe pulmonary artery. This finding is best seen on axial series 6 slice 893. No larger pulmonary emboli are apparent. There is no thoracic aortic aneurysm or dissection.  There is patchy airspace consolidation in the left base. There is a degree of underlying emphysematous change.  On axial slice 48, series 7, there is an ill-defined nodular opacity which is new measuring 8 x 6 mm just lateral to the left hilum in the posterior segment of the left upper lobe. There is a calcified granuloma in the posterior segment of the left upper lobe which is stable.  There are small mediastinal lymph nodes but no adenopathy by size criteria. Port-A-Cath tip is in the superior vena cava. Pericardium is not thickened.  In the visualized upper abdomen, there is atherosclerotic change in the aorta as well as calcified granulomas in the liver and spleen. No upper abdominal adenopathy is seen. There are no appreciable blastic or lytic bone lesions. Thyroid appears normal.  Review of the MIP images confirms the above findings.  IMPRESSION: Small pulmonary embolus incompletely obstructing the left lower lobe superior segment pulmonary artery. No other pulmonary emboli seen.  New nodular opacity in the left perihilar region measuring 8 x 6 mm. Given known lymphoma, a followup study in 10-12 weeks may be advised to further assess.  Evidence of prior granulomatous disease.  No adenopathy. Critical Value/emergent results were called by telephone at the time  of interpretation on 08/05/2013 at 8:54 AM to Dr. Jinny Blossom, [facility] , who verbally acknowledged these results.   Electronically Signed   By: Lowella Grip M.D.   On: 08/05/2013 08:55      Assessment and Plan:   80 year old gentleman with the following issues:  1. Relapsed diffuse large cell lymphoma presented with abdominal adenopathy and lower extremity edema. He status post salvage chemotherapy utilizing ICE with rituximab. He received the first cycle of chemotherapy and did well for the most part but now presenting with multiple complications related to that. His plan to have his second cycle next week is a likely that he will receive it on time. Most likely you will receive it with delay and dose reduction.  2. Pancytopenia: This is related to systemic chemotherapy. I agree with packed red cell transfusion to keep his hemoglobin of 8 as well as keep his platelet count above 10,000 unless he is bleeding. He received a Neulasta last week after systemic chemotherapy and unless he is septic with hemodynamic compromise I will not treat him with Neupogen  at this time.  3. Neutropenic sepsis: I agree with the broad spectrum antibiotic treatment he is receiving at this time. I anticipate white cell recovery in the next 24-48 hours.  4. Small pulmonary embolus on his CT scan: His risk of bleeding is very high at this point and I favor holding any anticoagulation for the time being.  We will continue to follow with you during this hospitalization.

## 2013-08-05 NOTE — ED Notes (Signed)
Pt brought to ED by son, pt is chemo patient, last chemo 4 days ago, per caregiver pt has increased weakness this week with sore throat for several days. Pt began coughing up pink tinged sputum @ 2 hours ago. 2 person assist out of car and into bed.

## 2013-08-05 NOTE — ED Notes (Signed)
Pt caregiver states pt had chemo 3 days in a row and received a shot on the 4th day. Pt care giver reports pt began having coughing spell on Monday or Tuesday with a sore throat and began coughing up white mucous which then lead to pt coughing up white mucous with blood in it. Pt is alert and oriented has difficulty answering questions due to excessive coughing.

## 2013-08-05 NOTE — Progress Notes (Signed)
ANTIBIOTIC CONSULT NOTE - INITIAL  Pharmacy Consult for aztreonam, vancomycin, levofloxacin Indication: sepsis  Allergies  Allergen Reactions  . Amoxicillin     REACTION: rash  . Latex Rash  . Penicillins Rash    Patient Measurements:   From 07/22/13: Weight 83.4 kg Height  180 cm  Vital Signs: Temp: 97.3 F (36.3 C) (02/12 0818) Temp src: Oral (02/12 0818) BP: 117/63 mmHg (02/12 0818) Pulse Rate: 105 (02/12 0818) Intake/Output from previous day:   Intake/Output from this shift:    Labs:  Recent Labs  08/05/13 0716 08/05/13 0726  WBC 0.1*  --   HGB 6.6* 6.8*  PLT 5*  --   CREATININE 1.11 1.20   Estimated CrCl approximately 50 mL/min  The CrCl is unknown because both a height and weight (above a minimum accepted value) are required for this calculation. No results found for this basename: VANCOTROUGH, VANCOPEAK, VANCORANDOM, GENTTROUGH, GENTPEAK, GENTRANDOM, TOBRATROUGH, TOBRAPEAK, TOBRARND, AMIKACINPEAK, AMIKACINTROU, AMIKACIN,  in the last 72 hours   Microbiology: No results found for this or any previous visit (from the past 720 hour(s)).  Medical History: Past Medical History  Diagnosis Date  . GERD (gastroesophageal reflux disease)   . Osteoarthritis   . Alcohol abuse     in recovery 2006  . 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 wine only  . Irritable bowel   . Hyperlipidemia   . Insomnia   . PAC (premature atrial contraction)     stable (ruled out for a fib)  . Prostatitis     chronic bacterial  . Pseudogout   . Sinus problem     chronic  . Cellulitis     2nd to T. Pedis(not at present)  . Hearing loss 03-10-12    bilateral hearing aids  . Complication of anesthesia 03-10-12    after shoulder  surgery -difficulty awakening,breathing problems  . Emphysema 03-10-12    tx. inhalers, uses steam room frequently  . Cramping of feet 03-10-12    cramping of both legs and hands occ.  . Non Hodgkin's lymphoma  03-10-12     '07-1 yr. in remission(Shadad)-not seeing now  . Osteoarthritis of left knee 03/17/2012  . Glaucoma   . Macular degeneration   . Hypertension   . Shortness of breath     Medications:  Scheduled:   Infusions:  . aztreonam 2 g (08/05/13 0842)  . levofloxacin (LEVAQUIN) IV    . vancomycin     PRN:   Assessment: 80 y/o M on chemotherapy (R-ICE) for Non-Hodgkin's lymphoma, received first cycle 2/4 - 2/6, was brought to ED with weakness, confusion, cough, sore throat, and found to be pancytopenic.  Empiric antibiotics were ordered for suspected sepsis and pharmacy assistance with dosing was requested.  Goal of Therapy:  Appropriate antibiotic dosing for renal function; eradication of infection Vancomycin trough 15-20  Plan:  1.  Aztreonam 2 grams IV stat, then q8h 2.  Vancomycin 1 gram IV stat, then 750 mg IV q12h 3.  Levofloxacin 750 mg IV stat, then q24h 4.  Follow serum creatinine, cultures, clinical course. 5.  Check vancomycin trough at steady-state.  Clayburn Pert, PharmD, BCPS Pager: [phone] 08/05/2013  8:51 AM    Gifford Ballon, Julieta Bellini 08/05/2013,8:46 AM

## 2013-08-05 NOTE — Progress Notes (Signed)
Patient well known to me and a full note to follow.  I agree with the current management with PRBCs and platelet transfusion. I agree with antibiotics as well.

## 2013-08-05 NOTE — Telephone Encounter (Signed)
Called cell #: Jack [name] is at [facility]. Hgb 6.6 g down from 8.6 07/27/13, WBC 0.1!!!  If admitted will get palliative consult.

## 2013-08-05 NOTE — H&P (Addendum)
History and Physical    ANTWINE AGOSTO VEL:[id number] DOB: 1928-12-09 DOA: 08/05/2013  Referring physician: Dr. Tawnya Crook PCP: Adella Hare, MD  Specialists: Oncology, Palliative  Chief Complaint: Fever  This note has been created with Dragon speech recognition software and smart phrase technology. Any transcriptional errors are unintentional.   HPI: HORACE LUKAS is a 80 y.o. male has a past medical history significant for follicular lymphoma, followed by Dr. Alen Blew, currently undergoing chemotherapy, presents to the emergency room with a chief complaint of fevers, cough, minimal hemoptysis and severe coughing over the past few days. He also endorses left lower extremity swelling and redness; swelling is somewhat chronic, however he feels like it's more erythematous than normal. Denies any chest pain, denies abdominal pain nausea vomiting or diarrhea. Denies insurance of breath. In the emergency room, he is intermittently confused, was found to have a low-grade temperature of 99.6, heart rate 120, was found to have a white count of 0.1, platelets of 5, hemoglobin 6, underwent a CT angiogram of concern for PE given his cough and hemoptysis which did show a small pulmonary embolus. Tricuspid assess for admission for neutropenic fever.  Review of Systems: As per history of present illness, otherwise negative  Past Medical History  Diagnosis Date  . GERD (gastroesophageal reflux disease)   . Osteoarthritis   . Alcohol abuse     in recovery 2006  . 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 wine only  . Irritable bowel   . Hyperlipidemia   . Insomnia   . PAC (premature atrial contraction)     stable (ruled out for a fib)  . Prostatitis     chronic bacterial  . Pseudogout   . Sinus problem     chronic  . Cellulitis     2nd to T. Pedis(not at present)  . Hearing loss 03-10-12    bilateral hearing aids  . Complication of anesthesia 03-10-12    after  shoulder  surgery -difficulty awakening,breathing problems  . Emphysema 03-10-12    tx. inhalers, uses steam room frequently  . Cramping of feet 03-10-12    cramping of both legs and hands occ.  . Osteoarthritis of left knee 03/17/2012  . Glaucoma   . Macular degeneration   . Hypertension   . Shortness of breath   . Non Hodgkin's lymphoma 03-10-12     '07-1 yr. in remission([facility])-not seeing now   Past Surgical History  Procedure Laterality Date  . Esophagogastroduodenoscopy  01-27-02  . Inguinal hernia repair  1979  . Torn biceps repair  03-10-12    Left rotator cuff repair  . Hernia repair    . Cataract surgery  03-10-12    03-04-12(right)/ (03-11-12-left)  . Total knee arthroplasty  03/17/2012    Procedure: TOTAL KNEE ARTHROPLASTY;  Surgeon: Johnny Bridge, MD;  Location: [facility];  Service: Orthopedics;  Laterality: Left;  Marland Kitchen Eye surgery  2014    cataract extraction with IOL both eyes staged  . Colonoscopy    . Rotator cuff repair Left   . Total knee arthroplasty Right 03/22/2013    Procedure: TOTAL KNEE ARTHROPLASTY;  Surgeon: Johnny Bridge, MD;  Location: [place];  Service: Orthopedics;  Laterality: Right;  righ total knee arthroplasty   Social History:  reports that he quit smoking about 35 years ago. His smoking use included Cigarettes. He smoked 0.00 packs per day. He has never used smokeless tobacco. He reports that he  drinks about 6.7 ounces of alcohol per week. He reports that he does not use illicit drugs.  Allergies  Allergen Reactions  . Amoxicillin     REACTION: rash  . Latex Rash  . Penicillins Rash    Family History  Problem Relation Age of Onset  . Coronary artery disease Father   . Heart attack Father   . Cancer Brother     colon  . Other Other     TB   Prior to Admission medications   Medication Sig Start Date End Date Taking? Authorizing Provider  albuterol (PROVENTIL HFA;VENTOLIN HFA) 108 (90 BASE) MCG/ACT inhaler Inhale 2 puffs into the lungs every 6  (six) hours as needed for shortness of breath.    Yes Historical Provider, MD  alfuzosin (UROXATRAL) 10 MG 24 hr tablet Take 10 mg by mouth every morning.    Yes Historical Provider, MD  allopurinol (ZYLOPRIM) 100 MG tablet Take 1 tablet (100 mg total) by mouth daily. 07/12/13  Yes Neena Rhymes, MD  Alum & Mag Hydroxide-Simeth (MAGIC MOUTHWASH) SOLN Take 5 mLs by mouth 4 (four) times daily. 08/03/13  Yes Maryanna Shape, NP  aspirin EC 81 MG tablet Take 81 mg by mouth every evening.    Yes Historical Provider, MD  dextromethorphan (DELSYM) 30 MG/5ML liquid Take 60 mg by mouth 2 (two) times daily.   Yes Historical Provider, MD  esomeprazole (NEXIUM) 40 MG capsule Take 40 mg by mouth daily before breakfast.   Yes Historical Provider, MD  famciclovir (FAMVIR) 500 MG tablet Take 250 mg by mouth daily. 05/08/13  Yes Neena Rhymes, MD  Fluticasone-Salmeterol (ADVAIR) 500-50 MCG/DOSE AEPB Inhale 1 puff into the lungs 2 (two) times daily. 12/31/12  Yes Neena Rhymes, MD  hyoscyamine (LEVSIN SL) 0.125 MG SL tablet dissolve 1 tablet under the tongue every 2 hours if needed for ABDOMINAL CRAMPS 06/25/13  Yes Neena Rhymes, MD  lidocaine-prilocaine (EMLA) cream Apply 1 application topically as needed. Apply to port 1-2 hours before chemotherapy. 08/04/13  Yes Wyatt Portela, MD  magnesium citrate SOLN Take 1 Bottle by mouth once.   Yes Historical Provider, MD  magnesium oxide (MAG-OX) 400 MG tablet Take 400 mg by mouth daily.   Yes Historical Provider, MD  Menthol, Topical Analgesic, 10 % LIQD Apply 1 application topically 3 (three) times daily as needed (pain).   Yes Historical Provider, MD  metoprolol succinate (TOPROL-XL) 25 MG 24 hr tablet Take 25 mg by mouth daily.   Yes Historical Provider, MD  Multiple Vitamins-Minerals (PRESERVISION AREDS PO) Take 1 tablet by mouth 2 (two) times daily.   Yes Historical Provider, MD  oxyCODONE (OXY IR/ROXICODONE) 5 MG immediate release tablet Take one to two  tablets every four hours as needed. 07/27/13  Yes Wyatt Portela, MD  OxyCODONE (OXYCONTIN) 20 mg T12A 12 hr tablet Take 1 tablet (20 mg total) by mouth every 12 (twelve) hours. 07/27/13  Yes Wyatt Portela, MD  potassium chloride SA (K-DUR,KLOR-CON) 20 MEQ tablet take 1 tablet by mouth once daily 06/13/13  Yes Neena Rhymes, MD  prochlorperazine (COMPAZINE) 10 MG tablet Take 1 tablet (10 mg total) by mouth every 6 (six) hours as needed for nausea or vomiting. 06/04/13  Yes Wyatt Portela, MD  quiNINE (QUALAQUIN) 324 MG capsule Take 324 mg by mouth 2 (two) times daily.   Yes Historical Provider, MD  senna-docusate (SENOKOT-S) 8.6-50 MG per tablet Take 1 tablet by mouth 2 (two)  times daily. 07/01/13  Yes Wyatt Portela, MD  simvastatin (ZOCOR) 20 MG tablet Take 20 mg by mouth every evening.   Yes Historical Provider, MD  traZODone (DESYREL) 50 MG tablet Take 1 tablet (50 mg total) by mouth at bedtime as needed for sleep. 05/27/13  Yes Neena Rhymes, MD   Physical Exam: Filed Vitals:   08/05/13 1515 08/05/13 1525 08/05/13 1600 08/05/13 1604  BP: 120/96 127/70  127/65  Pulse: 114 110  104  Temp:   98.1 F (36.7 C)   TempSrc:   Oral   Resp: 20 24  20   Height:    5\' 11"  (1.803 m)  Weight:    91.1 kg (200 lb 13.4 oz)  SpO2: 99% 98%  99%     General:  No apparent distress, alert and oriented x4 for me  Eyes: PERRL, EOMI, no scleral icterus  ENT: moist oropharynx  Neck: supple, no JVD  Cardiovascular: regular rate without MRG; 2+ peripheral pulses  Respiratory: CTA biL, good air movement without wheezing, rhonchi or crackled  Abdomen: soft, non tender to palpation, positive bowel sounds, no guarding, no rebound  Skin: no rashes;cellulitic changes of the anterior surface of the left lower extremity  Musculoskeletal: 1+ peripheral edema  Psychiatric: normal mood and affect  Neurologic: non focal  Labs on Admission:  Basic Metabolic Panel:  Recent Labs Lab 08/05/13 0716  08/05/13 0726  NA 129* 130*  K 3.3* 3.2*  CL 95* 98  CO2 19  --   GLUCOSE 101* 96  BUN 16 14  CREATININE 1.11 1.20  CALCIUM 8.6  --    Liver Function Tests:  Recent Labs Lab 08/05/13 0716  AST 22  ALT 11  ALKPHOS 67  BILITOT 0.5  PROT 5.7*  ALBUMIN 2.7*   CBC:  Recent Labs Lab 08/05/13 0716 08/05/13 0726  WBC 0.1*  --   HGB 6.6* 6.8*  HCT 19.2* 20.0*  MCV 79.7  --   PLT 5*  --    Radiological Exams on Admission: Ct Angio Chest Pe W/cm &/or Wo Cm  08/05/2013   CLINICAL DATA:  Hemoptysis; history of lymphoma  EXAM: CT ANGIOGRAPHY CHEST WITH CONTRAST  TECHNIQUE: Multidetector CT imaging of the chest was performed using the standard protocol during bolus administration of intravenous contrast. Multiplanar CT image reconstructions and MIPs were obtained to evaluate the vascular anatomy.  CONTRAST:  119mL OMNIPAQUE IOHEXOL 350 MG/ML SOLN  COMPARISON:  Chest CT June 29, 2013 and chest radiograph August 05, 2013  FINDINGS: There is a small pulmonary embolus in the left superior segment lower lobe pulmonary artery. This finding is best seen on axial series 6 slice 123XX123. No larger pulmonary emboli are apparent. There is no thoracic aortic aneurysm or dissection.  There is patchy airspace consolidation in the left base. There is a degree of underlying emphysematous change.  On axial slice 48, series 7, there is an ill-defined nodular opacity which is new measuring 8 x 6 mm just lateral to the left hilum in the posterior segment of the left upper lobe. There is a calcified granuloma in the posterior segment of the left upper lobe which is stable.  There are small mediastinal lymph nodes but no adenopathy by size criteria. Port-A-Cath tip is in the superior vena cava. Pericardium is not thickened.  In the visualized upper abdomen, there is atherosclerotic change in the aorta as well as calcified granulomas in the liver and spleen. No upper abdominal adenopathy is seen. There  are no  appreciable blastic or lytic bone lesions. Thyroid appears normal.  Review of the MIP images confirms the above findings.  IMPRESSION: Small pulmonary embolus incompletely obstructing the left lower lobe superior segment pulmonary artery. No other pulmonary emboli seen.  New nodular opacity in the left perihilar region measuring 8 x 6 mm. Given known lymphoma, a followup study in 10-12 weeks may be advised to further assess.  Evidence of prior granulomatous disease.  No adenopathy. Critical Value/emergent results were called by telephone at the time of interpretation on 08/05/2013 at 8:54 AM to Dr. Jinny Blossom, [facility] , who verbally acknowledged these results.   Electronically Signed   By: Lowella Grip M.D.   On: 08/05/2013 08:55   Dg Chest Portable 1 View  08/05/2013   CLINICAL DATA:  Lymphoma.  Spitting up blood.  EXAM: PORTABLE CHEST - 1 VIEW  COMPARISON:  CT chest, abdomen and pelvis 06/29/2013 and PA and lateral chest 06/01/2013.  FINDINGS: Port-A-Cath is in place. Punctate calcified granuloma left upper lobe is identified. The lungs are otherwise clear. Heart size is normal. No pneumothorax or pleural effusion.  IMPRESSION: No acute disease.   Electronically Signed   By: Inge Rise M.D.   On: 08/05/2013 07:40    EKG: Independently reviewed.  Assessment/Plan Principal Problem:   Neutropenic fever Active Problems:   LYMPHOMA   HYPERLIPIDEMIA   SIRS (systemic inflammatory response syndrome)   Sepsis   Anemia   Leukopenia   Antineoplastic chemotherapy induced pancytopenia   Thrombocytopenia, unspecified   Hemoptysis   Cellulitis   Acute pulmonary embolism   Sepsis in the setting of Neutropenic fever - cover broad-spectrum antibiotics fine, strand, levofloxacin. No overt evidence of pneumonia. Left lower extremity with possible cellulitis given erythematous changes and swelling. Port site looks intact without any surrounding erythema or tenderness. PE - noted on the C PA, embolus a  small unlikely to have significant hemodynamic impact at this point. Unfortunately cannot anticoagulate given hemoptysis, thrombocytopenia and severe anemia. Left lower extremity cellulitis - antibiotics as above Lymphoma - oncology following, appreciate input.  Recent chemotherapy HTN - hold metoprolol tonight in the setting of sepsis.  Anemia - transfuse as needed to keep hemoglobin above 7. Due to #5 Thrombocytopenia - transfuse to keep platelets above 10. Due to #5  Diet: Clear liquids, advance as tolerated Fluids: Normal saline DVT Prophylaxis: SCDs  Code Status: Full code  Family Communication: Daughter is at bedside  Disposition Plan: Inpatient  Time spent: 31  Myan Suit M. Cruzita Lederer, MD [facility] Pager [phone]  If 7PM-7AM, please contact night-coverage [email or web address] Password [facility] 08/05/2013, 4:49 PM

## 2013-08-05 NOTE — Progress Notes (Signed)
Patient received from the ER; platelets already infusing.  Documentation does not necessarilly reflect, start and 40min reassessment.  Arrived to ICU/SD approx at 1545, a set of VS were taken and documented and then sequentially.  Patient tolerating infusion of platelets without incident. CBC and BMET ordered for 2/13 at 5am per MD.

## 2013-08-06 ENCOUNTER — Inpatient Hospital Stay (HOSPITAL_COMMUNITY): Payer: Medicare Other

## 2013-08-06 DIAGNOSIS — D649 Anemia, unspecified: Secondary | ICD-10-CM

## 2013-08-06 DIAGNOSIS — T451X5A Adverse effect of antineoplastic and immunosuppressive drugs, initial encounter: Secondary | ICD-10-CM

## 2013-08-06 DIAGNOSIS — I2699 Other pulmonary embolism without acute cor pulmonale: Secondary | ICD-10-CM

## 2013-08-06 DIAGNOSIS — R042 Hemoptysis: Secondary | ICD-10-CM

## 2013-08-06 DIAGNOSIS — A419 Sepsis, unspecified organism: Secondary | ICD-10-CM

## 2013-08-06 DIAGNOSIS — D61818 Other pancytopenia: Secondary | ICD-10-CM

## 2013-08-06 DIAGNOSIS — D61810 Antineoplastic chemotherapy induced pancytopenia: Secondary | ICD-10-CM

## 2013-08-06 DIAGNOSIS — R6510 Systemic inflammatory response syndrome (SIRS) of non-infectious origin without acute organ dysfunction: Secondary | ICD-10-CM

## 2013-08-06 DIAGNOSIS — D696 Thrombocytopenia, unspecified: Secondary | ICD-10-CM

## 2013-08-06 DIAGNOSIS — R5081 Fever presenting with conditions classified elsewhere: Secondary | ICD-10-CM

## 2013-08-06 LAB — PREPARE RBC (CROSSMATCH)

## 2013-08-06 LAB — COMPREHENSIVE METABOLIC PANEL
ALBUMIN: 2.4 g/dL — AB (ref 3.5–5.2)
ALK PHOS: 68 U/L (ref 39–117)
ALT: 15 U/L (ref 0–53)
AST: 32 U/L (ref 0–37)
BILIRUBIN TOTAL: 0.5 mg/dL (ref 0.3–1.2)
BUN: 14 mg/dL (ref 6–23)
CHLORIDE: 97 meq/L (ref 96–112)
CO2: 19 mEq/L (ref 19–32)
Calcium: 8.5 mg/dL (ref 8.4–10.5)
Creatinine, Ser: 0.96 mg/dL (ref 0.50–1.35)
GFR calc Af Amer: 87 mL/min — ABNORMAL LOW (ref >90)
GFR calc non Af Amer: 75 mL/min — ABNORMAL LOW (ref >90)
Glucose, Bld: 85 mg/dL (ref 70–99)
POTASSIUM: 3 meq/L — AB (ref 3.7–5.3)
Sodium: 132 mEq/L — ABNORMAL LOW (ref 137–147)
Total Protein: 5.5 g/dL — ABNORMAL LOW (ref 6.0–8.3)

## 2013-08-06 LAB — CBC WITH DIFFERENTIAL/PLATELET
Basophils Absolute: 0 10*3/uL (ref 0.0–0.1)
Basophils Relative: 0 % (ref 0–1)
EOS PCT: 0 % (ref 0–5)
Eosinophils Absolute: 0 10*3/uL (ref 0.0–0.7)
HCT: 19.8 % — ABNORMAL LOW (ref 39.0–52.0)
Hemoglobin: 6.9 g/dL — CL (ref 13.0–17.0)
Lymphocytes Relative: 15 % (ref 12–46)
Lymphs Abs: 0 10*3/uL — ABNORMAL LOW (ref 0.7–4.0)
MCH: 27.7 pg (ref 26.0–34.0)
MCHC: 34.8 g/dL (ref 30.0–36.0)
MCV: 79.5 fL (ref 78.0–100.0)
MONOS PCT: 41 % — AB (ref 3–12)
Monocytes Absolute: 0.1 10*3/uL (ref 0.1–1.0)
NEUTROS PCT: 44 % (ref 43–77)
Neutro Abs: 0.2 10*3/uL — ABNORMAL LOW (ref 1.7–7.7)
PLATELETS: 10 10*3/uL — AB (ref 150–400)
RBC: 2.49 MIL/uL — AB (ref 4.22–5.81)
RDW: 15.3 % (ref 11.5–15.5)
WBC: 0.3 10*3/uL — AB (ref 4.0–10.5)

## 2013-08-06 LAB — PREPARE PLATELET PHERESIS: Unit division: 0

## 2013-08-06 LAB — PHOSPHORUS: PHOSPHORUS: 1.6 mg/dL — AB (ref 2.3–4.6)

## 2013-08-06 LAB — PROTIME-INR
INR: 1.31 (ref 0.00–1.49)
Prothrombin Time: 16 seconds — ABNORMAL HIGH (ref 11.6–15.2)

## 2013-08-06 LAB — MAGNESIUM: Magnesium: 1.9 mg/dL (ref 1.5–2.5)

## 2013-08-06 MED ORDER — MAGIC MOUTHWASH
5.0000 mL | Freq: Four times a day (QID) | ORAL | Status: DC
  Administered 2013-08-06 – 2013-08-16 (×32): 5 mL via ORAL
  Filled 2013-08-06 (×46): qty 5

## 2013-08-06 MED ORDER — PANTOPRAZOLE SODIUM 40 MG PO TBEC
40.0000 mg | DELAYED_RELEASE_TABLET | Freq: Every day | ORAL | Status: DC
  Administered 2013-08-06 – 2013-08-09 (×4): 40 mg via ORAL
  Filled 2013-08-06 (×4): qty 1

## 2013-08-06 MED ORDER — BOOST / RESOURCE BREEZE PO LIQD
1.0000 | Freq: Three times a day (TID) | ORAL | Status: DC
  Administered 2013-08-06 – 2013-08-10 (×7): 1 via ORAL

## 2013-08-06 MED ORDER — ALPRAZOLAM 1 MG PO TABS
1.0000 mg | ORAL_TABLET | Freq: Every day | ORAL | Status: DC
  Administered 2013-08-06: 1 mg via ORAL
  Filled 2013-08-06: qty 1

## 2013-08-06 MED ORDER — ALPRAZOLAM 0.5 MG PO TABS: 0.5000 mg | ORAL_TABLET | Freq: Three times a day (TID) | ORAL | Status: DC | PRN

## 2013-08-06 MED ORDER — FLUCONAZOLE IN SODIUM CHLORIDE 200-0.9 MG/100ML-% IV SOLN
200.0000 mg | Freq: Once | INTRAVENOUS | Status: AC
  Administered 2013-08-06: 200 mg via INTRAVENOUS
  Filled 2013-08-06: qty 100

## 2013-08-06 MED ORDER — FLUCONAZOLE 100MG IVPB
100.0000 mg | INTRAVENOUS | Status: DC
  Administered 2013-08-07 – 2013-08-11 (×5): 100 mg via INTRAVENOUS
  Filled 2013-08-06 (×8): qty 50

## 2013-08-06 MED ORDER — ALBUTEROL SULFATE (2.5 MG/3ML) 0.083% IN NEBU: 3.0000 mL | INHALATION_SOLUTION | Freq: Four times a day (QID) | RESPIRATORY_TRACT | Status: DC | PRN

## 2013-08-06 MED ORDER — ADULT MULTIVITAMIN W/MINERALS CH
1.0000 | ORAL_TABLET | Freq: Every day | ORAL | Status: DC
  Administered 2013-08-06 – 2013-08-09 (×4): 1 via ORAL
  Filled 2013-08-06 (×5): qty 1

## 2013-08-06 MED ORDER — IPRATROPIUM-ALBUTEROL 0.5-2.5 (3) MG/3ML IN SOLN
3.0000 mL | Freq: Four times a day (QID) | RESPIRATORY_TRACT | Status: DC
  Administered 2013-08-06 – 2013-08-08 (×9): 3 mL via RESPIRATORY_TRACT
  Filled 2013-08-06 (×9): qty 3

## 2013-08-06 MED ORDER — SENNOSIDES-DOCUSATE SODIUM 8.6-50 MG PO TABS
1.0000 | ORAL_TABLET | Freq: Two times a day (BID) | ORAL | Status: DC
  Administered 2013-08-06 – 2013-08-09 (×7): 1 via ORAL
  Filled 2013-08-06 (×10): qty 1

## 2013-08-06 MED ORDER — POTASSIUM CHLORIDE CRYS ER 20 MEQ PO TBCR
40.0000 meq | EXTENDED_RELEASE_TABLET | Freq: Once | ORAL | Status: AC
  Administered 2013-08-06: 40 meq via ORAL
  Filled 2013-08-06: qty 2

## 2013-08-06 MED ORDER — SIMVASTATIN 20 MG PO TABS
20.0000 mg | ORAL_TABLET | Freq: Every evening | ORAL | Status: DC
  Administered 2013-08-06 – 2013-08-07 (×2): 20 mg via ORAL
  Filled 2013-08-06 (×4): qty 1

## 2013-08-06 MED ORDER — IPRATROPIUM-ALBUTEROL 0.5-2.5 (3) MG/3ML IN SOLN
3.0000 mL | RESPIRATORY_TRACT | Status: DC
  Administered 2013-08-06 (×2): 3 mL via RESPIRATORY_TRACT
  Filled 2013-08-06 (×3): qty 3

## 2013-08-06 NOTE — Progress Notes (Signed)
[facility] PROGRESS NOTE  Jack [name] ZOX:[id number] DOB: 1929-03-26 DOA: 08/05/2013 PCP: Adella Hare, MD  Assessment/Plan: 1. Sepsis, present on admission, evidenced by white count of 0.1, heart rate of 121, with presence of metabolic encephalopathy. Suspect source of infection to be in his lungs given complaints of cough, sputum production and fevers. We'll continue broad-spectrum IV antibiotic therapy with vancomycin, aztreonam, and Levaquin. Blood cultures overnight remaining sterile. Continue supportive care, transfuse blood and platelets today. 2. Neutropenic fever. Patient with history of follicular lymphoma, undergoing chemotherapy at the [facility], presented with a white count of 0.1, he remains on broad-spectrum IV antibiotic therapy, follow cultures, provide supportive care. 3. Pancytopenia secondary to systemic chemotherapy. We'll transfuse 2 units packed red blood cells along with 2 units of platelets today. A.m. lab work showing a hemoglobin of 6.9, hematocrit of 19.8 and platelet count of 10,000. 4. Pulmonary embolism. He had a CT scan of lungs with contrast yesterday that showed a small pulmonary embolus and completely obstructing the left lower lobe superior segment pulmonary artery. He is hemodynamically stable, not an anticoagulation candidate given significant anemia and thrombocytopenia in setting of chemotherapy. Risks of anticoagulation outweigh benefits at this time. 5. Hypokalemic. Patient having a potassium 3.0, will provide test replacement therapy today. 6. Hyponatremia. Lab work showing a sodium of 132, suspect secondary to hypovolemia this patient endorses minimal by mouth intake. Receiving IV fluid resuscitation 7. Stage II follicular lymphoma. Patient had been on salvage chemotherapy at the [facility] with ifosphamide, carboplatin, etoposide, and rituxan. Care consulted for goals of care. After further discussion over night regarding CODE STATUS with his  daughters at bedside, patient made a DO NOT RESUSCITATE per his wishes.   Code Status: DNR Family Communication: I spoke with his daughters present at bedside Disposition Plan: Continue close monitoring in Step Down.    Consultants:  Medical Oncology  Antibiotics:  Vancomycin (started on 08/05/2013)  Aztreonam (started on 08/05/2013)  Levaquin (started on 08/05/2013)  HPI/Subjective: Patient is a pleasant 80 year old man with a past medical history of stage II follicular lymphoma that was diagnosed in 2007, currently undergoing salvage chemotherapy with ifosfamide, carboplatin, etoposide and Rituxan at the [facility]. He presented to the emergency room on 08/05/2013 with complaints of cough, fevers, sputum production as well as minimal amount of hemoptysis. Symptoms had worsened over the past 2-3 days. Initial lab work in the emergency room showed a white count of 0.1 with hemoglobin of 6.6 and platelet count of 5. Patient was seen and evaluated by his medical oncologist Dr Alen Blew. He was transfused with platelets overnight. He was also started on broad-spectrum IV antibiotic therapy with vancomycin, aztreonam and Levaquin. This morning he reports feeling about the same with ongoing cough, sputum production, has not had further episodes of hemoptysis.   Objective: Filed Vitals:   08/06/13 0400  BP: 123/56  Pulse: 91  Temp: 97.2 F (36.2 C)  Resp: 20    Intake/Output Summary (Last 24 hours) at 08/06/13 0741 Last data filed at 08/06/13 0700  Gross per 24 hour  Intake   2600 ml  Output    825 ml  Net   1775 ml   Filed Weights   08/05/13 1604 08/06/13 0400  Weight: 91.1 kg (200 lb 13.4 oz) 82.5 kg (181 lb 14.1 oz)    Exam:   General:  Patient is ill-appearing, though does not appear to be in acute distress, he is awake alert and oriented  Cardiovascular: Tachycardic, regular rate  rhythm normal S1-S2  Respiratory: Positive bilateral rhonchi, expiratory wheezing,  crackles. Mildly tachypnea, room air currently satting mid 90s.  Abdomen: Soft, nontender, nondistended  Musculoskeletal: Left lower extremity edema noted, mild localized erythema  Data Reviewed: Basic Metabolic Panel:  Recent Labs Lab 08/05/13 0716 08/05/13 0726 08/06/13 0526  NA 129* 130* 132*  K 3.3* 3.2* 3.0*  CL 95* 98 97  CO2 19  --  19  GLUCOSE 101* 96 85  BUN 16 14 14   CREATININE 1.11 1.20 0.96  CALCIUM 8.6  --  8.5  MG  --   --  1.9  PHOS  --   --  1.6*   Liver Function Tests:  Recent Labs Lab 08/05/13 0716 08/06/13 0526  AST 22 32  ALT 11 15  ALKPHOS 67 68  BILITOT 0.5 0.5  PROT 5.7* 5.5*  ALBUMIN 2.7* 2.4*   No results found for this basename: LIPASE, AMYLASE,  in the last 168 hours No results found for this basename: AMMONIA,  in the last 168 hours CBC:  Recent Labs Lab 08/05/13 0716 08/05/13 0726 08/06/13 0526  WBC 0.1*  --  0.3*  NEUTROABS  --   --  0.2*  HGB 6.6* 6.8* 6.9*  HCT 19.2* 20.0* 19.8*  MCV 79.7  --  79.5  PLT 5*  --  10*   Cardiac Enzymes: No results found for this basename: CKTOTAL, CKMB, CKMBINDEX, TROPONINI,  in the last 168 hours BNP (last 3 results) No results found for this basename: PROBNP,  in the last 8760 hours CBG: No results found for this basename: GLUCAP,  in the last 168 hours  Recent Results (from the past 240 hour(s))  MRSA PCR SCREENING     Status: Abnormal   Collection Time    08/05/13  4:14 PM      Result Value Ref Range Status   MRSA by PCR POSITIVE (*) NEGATIVE Final   Comment:            The GeneXpert MRSA Assay (FDA     approved for NASAL specimens     only), is one component of a     comprehensive MRSA colonization     surveillance program. It is not     intended to diagnose MRSA     infection nor to guide or     monitor treatment for     MRSA infections.     RESULT CALLED TO, READ BACK BY AND VERIFIED WITH:     Marlou Sa RN [id number] 08/05/13 A NAVARRO     Studies: Ct Angio Chest Pe  W/cm &/or Wo Cm  08/05/2013   CLINICAL DATA:  Hemoptysis; history of lymphoma  EXAM: CT ANGIOGRAPHY CHEST WITH CONTRAST  TECHNIQUE: Multidetector CT imaging of the chest was performed using the standard protocol during bolus administration of intravenous contrast. Multiplanar CT image reconstructions and MIPs were obtained to evaluate the vascular anatomy.  CONTRAST:  123mL OMNIPAQUE IOHEXOL 350 MG/ML SOLN  COMPARISON:  Chest CT June 29, 2013 and chest radiograph August 05, 2013  FINDINGS: There is a small pulmonary embolus in the left superior segment lower lobe pulmonary artery. This finding is best seen on axial series 6 slice 123XX123. No larger pulmonary emboli are apparent. There is no thoracic aortic aneurysm or dissection.  There is patchy airspace consolidation in the left base. There is a degree of underlying emphysematous change.  On axial slice 48, series 7, there is an ill-defined nodular opacity which is new  measuring 8 x 6 mm just lateral to the left hilum in the posterior segment of the left upper lobe. There is a calcified granuloma in the posterior segment of the left upper lobe which is stable.  There are small mediastinal lymph nodes but no adenopathy by size criteria. Port-A-Cath tip is in the superior vena cava. Pericardium is not thickened.  In the visualized upper abdomen, there is atherosclerotic change in the aorta as well as calcified granulomas in the liver and spleen. No upper abdominal adenopathy is seen. There are no appreciable blastic or lytic bone lesions. Thyroid appears normal.  Review of the MIP images confirms the above findings.  IMPRESSION: Small pulmonary embolus incompletely obstructing the left lower lobe superior segment pulmonary artery. No other pulmonary emboli seen.  New nodular opacity in the left perihilar region measuring 8 x 6 mm. Given known lymphoma, a followup study in 10-12 weeks may be advised to further assess.  Evidence of prior granulomatous disease.  No  adenopathy. Critical Value/emergent results were called by telephone at the time of interpretation on 08/05/2013 at 8:54 AM to Dr. Jinny Blossom, [facility] , who verbally acknowledged these results.   Electronically Signed   By: Lowella Grip M.D.   On: 08/05/2013 08:55   Dg Chest Portable 1 View  08/05/2013   CLINICAL DATA:  Lymphoma.  Spitting up blood.  EXAM: PORTABLE CHEST - 1 VIEW  COMPARISON:  CT chest, abdomen and pelvis 06/29/2013 and PA and lateral chest 06/01/2013.  FINDINGS: Port-A-Cath is in place. Punctate calcified granuloma left upper lobe is identified. The lungs are otherwise clear. Heart size is normal. No pneumothorax or pleural effusion.  IMPRESSION: No acute disease.   Electronically Signed   By: Inge Rise M.D.   On: 08/05/2013 07:40    Scheduled Meds: . aztreonam  2 g Intravenous 3 times per day  . Chlorhexidine Gluconate Cloth  6 each Topical Q0600  . ipratropium-albuterol  3 mL Nebulization Q4H  . levofloxacin (LEVAQUIN) IV  750 mg Intravenous Q24H  . magic mouthwash  5 mL Oral QID  . mupirocin ointment  1 application Nasal BID  . OxyCODONE  20 mg Oral Q12H  . pantoprazole  40 mg Oral Daily  . potassium chloride  40 mEq Oral Once  . senna-docusate  1 tablet Oral BID  . simvastatin  20 mg Oral QPM  . vancomycin  750 mg Intravenous Q12H   Continuous Infusions: . sodium chloride 50 mL/hr at 08/05/13 1710    Principal Problem:   Neutropenic fever Active Problems:   LYMPHOMA   HYPERLIPIDEMIA   SIRS (systemic inflammatory response syndrome)   Sepsis   Anemia   Leukopenia   Antineoplastic chemotherapy induced pancytopenia   Thrombocytopenia, unspecified   Hemoptysis   Cellulitis   Acute pulmonary embolism    Time spent: 45 minutes    Kelvin Cellar  [facility] Pager [phone]. If 7PM-7AM, please contact night-coverage at [email or web address], password [facility] 08/06/2013, 7:41 AM  LOS: 1 day

## 2013-08-06 NOTE — Progress Notes (Signed)
Pt's daughter [name], at beside with the pt and states that the pt does not want to be resuscitated in the event that his heart stops. Pt alert and oriented at this time and verbally states that he does not want to be resuscitated  if his heart were to stop. Notified K. Schorr,NP of pt's request and DNR order obtained. Explained to pt and daughter that DNR was now in place. Pt and daughter verbalize understanding and agreement with the current order.

## 2013-08-06 NOTE — Consult Note (Signed)
Palliative Medicine Team Consult Note Requested by: Dr. Letta Median, Dr. 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   Participants: Patient and his three children. His three children were at bedside providing support and seeking clarity and information regarding their fathers prognosis and current condition.  Assessment: Mr. Flanagin is currently in the intensive care unit being aggressively managed with blood and platelet transfusions, he has a Hb of 6.9 and platelets of 10. He is intermittently confused and appears encephalopathic he is quite agitated at the time of my visit. He appears critically ill. I explained to family that I am only seeing a snapshot of his condition and acute illness-they tell me prior to chemo he was doing very well-active and independent. His prognosis is very poor especially in light of this serious complication of chemotherapy. Unclear if additional treatments will be offered or if that would be reasonable- still need to to see how his current condition will evolve-I prepared them for a variety of scenarios including hospital death, stabilization in a debilitated state, or improvement with some recovery and possible re-exploration of cancer treatment.  Goals:  1. DNR, DNR established prior to my visit and he specifically requested that I not talk about any tubes or machines-which I had indeed not planned to re-address. 2. Mr. Mittleman in his delirium stated- "I dont want to died in damn [facility]-I want to die at home-but it may be too late for that" he then says "just go with this for now".  3. I advanced his diet for comfort 4. I will allow for his family bring him a glass of wine per their request when his  throat is better and he is more stable. 5. I have ordered alprazolam at bedtime- he is used to having several glasses of wine every night which may be why he is agitated this afternoon. 6. He denies pain but his leg is very swollen and red- I suspect that is the course of the PE.  For now will provide symptom management and comfort simultaneously with aggressive treatment. I introduced the concept of Hospice, he was having difficulty coping with the prognostication and we agreed to take one day at a time for now-I am however not advocating that we not begin preparation for will likely be a bad outcome-his family says QOL and independence is his most valued state and to lose that would be devastating for him.  I will follow over the weekend.  Lane Hacker, DO Palliative Medicine

## 2013-08-06 NOTE — Progress Notes (Signed)
INITIAL NUTRITION ASSESSMENT  Pt meets criteria for severe MALNUTRITION in the context of chronic illness as evidenced by <75% estimated energy intake in the past month with 7.6% weight loss in the past 3 months.  DOCUMENTATION CODES Per approved criteria  -Severe malnutrition in the context of chronic illness   INTERVENTION: - Recommend MD replace pt's low phosphorus - Multivitamin 1 tablet PO daily - Resource Breeze TID - Diet advancement per MD - Will continue to monitor  NUTRITION DIAGNOSIS: Inadequate oral intake related to clear liquid diet as evidenced by diet order.    Goal: Advance diet as tolerated to regular diet  Monitor:  Weights, labs, diet advancement  Reason for Assessment: Malnutrition screening tool   80 y.o. male  Admitting Dx: Neutropenic fever  ASSESSMENT: Pt with past medical history significant for follicular lymphoma, followed by Dr. Alen Blew, currently undergoing chemotherapy, presents to the emergency room with a chief complaint of fevers, cough, minimal hemoptysis and severe coughing over the past few days. He also endorses left lower extremity swelling and redness; swelling is somewhat chronic, however he feels like it's more erythematous than normal. Denies any chest pain, denies abdominal pain nausea vomiting or diarrhea. Denies insurance of breath. In the emergency room, he was intermittently confused, was found to have a low-grade temperature and underwent a CT angiogram of concern for PE given his cough and hemoptysis which did show a small pulmonary embolus per MD notes.   Pt known to RD from admission in November 2014. Met with pt and family who report pt has had a poor appetite in the past week with pt consuming nothing at all for the past 2 days. Was drinking Boost breeze at home that family would make into a smoothie. Pt's weight down 15 pounds unintentionally since December 2014. Pt reports eating some of clear liquid breakfast today. Palliative  care following.   Potassium low getting oral replacement Phosphorus low   Height: Ht Readings from Last 1 Encounters:  08/05/13 $RemoveB'5\' 11"'[id number]$  (1.803 m)    Weight: Wt Readings from Last 1 Encounters:  08/06/13 181 lb 14.1 oz (82.5 kg)    Ideal Body Weight: 172 lb  % Ideal Body Weight: 105%  Wt Readings from Last 10 Encounters:  08/06/13 181 lb 14.1 oz (82.5 kg)  07/22/13 183 lb 12.8 oz (83.371 kg)  07/09/13 180 lb (81.647 kg)  07/01/13 185 lb 3.2 oz (84.006 kg)  06/03/13 196 lb 1.6 oz (88.95 kg)  06/01/13 190 lb (86.183 kg)  05/18/13 186 lb 3.2 oz (84.46 kg)  05/07/13 195 lb 1.7 oz (88.5 kg)  04/27/13 193 lb (87.544 kg)  04/22/13 189 lb 14.4 oz (86.138 kg)    Usual Body Weight: 196 lb in October 2014 per family  % Usual Body Weight: 92%  BMI:  Body mass index is 25.38 kg/(m^2).  Estimated Nutritional Needs: Kcal: 2050-2250 Protein: 100-110g Fluid: 2-2.2L/day  Skin: +3 RLE, LLE edema  Diet Order: Clear Liquid  EDUCATION NEEDS: -No education needs identified at this time   Intake/Output Summary (Last 24 hours) at 08/06/13 1121 Last data filed at 08/06/13 1059  Gross per 24 hour  Intake 2862.5 ml  Output   1175 ml  Net 1687.5 ml    Last BM: 2/12  Labs:   Recent Labs Lab 08/05/13 0716 08/05/13 0726 08/06/13 0526  NA 129* 130* 132*  K 3.3* 3.2* 3.0*  CL 95* 98 97  CO2 19  --  19  BUN 16 14 14  CREATININE 1.11 1.20 0.96  CALCIUM 8.6  --  8.5  MG  --   --  1.9  PHOS  --   --  1.6*  GLUCOSE 101* 96 85    CBG (last 3)  No results found for this basename: GLUCAP,  in the last 72 hours  Scheduled Meds: . aztreonam  2 g Intravenous 3 times per day  . Chlorhexidine Gluconate Cloth  6 each Topical Q0600  . ipratropium-albuterol  3 mL Nebulization Q4H  . levofloxacin (LEVAQUIN) IV  750 mg Intravenous Q24H  . magic mouthwash  5 mL Oral QID  . mupirocin ointment  1 application Nasal BID  . OxyCODONE  20 mg Oral Q12H  . pantoprazole  40 mg Oral Daily   . senna-docusate  1 tablet Oral BID  . simvastatin  20 mg Oral QPM  . vancomycin  750 mg Intravenous Q12H    Continuous Infusions: . sodium chloride 50 mL/hr at 08/06/13 1119    Past Medical History  Diagnosis Date  . GERD (gastroesophageal reflux disease)   . Osteoarthritis   . Alcohol abuse     in recovery 2006  . 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 wine only  . Irritable bowel   . Hyperlipidemia   . Insomnia   . PAC (premature atrial contraction)     stable (ruled out for a fib)  . Prostatitis     chronic bacterial  . Pseudogout   . Sinus problem     chronic  . Cellulitis     2nd to T. Pedis(not at present)  . Hearing loss 03-10-12    bilateral hearing aids  . Complication of anesthesia 03-10-12    after shoulder  surgery -difficulty awakening,breathing problems  . Emphysema 03-10-12    tx. inhalers, uses steam room frequently  . Cramping of feet 03-10-12    cramping of both legs and hands occ.  . Osteoarthritis of left knee 03/17/2012  . Glaucoma   . Macular degeneration   . Hypertension   . Shortness of breath   . Non Hodgkin's lymphoma 03-10-12     '07-1 yr. in remission(Shadad)-not seeing now    Past Surgical History  Procedure Laterality Date  . Esophagogastroduodenoscopy  01-27-02  . Inguinal hernia repair  1979  . Torn biceps repair  03-10-12    Left rotator cuff repair  . Hernia repair    . Cataract surgery  03-10-12    03-04-12(right)/ (03-11-12-left)  . Total knee arthroplasty  03/17/2012    Procedure: TOTAL KNEE ARTHROPLASTY;  Surgeon: Johnny Bridge, MD;  Location: [facility];  Service: Orthopedics;  Laterality: Left;  Marland Kitchen Eye surgery  2014    cataract extraction with IOL both eyes staged  . Colonoscopy    . Rotator cuff repair Left   . Total knee arthroplasty Right 03/22/2013    Procedure: TOTAL KNEE ARTHROPLASTY;  Surgeon: Johnny Bridge, MD;  Location: [facility];  Service: Orthopedics;  Laterality: Right;  righ total  knee arthroplasty    Mikey College MS, RD, [place] Pager [phone] After Hours Pager

## 2013-08-06 NOTE — Progress Notes (Signed)
Follow-up:  Notified by RN that pt's daughter [name] relates that she and her father have discussed at length his code status and pt wishes to be DNR. As the record from earlier today seemed to indicate pt confused and unable to make this decision I met with pt and daughter at bedside. Pt noted resting quietly in NAD. He awakens easily and is able to listen and participate in conversation. Daughter relates that pt was confused about exactly what the DNR meant. Pt initially thought DNR status meant that "we were giving up on the cancer". After some discussion w/ pt and daughter it is clear that pt wants current treatments to continue. But if his heart were to stop or he were to stop breathing he does indicate that he would not want to have chest compressions or to be intubated. Daughter relates that pt remembers when his mother-in-law was intubated during a resuscitation and remained on the ventilator for several years. Pt related to [name] that he does not want that to happen to him. Pt did verbally agree that these are indeed his wishes. Based on my meeting with Jack [name] and his daughter I feel pt is indeed competent to make this decision. I have placed a DNR order w/ stipulation that all current therapies be continued w/ exception of chest compressions or intubation. I have informed pt and daughter that the Palliative Care Team has received the request for a consult to meet w/ family and discuss specific goals of care and they seem receptive to this meeting.     Jack Columbia, NP-C [facility] Pager [phone]

## 2013-08-06 NOTE — Progress Notes (Signed)
IP PROGRESS NOTE  Subjective:   Patient is doing better. No fevers or chills. No bleeding noted.   Objective:  Vital signs in last 24 hours: Temp:  [97.2 F (36.2 C)-100.6 F (38.1 C)] 97.2 F (36.2 C) (02/13 0400) Pulse Rate:  [91-119] 91 (02/13 0400) Resp:  [14-29] 20 (02/13 0400) BP: (110-140)/(51-96) 123/56 mmHg (02/13 0400) SpO2:  [94 %-99 %] 97 % (02/13 0400) Weight:  [181 lb 14.1 oz (82.5 kg)-200 lb 13.4 oz (91.1 kg)] 181 lb 14.1 oz (82.5 kg) (02/13 0400) Weight change:  Last BM Date: 08/05/13  Intake/Output from previous day: 02/12 0701 - 02/13 0700 In: 2600 [I.V.:1750; Blood:850] Out: 825 [Urine:825]  Mouth: mucous membranes moist, pharynx normal without lesions Resp: clear to auscultation bilaterally Cardio: regular rate and rhythm, S1, S2 normal, no murmur, click, rub or gallop GI: soft, non-tender; bowel sounds normal; no masses,  no organomegaly Extremities: extremities normal, atraumatic, no cyanosis. Left leg edema unchanged.   Portacath without erythema  Lab Results:  Recent Labs  08/05/13 0716 08/05/13 0726 08/06/13 0526  WBC 0.1*  --  0.3*  HGB 6.6* 6.8* 6.9*  HCT 19.2* 20.0* 19.8*  PLT 5*  --  10*    BMET  Recent Labs  08/05/13 0716 08/05/13 0726 08/06/13 0526  NA 129* 130* 132*  K 3.3* 3.2* 3.0*  CL 95* 98 97  CO2 19  --  19  GLUCOSE 101* 96 85  BUN 16 14 14   CREATININE 1.11 1.20 0.96  CALCIUM 8.6  --  8.5    Studies/Results: Ct Angio Chest Pe W/cm &/or Wo Cm  08/05/2013   CLINICAL DATA:  Hemoptysis; history of lymphoma  EXAM: CT ANGIOGRAPHY CHEST WITH CONTRAST  TECHNIQUE: Multidetector CT imaging of the chest was performed using the standard protocol during bolus administration of intravenous contrast. Multiplanar CT image reconstructions and MIPs were obtained to evaluate the vascular anatomy.  CONTRAST:  141mL OMNIPAQUE IOHEXOL 350 MG/ML SOLN  COMPARISON:  Chest CT June 29, 2013 and chest radiograph August 05, 2013   FINDINGS: There is a small pulmonary embolus in the left superior segment lower lobe pulmonary artery. This finding is best seen on axial series 6 slice 315. No larger pulmonary emboli are apparent. There is no thoracic aortic aneurysm or dissection.  There is patchy airspace consolidation in the left base. There is a degree of underlying emphysematous change.  On axial slice 48, series 7, there is an ill-defined nodular opacity which is new measuring 8 x 6 mm just lateral to the left hilum in the posterior segment of the left upper lobe. There is a calcified granuloma in the posterior segment of the left upper lobe which is stable.  There are small mediastinal lymph nodes but no adenopathy by size criteria. Port-A-Cath tip is in the superior vena cava. Pericardium is not thickened.  In the visualized upper abdomen, there is atherosclerotic change in the aorta as well as calcified granulomas in the liver and spleen. No upper abdominal adenopathy is seen. There are no appreciable blastic or lytic bone lesions. Thyroid appears normal.  Review of the MIP images confirms the above findings.  IMPRESSION: Small pulmonary embolus incompletely obstructing the left lower lobe superior segment pulmonary artery. No other pulmonary emboli seen.  New nodular opacity in the left perihilar region measuring 8 x 6 mm. Given known lymphoma, a followup study in 10-12 weeks may be advised to further assess.  Evidence of prior granulomatous disease.  No adenopathy. Critical  Value/emergent results were called by telephone at the time of interpretation on 08/05/2013 at 8:54 AM to Dr. Jinny Blossom, [facility] , who verbally acknowledged these results.   Electronically Signed   By: Lowella Grip M.D.   On: 08/05/2013 08:55   Dg Chest Portable 1 View  08/05/2013   CLINICAL DATA:  Lymphoma.  Spitting up blood.  EXAM: PORTABLE CHEST - 1 VIEW  COMPARISON:  CT chest, abdomen and pelvis 06/29/2013 and PA and lateral chest 06/01/2013.  FINDINGS:  Port-A-Cath is in place. Punctate calcified granuloma left upper lobe is identified. The lungs are otherwise clear. Heart size is normal. No pneumothorax or pleural effusion.  IMPRESSION: No acute disease.   Electronically Signed   By: Inge Rise M.D.   On: 08/05/2013 07:40    Medications: I have reviewed the patient's current medications.  Assessment/Plan:  80 year old gentleman with the following issues:  1. Relapsed diffuse large cell lymphoma presented with abdominal adenopathy and lower extremity edema. He status post salvage chemotherapy utilizing ICE with rituximab. He received the first cycle of chemotherapy and did well for the most part but now presenting with multiple complications related to that.  His next cycle on hold for now.    2. Pancytopenia: This is related to systemic chemotherapy. I agree with packed red cell transfusion to keep his hemoglobin of 8 as well as keep his platelet count above 10,000 unless he is bleeding. He received a Neulasta last week after systemic chemotherapy and unless he is septic with hemodynamic compromise I will not treat him with Neupogen at this time. I would transfuse PRBC today and likely platelets tomorrow.   3. Neutropenic sepsis: I agree with the broad spectrum antibiotic treatment he is receiving at this time. I anticipate white cell recovery in the next 24-48 hours.   4. Small pulmonary embolus on his CT scan: His risk of bleeding is very high at this point and I favor holding any anticoagulation for the time being.   We will continue to follow with you during this hospitalization.    LOS: 1 day   [facility] 08/06/2013, 8:04 AM

## 2013-08-07 DIAGNOSIS — D72819 Decreased white blood cell count, unspecified: Secondary | ICD-10-CM

## 2013-08-07 DIAGNOSIS — E43 Unspecified severe protein-calorie malnutrition: Secondary | ICD-10-CM

## 2013-08-07 DIAGNOSIS — E871 Hypo-osmolality and hyponatremia: Secondary | ICD-10-CM

## 2013-08-07 DIAGNOSIS — N390 Urinary tract infection, site not specified: Secondary | ICD-10-CM

## 2013-08-07 DIAGNOSIS — D72829 Elevated white blood cell count, unspecified: Secondary | ICD-10-CM

## 2013-08-07 DIAGNOSIS — E876 Hypokalemia: Secondary | ICD-10-CM

## 2013-08-07 LAB — BASIC METABOLIC PANEL
BUN: 11 mg/dL (ref 6–23)
CO2: 22 meq/L (ref 19–32)
CREATININE: 0.83 mg/dL (ref 0.50–1.35)
Calcium: 8.7 mg/dL (ref 8.4–10.5)
Chloride: 98 mEq/L (ref 96–112)
GFR calc Af Amer: >90 mL/min (ref >90)
GFR calc non Af Amer: 80 mL/min — ABNORMAL LOW (ref >90)
Glucose, Bld: 95 mg/dL (ref 70–99)
Potassium: 2.6 mEq/L — CL (ref 3.7–5.3)
SODIUM: 134 meq/L — AB (ref 137–147)

## 2013-08-07 LAB — CBC
HCT: 26.3 % — ABNORMAL LOW (ref 39.0–52.0)
Hemoglobin: 9.3 g/dL — ABNORMAL LOW (ref 13.0–17.0)
MCH: 27.7 pg (ref 26.0–34.0)
MCHC: 35.4 g/dL (ref 30.0–36.0)
MCV: 78.3 fL (ref 78.0–100.0)
Platelets: 14 10*3/uL — CL (ref 150–400)
RBC: 3.36 MIL/uL — AB (ref 4.22–5.81)
RDW: 15.5 % (ref 11.5–15.5)
WBC: 1.4 10*3/uL — CL (ref 4.0–10.5)

## 2013-08-07 LAB — VANCOMYCIN, TROUGH: VANCOMYCIN TR: 23.9 ug/mL — AB (ref 10.0–20.0)

## 2013-08-07 LAB — URINE CULTURE

## 2013-08-07 MED ORDER — ACETAMINOPHEN 325 MG PO TABS
650.0000 mg | ORAL_TABLET | Freq: Four times a day (QID) | ORAL | Status: DC | PRN
  Administered 2013-08-07 – 2013-08-15 (×3): 650 mg via ORAL
  Filled 2013-08-07 (×4): qty 2

## 2013-08-07 MED ORDER — POTASSIUM CHLORIDE CRYS ER 20 MEQ PO TBCR
40.0000 meq | EXTENDED_RELEASE_TABLET | Freq: Once | ORAL | Status: AC
  Administered 2013-08-07: 40 meq via ORAL
  Filled 2013-08-07: qty 2

## 2013-08-07 MED ORDER — BIOTENE DRY MOUTH MT LIQD
15.0000 mL | Freq: Two times a day (BID) | OROMUCOSAL | Status: DC
Start: 2013-08-07 — End: 2013-08-16
  Administered 2013-08-08 – 2013-08-16 (×18): 15 mL via OROMUCOSAL

## 2013-08-07 MED ORDER — OXYCODONE HCL 5 MG PO TABS
5.0000 mg | ORAL_TABLET | ORAL | Status: DC | PRN
  Administered 2013-08-07 – 2013-08-09 (×5): 5 mg via ORAL
  Filled 2013-08-07 (×5): qty 1

## 2013-08-07 MED ORDER — ACETAMINOPHEN 650 MG RE SUPP: 650.0000 mg | Freq: Four times a day (QID) | RECTAL | Status: DC | PRN

## 2013-08-07 MED ORDER — POTASSIUM CHLORIDE 10 MEQ/100ML IV SOLN
10.0000 meq | INTRAVENOUS | Status: AC
  Administered 2013-08-07 (×4): 10 meq via INTRAVENOUS
  Filled 2013-08-07 (×4): qty 100

## 2013-08-07 MED ORDER — HYDROMORPHONE HCL PF 1 MG/ML IJ SOLN
1.0000 mg | INTRAMUSCULAR | Status: DC | PRN
  Administered 2013-08-07: 1 mg via INTRAVENOUS
  Filled 2013-08-07: qty 1

## 2013-08-07 MED ORDER — OXYCODONE HCL 5 MG PO TABS: 5.0000 mg | ORAL_TABLET | Freq: Four times a day (QID) | ORAL | Status: DC | PRN

## 2013-08-07 MED ORDER — LORAZEPAM 2 MG/ML IJ SOLN
1.0000 mg | INTRAMUSCULAR | Status: DC | PRN
  Administered 2013-08-07 – 2013-08-12 (×6): 1 mg via INTRAVENOUS
  Filled 2013-08-07 (×6): qty 1

## 2013-08-07 MED ORDER — SODIUM CHLORIDE 0.9 % IV SOLN
1250.0000 mg | INTRAVENOUS | Status: DC
  Administered 2013-08-08 – 2013-08-11 (×4): 1250 mg via INTRAVENOUS
  Filled 2013-08-07 (×6): qty 1250

## 2013-08-07 NOTE — Progress Notes (Signed)
ANTIBIOTIC CONSULT NOTE - FOLLOW UP  Pharmacy Consult for aztreonam, vancomycin, levofloxacin Indication: sepsis  Allergies  Allergen Reactions  . Amoxicillin     REACTION: rash  . Latex Rash  . Penicillins Rash    Patient Measurements: Height: 5\' 11"  (180.3 cm) Weight: 178 lb 12.7 oz (81.1 kg) IBW/kg (Calculated) : 75.3   Vital Signs: Temp: 99.1 F (37.3 C) (02/14 1600) Temp src: Axillary (02/14 1200) BP: 136/67 mmHg (02/14 2200) Pulse Rate: 119 (02/14 2200) Intake/Output from previous day: 02/13 0701 - 02/14 0700 In: 1812.5 [I.V.:75; Blood:1087.5; IV Piggyback:650] Out: 2250 [Urine:2250] Intake/Output from this shift: Total I/O In: 250 [IV Piggyback:250] Out: -   Labs:  Recent Labs  08/05/13 0716 08/05/13 0726 08/06/13 0526 08/07/13 0431  WBC 0.1*  --  0.3* 1.4*  HGB 6.6* 6.8* 6.9* 9.3*  PLT 5*  --  10* 14*  CREATININE 1.11 1.20 0.96 0.83   Estimated CrCl approximately 50 mL/min  Estimated Creatinine Clearance: 73.1 ml/min (by C-G formula based on Cr of 0.83).  Recent Labs  08/07/13 2145  Grant 23.9*     Microbiology: Recent Results (from the past 720 hour(s))  CULTURE, BLOOD (ROUTINE X 2)     Status: None   Collection Time    08/05/13  8:52 AM      Result Value Ref Range Status   Specimen Description BLOOD RIGHT CHEST   Final   Special Requests BOTTLES DRAWN AEROBIC AND ANAEROBIC 5ML   Final   Culture  Setup Time     Final   Value: 08/05/2013 10:17     Performed at [facility]   Culture     Final   Value:        BLOOD CULTURE RECEIVED NO GROWTH TO DATE CULTURE WILL BE HELD FOR 5 DAYS BEFORE ISSUING A FINAL NEGATIVE REPORT     Performed at [facility]   Report Status PENDING   Incomplete  URINE CULTURE     Status: None   Collection Time    08/05/13  8:54 AM      Result Value Ref Range Status   Specimen Description URINE, CLEAN CATCH   Final   Special Requests NONE   Final   Culture  Setup Time     Final   Value:  08/05/2013 12:58     Performed at [place]     Final   Value: >=100,000 COLONIES/ML     Performed at [facility]   Culture     Final   Value: ENTEROCOCCUS SPECIES     Performed at [facility]   Report Status 08/07/2013 FINAL   Final   Organism ID, Bacteria ENTEROCOCCUS SPECIES   Final  CULTURE, BLOOD (ROUTINE X 2)     Status: None   Collection Time    08/05/13  9:00 AM      Result Value Ref Range Status   Specimen Description BLOOD LEFT HAND   Final   Special Requests BOTTLES DRAWN AEROBIC AND ANAEROBIC 4CC   Final   Culture  Setup Time     Final   Value: 08/05/2013 10:17     Performed at [facility]   Culture     Final   Value:        BLOOD CULTURE RECEIVED NO GROWTH TO DATE CULTURE WILL BE HELD FOR 5 DAYS BEFORE ISSUING A FINAL NEGATIVE REPORT     Performed at [facility]   Report Status PENDING   Incomplete  MRSA PCR SCREENING     Status: Abnormal   Collection Time    08/05/13  4:14 PM      Result Value Ref Range Status   MRSA by PCR POSITIVE (*) NEGATIVE Final   Comment:            The GeneXpert MRSA Assay (FDA     approved for NASAL specimens     only), is one component of a     comprehensive MRSA colonization     surveillance program. It is not     intended to diagnose MRSA     infection nor to guide or     monitor treatment for     MRSA infections.     RESULT CALLED TO, READ BACK BY AND VERIFIED WITH:     Marlou Sa RN [id number] 08/05/13 A NAVARRO    Medical History: Past Medical History  Diagnosis Date  . GERD (gastroesophageal reflux disease)   . Osteoarthritis   . Alcohol abuse     in recovery 2006  . 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 wine only  . Irritable bowel   . Hyperlipidemia   . Insomnia   . PAC (premature atrial contraction)     stable (ruled out for a fib)  . Prostatitis     chronic bacterial  . Pseudogout   . Sinus problem     chronic   . Cellulitis     2nd to T. Pedis(not at present)  . Hearing loss 03-10-12    bilateral hearing aids  . Complication of anesthesia 03-10-12    after shoulder  surgery -difficulty awakening,breathing problems  . Emphysema 03-10-12    tx. inhalers, uses steam room frequently  . Cramping of feet 03-10-12    cramping of both legs and hands occ.  . Osteoarthritis of left knee 03/17/2012  . Glaucoma   . Macular degeneration   . Hypertension   . Shortness of breath   . Non Hodgkin's lymphoma 03-10-12     '07-1 yr. in remission(Shadad)-not seeing now    Medications:  Scheduled:  . antiseptic oral rinse  15 mL Mouth Rinse BID  . aztreonam  2 g Intravenous 3 times per day  . Chlorhexidine Gluconate Cloth  6 each Topical Q0600  . feeding supplement (RESOURCE BREEZE)  1 Container Oral TID BM  . fluconazole (DIFLUCAN) IV  100 mg Intravenous Q24H  . ipratropium-albuterol  3 mL Nebulization Q6H  . levofloxacin (LEVAQUIN) IV  750 mg Intravenous Q24H  . magic mouthwash  5 mL Oral QID  . multivitamin with minerals  1 tablet Oral Daily  . mupirocin ointment  1 application Nasal BID  . OxyCODONE  20 mg Oral Q12H  . pantoprazole  40 mg Oral Daily  . senna-docusate  1 tablet Oral BID  . simvastatin  20 mg Oral QPM  . vancomycin  750 mg Intravenous Q12H   Infusions:    PRN:   Assessment: 80 y/o M on chemotherapy (R-ICE) for Non-Hodgkin's lymphoma, received first cycle 2/4 - 2/6, was brought to ED with weakness, confusion, cough, sore throat, and found to be pancytopenic.  Empiric antibiotics were ordered for suspected sepsis and pharmacy assistance with dosing was requested.  Culture results: Urine - Enterococcus (S-Amp, Vanc, Nitrofurantoin; R-Levo, tetracycline) Blood x 2 - NG to date   Day #3 Vanc/Aztreonam/Levofloxacin for suspected sepsis and UTI Vancomycin trough  level = 23.9 mcg/ml on Vanc 750mg  IV q12h.  Vancomycin level drawn @ 21:45 and Vanc 750mg  dose charted @ 21:30.  Called to  clarify with RN, who stated Vanc 750mg  dose scanned @ 21:30 but was not hung and administered until after trough level drawn, therefore elevated trough is true and reflective of accumulation  Goal of Therapy:  Appropriate antibiotic dosing for renal function; eradication of infection Vancomycin trough 15-20  Plan:  Continue Aztreonam 2gm IV q8h Continue Levofloxacin 750mg  IV q24h Change Vancomycin to 1250mg  IV q24h (beginning with next dose) Follow serum creatinine, cultures, clinical course. Check vancomycin trough at steady-state.   Shawna Kiener, [place] Harbour, PharmD 08/07/2013,10:50 PM

## 2013-08-07 NOTE — Progress Notes (Signed)
Jack [name]   DOB:[birth year]   WE#:[id number]    Subjective: The patient is fatigued. He is not answering much questions. Family members felt that he is slightly more alert. No reported bleeding from staff accept this morning after venipuncture.  Objective:  Filed Vitals:   08/07/13 1200  BP:   Pulse:   Temp: 102.2 F (39 C)  Resp:      Intake/Output Summary (Last 24 hours) at 08/07/13 1457 Last data filed at 08/07/13 0700  Gross per 24 hour  Intake  562.5 ml  Output   1900 ml  Net -1337.5 ml    GENERAL:alert, in moderate respiratory distress but appears comfortable  SKIN: skin color, texture, turgor are normal, no rashes or significant lesions. Noted bruising EYES: Unable to assess, his eyes are closed  OROPHARYNX:no exudate, no erythema and lips, buccal mucosa, and tongue normal . No thrush NECK: supple, thyroid normal size, non-tender, without nodularity LYMPH:  no palpable lymphadenopathy in the cervical, axillary or inguinal LUNGS: clear to auscultation and percussion with mildly increased breathing effort HEART: Tachycardia but with normal rhythm and no murmurs ABDOMEN:abdomen soft, non-tender and normal bowel sounds Musculoskeletal:no cyanosis of digits and no clubbing  NEURO: Unable to assess but no focal neurological deficits are noted   Labs:  Lab Results  Component Value Date   WBC 1.4* 08/07/2013   HGB 9.3* 08/07/2013   HCT 26.3* 08/07/2013   MCV 78.3 08/07/2013   PLT 14* 08/07/2013   NEUTROABS 0.2* 08/06/2013    Lab Results  Component Value Date   NA 134* 08/07/2013   K 2.6* 08/07/2013   CL 98 08/07/2013   CO2 22 08/07/2013    Studies:  Dg Chest 1 View  08/06/2013   CLINICAL DATA:  Weakness  EXAM: CHEST - 1 VIEW  COMPARISON:  Chest radiograph and chest CT August 05, 2013.  FINDINGS: Central catheter tip is at the cavoatrial junction. No pneumothorax. There is a calcified granuloma in the left mid lung. There is no edema or consolidation. Heart size and  pulmonary vascularity are normal. No adenopathy. There is arthropathy in both shoulders.  IMPRESSION: No pneumothorax. No edema or consolidation. Calcified granuloma left midlung.   Electronically Signed   By: Lowella Grip M.D.   On: 08/06/2013 08:09    Assessment & Plan:  #1 neutropenic fever Currently not receiving G-CSF Consider G-CSF if the patient starts to require pressor support. Overall he is improving with white count today at 1.4 but he is still persistently neutropenic #2 urinary tract infection, sepsis He is currently receiving appropriate triple antibiotics coverage for sepsis #3 altered mental status This is related to sepsis. Overall family members noted improvement #4 severe thrombocytopenia This is related to recent chemotherapy. Recommend transfusion if platelet count dropped to less than 10,000 all if the patient has signs of active bleeding #5 anemia He had received blood transfusion yesterday. Hemoglobin is stable #6 transformed diffuse large B-cell lymphoma He has received chemotherapy recently. Continue supportive care #7 hyponatremia This is improving with intravenous fluids #8 hypokalemia He is receiving replacement therapy #9 DVT prophylaxis He currently has TEDS and SCD. [facility], [place], MD 08/07/2013  2:57 PM

## 2013-08-07 NOTE — Progress Notes (Signed)
[facility] PROGRESS NOTE  Jack [name] QJJ:[id number] DOB: 23-Oct-1928 DOA: 08/05/2013 PCP: Adella Hare, MD  Assessment/Plan: 1. Sepsis, present on admission, evidenced by white count of 0.1, heart rate of 121, with presence of metabolic encephalopathy. Suspect source of infection to be in his lungs given complaints of cough, sputum production and fevers. Will continue broad-spectrum IV antibiotic therapy with vancomycin, aztreonam, and Levaquin. Blood cultures remain negative however urine growing Enterococcus species. Organism susceptible to Vancomycin and Ampicillin.  2. Neutropenic fever. Patient with history of follicular lymphoma, undergoing chemotherapy at the [place], presented with a white count of 0.1, he remains on broad-spectrum IV antibiotic therapy, follow cultures, provide supportive care. 3. Pancytopenia secondary to systemic chemotherapy. Patient transfused with 2 units of PRBC's  and 2 units of PLT's on 08/06/13. Hg improved to 9.3 and PLT count at 14,000.  4. Urinary Tract Infection. Urine cultures growing enterococcus species, organism susceptible to Vancomycin and Ampicillin.  5. Pulmonary embolism. He had a CT scan of lungs with contrast yesterday that showed a small pulmonary embolus and completely obstructing the left lower lobe superior segment pulmonary artery. He is hemodynamically stable, not an anticoagulation candidate given significant anemia and thrombocytopenia in setting of chemotherapy. Risks of anticoagulation outweigh benefits at this time. 6. Hypokalemic. AM labs showing potassium of 2.6. He was administered 73meq of IV potassium this morning, will give additional Kdur.  7. Hyponatremia. Lab work showing a sodium of 132, suspect secondary to hypovolemia this patient endorses minimal by mouth intake. Receiving IV fluid resuscitation 8. Stage II follicular lymphoma. Patient had been on salvage chemotherapy at the [facility] with ifosphamide,  carboplatin, etoposide, and rituxan. Care consulted for goals of care. After further discussion over night regarding CODE STATUS with his daughters at bedside, patient made a DO NOT RESUSCITATE per his wishes.   Code Status: DNR Family Communication: I spoke with his daughters present at bedside Disposition Plan: Continue close monitoring in Step Down.    Consultants:  Medical Oncology  Antibiotics:  Vancomycin (started on 08/05/2013)  Aztreonam (started on 08/05/2013)  Levaquin (started on 08/05/2013)  HPI/Subjective: Patient is a pleasant 80 year old man with a past medical history of stage II follicular lymphoma that was diagnosed in 2007, currently undergoing salvage chemotherapy with ifosfamide, carboplatin, etoposide and Rituxan at the [facility]. He presented to the emergency room on 08/05/2013 with complaints of cough, fevers, sputum production as well as minimal amount of hemoptysis. Symptoms had worsened over the past 2-3 days. Initial lab work in the emergency room showed a white count of 0.1 with hemoglobin of 6.6 and platelet count of 5. Patient was seen and evaluated by his medical oncologist Dr Alen Blew. He was transfused with platelets overnight. He was also started on broad-spectrum IV antibiotic therapy with vancomycin, aztreonam and Levaquin. T  Patient making little improvement since yesterday as he continues to feel poorly. He complains of lower abdominal pain, generalized body aches, weakness and fatigue.    Objective: Filed Vitals:   08/07/13 1100  BP: 140/85  Pulse: 110  Temp:   Resp: 25    Intake/Output Summary (Last 24 hours) at 08/07/13 1210 Last data filed at 08/07/13 0700  Gross per 24 hour  Intake   1200 ml  Output   1900 ml  Net   -700 ml   Filed Weights   08/05/13 1604 08/06/13 0400 08/07/13 0400  Weight: 91.1 kg (200 lb 13.4 oz) 82.5 kg (181 lb 14.1 oz) 81.1 kg (178 lb  12.7 oz)    Exam:   General:  Patient is ill-appearing, cachetic,  complaining of lower abd pain  Cardiovascular: Tachycardic, regular rate rhythm normal S1-S2  Respiratory: Positive bilateral rhonchi, expiratory wheezing, crackles. Mildly tachypnea, room air currently satting mid 90s.  Abdomen: Soft, nontender, nondistended  Musculoskeletal: Left lower extremity edema noted, mild localized erythema  Data Reviewed: Basic Metabolic Panel:  Recent Labs Lab 08/05/13 0716 08/05/13 0726 08/06/13 0526 08/07/13 0431  NA 129* 130* 132* 134*  K 3.3* 3.2* 3.0* 2.6*  CL 95* 98 97 98  CO2 19  --  19 22  GLUCOSE 101* 96 85 95  BUN 16 14 14 11   CREATININE 1.11 1.20 0.96 0.83  CALCIUM 8.6  --  8.5 8.7  MG  --   --  1.9  --   PHOS  --   --  1.6*  --    Liver Function Tests:  Recent Labs Lab 08/05/13 0716 08/06/13 0526  AST 22 32  ALT 11 15  ALKPHOS 67 68  BILITOT 0.5 0.5  PROT 5.7* 5.5*  ALBUMIN 2.7* 2.4*   No results found for this basename: LIPASE, AMYLASE,  in the last 168 hours No results found for this basename: AMMONIA,  in the last 168 hours CBC:  Recent Labs Lab 08/05/13 0716 08/05/13 0726 08/06/13 0526 08/07/13 0431  WBC 0.1*  --  0.3* 1.4*  NEUTROABS  --   --  0.2*  --   HGB 6.6* 6.8* 6.9* 9.3*  HCT 19.2* 20.0* 19.8* 26.3*  MCV 79.7  --  79.5 78.3  PLT 5*  --  10* 14*   Cardiac Enzymes: No results found for this basename: CKTOTAL, CKMB, CKMBINDEX, TROPONINI,  in the last 168 hours BNP (last 3 results) No results found for this basename: PROBNP,  in the last 8760 hours CBG: No results found for this basename: GLUCAP,  in the last 168 hours  Recent Results (from the past 240 hour(s))  CULTURE, BLOOD (ROUTINE X 2)     Status: None   Collection Time    08/05/13  8:52 AM      Result Value Ref Range Status   Specimen Description BLOOD RIGHT CHEST   Final   Special Requests BOTTLES DRAWN AEROBIC AND ANAEROBIC 5ML   Final   Culture  Setup Time     Final   Value: 08/05/2013 10:17     Performed at [facility]    Culture     Final   Value:        BLOOD CULTURE RECEIVED NO GROWTH TO DATE CULTURE WILL BE HELD FOR 5 DAYS BEFORE ISSUING A FINAL NEGATIVE REPORT     Performed at [facility]   Report Status PENDING   Incomplete  URINE CULTURE     Status: None   Collection Time    08/05/13  8:54 AM      Result Value Ref Range Status   Specimen Description URINE, CLEAN CATCH   Final   Special Requests NONE   Final   Culture  Setup Time     Final   Value: 08/05/2013 12:58     Performed at [place]     Final   Value: >=100,000 COLONIES/ML     Performed at [facility]   Culture     Final   Value: ENTEROCOCCUS SPECIES     Performed at [facility]   Report Status 08/07/2013 FINAL  Final   Organism ID, Bacteria ENTEROCOCCUS SPECIES   Final  CULTURE, BLOOD (ROUTINE X 2)     Status: None   Collection Time    08/05/13  9:00 AM      Result Value Ref Range Status   Specimen Description BLOOD LEFT HAND   Final   Special Requests BOTTLES DRAWN AEROBIC AND ANAEROBIC 4CC   Final   Culture  Setup Time     Final   Value: 08/05/2013 10:17     Performed at [facility]   Culture     Final   Value:        BLOOD CULTURE RECEIVED NO GROWTH TO DATE CULTURE WILL BE HELD FOR 5 DAYS BEFORE ISSUING A FINAL NEGATIVE REPORT     Performed at [facility]   Report Status PENDING   Incomplete  MRSA PCR SCREENING     Status: Abnormal   Collection Time    08/05/13  4:14 PM      Result Value Ref Range Status   MRSA by PCR POSITIVE (*) NEGATIVE Final   Comment:            The GeneXpert MRSA Assay (FDA     approved for NASAL specimens     only), is one component of a     comprehensive MRSA colonization     surveillance program. It is not     intended to diagnose MRSA     infection nor to guide or     monitor treatment for     MRSA infections.     RESULT CALLED TO, READ BACK BY AND VERIFIED WITH:     Marlou Sa RN [id number] 08/05/13 A NAVARRO      Studies: Dg Chest 1 View  08/06/2013   CLINICAL DATA:  Weakness  EXAM: CHEST - 1 VIEW  COMPARISON:  Chest radiograph and chest CT August 05, 2013.  FINDINGS: Central catheter tip is at the cavoatrial junction. No pneumothorax. There is a calcified granuloma in the left mid lung. There is no edema or consolidation. Heart size and pulmonary vascularity are normal. No adenopathy. There is arthropathy in both shoulders.  IMPRESSION: No pneumothorax. No edema or consolidation. Calcified granuloma left midlung.   Electronically Signed   By: Lowella Grip M.D.   On: 08/06/2013 08:09    Scheduled Meds: . aztreonam  2 g Intravenous 3 times per day  . Chlorhexidine Gluconate Cloth  6 each Topical Q0600  . feeding supplement (RESOURCE BREEZE)  1 Container Oral TID BM  . fluconazole (DIFLUCAN) IV  100 mg Intravenous Q24H  . ipratropium-albuterol  3 mL Nebulization Q6H  . levofloxacin (LEVAQUIN) IV  750 mg Intravenous Q24H  . magic mouthwash  5 mL Oral QID  . multivitamin with minerals  1 tablet Oral Daily  . mupirocin ointment  1 application Nasal BID  . OxyCODONE  20 mg Oral Q12H  . pantoprazole  40 mg Oral Daily  . senna-docusate  1 tablet Oral BID  . simvastatin  20 mg Oral QPM  . vancomycin  750 mg Intravenous Q12H   Continuous Infusions:    Principal Problem:   Neutropenic fever Active Problems:   LYMPHOMA   HYPERLIPIDEMIA   SIRS (systemic inflammatory response syndrome)   Sepsis   Anemia   Leukopenia   Antineoplastic chemotherapy induced pancytopenia   Thrombocytopenia, unspecified   Hemoptysis   Cellulitis   Acute pulmonary embolism   Protein-calorie malnutrition, severe    Time  spent: 45 minutes    Kelvin Cellar  [facility] Pager [phone]. If 7PM-7AM, please contact night-coverage at [email or web address], password [facility] 08/07/2013, 12:10 PM  LOS: 2 days

## 2013-08-08 LAB — CBC WITH DIFFERENTIAL/PLATELET
BASOS PCT: 0 % (ref 0–1)
Basophils Absolute: 0 10*3/uL (ref 0.0–0.1)
EOS PCT: 0 % (ref 0–5)
Eosinophils Absolute: 0 10*3/uL (ref 0.0–0.7)
HCT: 28.1 % — ABNORMAL LOW (ref 39.0–52.0)
HEMOGLOBIN: 9.8 g/dL — AB (ref 13.0–17.0)
LYMPHS PCT: 4 % — AB (ref 12–46)
Lymphs Abs: 0.1 10*3/uL — ABNORMAL LOW (ref 0.7–4.0)
MCH: 27.4 pg (ref 26.0–34.0)
MCHC: 34.9 g/dL (ref 30.0–36.0)
MCV: 78.5 fL (ref 78.0–100.0)
MONOS PCT: 14 % — AB (ref 3–12)
Monocytes Absolute: 0.4 10*3/uL (ref 0.1–1.0)
NEUTROS PCT: 82 % — AB (ref 43–77)
Neutro Abs: 2.5 10*3/uL (ref 1.7–7.7)
Platelets: 9 10*3/uL — CL (ref 150–400)
RBC: 3.58 MIL/uL — AB (ref 4.22–5.81)
RDW: 15.8 % — ABNORMAL HIGH (ref 11.5–15.5)
WBC: 3 10*3/uL — AB (ref 4.0–10.5)

## 2013-08-08 LAB — BASIC METABOLIC PANEL
BUN: 15 mg/dL (ref 6–23)
CHLORIDE: 100 meq/L (ref 96–112)
CO2: 21 meq/L (ref 19–32)
Calcium: 8.9 mg/dL (ref 8.4–10.5)
Creatinine, Ser: 0.97 mg/dL (ref 0.50–1.35)
GFR calc Af Amer: 87 mL/min — ABNORMAL LOW (ref >90)
GFR calc non Af Amer: 75 mL/min — ABNORMAL LOW (ref >90)
Glucose, Bld: 162 mg/dL — ABNORMAL HIGH (ref 70–99)
Potassium: 3.2 mEq/L — ABNORMAL LOW (ref 3.7–5.3)
Sodium: 135 mEq/L — ABNORMAL LOW (ref 137–147)

## 2013-08-08 MED ORDER — POTASSIUM CHLORIDE CRYS ER 20 MEQ PO TBCR
40.0000 meq | EXTENDED_RELEASE_TABLET | Freq: Four times a day (QID) | ORAL | Status: AC
  Administered 2013-08-08 (×2): 40 meq via ORAL
  Filled 2013-08-08 (×2): qty 2

## 2013-08-08 NOTE — Progress Notes (Signed)
Despite instruction by RN to not give patient thin liquids, including water and fruit juice, patient's daughter gave patient orange juice and patient appeared to choke on it.  Orange juice was removed from bedside.  Will continue to monitor.

## 2013-08-08 NOTE — Progress Notes (Signed)
[facility] PROGRESS NOTE  Jack [name] [id number] DOB: 12-23-1928 DOA: 08/05/2013 PCP: Adella Hare, MD  Assessment/Plan: 1. Sepsis, present on admission, evidenced by white count of 0.1, heart rate of 121, with presence of metabolic encephalopathy. Blood cultures remain negative however urine growing Enterococcus species. Organism susceptible to Vancomycin and Ampicillin. Continue broad spectrum antibiotics.  2. Neutropenic fever. Patient with history of follicular lymphoma, undergoing chemotherapy at the [place], presented with a white count of 0.1, he remains on broad-spectrum IV antibiotic therapy, follow cultures, provide supportive care. 3. Pancytopenia secondary to systemic chemotherapy. Patient transfused with 2 units of PRBC's  and 2 units of PLT's on 08/06/13. Hg stable however platelet count down to 9,000. Will transfuse 3 units of platelets.   4. Urinary Tract Infection. Urine cultures growing enterococcus species, organism susceptible to Vancomycin and Ampicillin.  5. Pulmonary embolism. He had a CT scan of lungs with contrast yesterday that showed a small pulmonary embolus and completely obstructing the left lower lobe superior segment pulmonary artery. He is hemodynamically stable, not an anticoagulation candidate given significant anemia and thrombocytopenia in setting of chemotherapy. Risks of anticoagulation outweigh benefits at this time. 6. Hypokalemic. AM labs showing potassium of 2.6 on 08/07/13, improved to 3.2.   7. Hyponatremia. Labs showed improving sodium, now at 135.  8. Stage II follicular lymphoma. Patient had been on salvage chemotherapy at the [facility] with ifosphamide, carboplatin, etoposide, and rituxan. Care consulted for goals of care. After further discussion over night regarding CODE STATUS with his daughters at bedside, patient made a DO NOT RESUSCITATE per his wishes.   Code Status: DNR Family Communication: I spoke with his daughters  present at bedside Disposition Plan: Continue close monitoring in Step Down.    Consultants:  Medical Oncology  Antibiotics:  Vancomycin (started on 08/05/2013)  Aztreonam (started on 08/05/2013)  Levaquin (started on 08/05/2013)  HPI/Subjective: Patient is a pleasant 80 year old man with a past medical history of stage II follicular lymphoma that was diagnosed in 2007, currently undergoing salvage chemotherapy with ifosfamide, carboplatin, etoposide and Rituxan at the [facility]. He presented to the emergency room on 08/05/2013 with complaints of cough, fevers, sputum production as well as minimal amount of hemoptysis. Symptoms had worsened over the past 2-3 days. Initial lab work in the emergency room showed a white count of 0.1 with hemoglobin of 6.6 and platelet count of 5. Patient was seen and evaluated by his medical oncologist Dr Alen Blew. He was transfused with platelets overnight. He was also started on broad-spectrum IV antibiotic therapy with vancomycin, aztreonam and Levaquin. T  Patient making little improvement since yesterday as he continues to feel poorly.     Objective: Filed Vitals:   08/08/13 1510  BP: 149/84  Pulse:   Temp:   Resp:     Intake/Output Summary (Last 24 hours) at 08/08/13 1550 Last data filed at 08/08/13 1400  Gross per 24 hour  Intake 1295.83 ml  Output   1585 ml  Net -289.17 ml   Filed Weights   08/05/13 1604 08/06/13 0400 08/07/13 0400  Weight: 91.1 kg (200 lb 13.4 oz) 82.5 kg (181 lb 14.1 oz) 81.1 kg (178 lb 12.7 oz)    Exam:   General:  Patient is ill-appearing, cachetic, complaining of lower abd pain  Cardiovascular: Tachycardic, regular rate rhythm normal S1-S2  Respiratory: Positive bilateral rhonchi, expiratory wheezing, crackles. Mildly tachypnea, room air currently satting mid 90s.  Abdomen: Soft, nontender, nondistended  Musculoskeletal: Left lower extremity edema  noted, mild localized erythema  Data  Reviewed: Basic Metabolic Panel:  Recent Labs Lab 08/05/13 0716 08/05/13 0726 08/06/13 0526 08/07/13 0431 08/08/13 0645  NA 129* 130* 132* 134* 135*  K 3.3* 3.2* 3.0* 2.6* 3.2*  CL 95* 98 97 98 100  CO2 19  --  19 22 21   GLUCOSE 101* 96 85 95 162*  BUN 16 14 14 11 15   CREATININE 1.11 1.20 0.96 0.83 0.97  CALCIUM 8.6  --  8.5 8.7 8.9  MG  --   --  1.9  --   --   PHOS  --   --  1.6*  --   --    Liver Function Tests:  Recent Labs Lab 08/05/13 0716 08/06/13 0526  AST 22 32  ALT 11 15  ALKPHOS 67 68  BILITOT 0.5 0.5  PROT 5.7* 5.5*  ALBUMIN 2.7* 2.4*   No results found for this basename: LIPASE, AMYLASE,  in the last 168 hours No results found for this basename: AMMONIA,  in the last 168 hours CBC:  Recent Labs Lab 08/05/13 0716 08/05/13 0726 08/06/13 0526 08/07/13 0431 08/08/13 0645  WBC 0.1*  --  0.3* 1.4* 3.0*  NEUTROABS  --   --  0.2*  --  2.5  HGB 6.6* 6.8* 6.9* 9.3* 9.8*  HCT 19.2* 20.0* 19.8* 26.3* 28.1*  MCV 79.7  --  79.5 78.3 78.5  PLT 5*  --  10* 14* 9*   Cardiac Enzymes: No results found for this basename: CKTOTAL, CKMB, CKMBINDEX, TROPONINI,  in the last 168 hours BNP (last 3 results) No results found for this basename: PROBNP,  in the last 8760 hours CBG: No results found for this basename: GLUCAP,  in the last 168 hours  Recent Results (from the past 240 hour(s))  CULTURE, BLOOD (ROUTINE X 2)     Status: None   Collection Time    08/05/13  8:52 AM      Result Value Ref Range Status   Specimen Description BLOOD RIGHT CHEST   Final   Special Requests BOTTLES DRAWN AEROBIC AND ANAEROBIC 5ML   Final   Culture  Setup Time     Final   Value: 08/05/2013 10:17     Performed at [facility]   Culture     Final   Value:        BLOOD CULTURE RECEIVED NO GROWTH TO DATE CULTURE WILL BE HELD FOR 5 DAYS BEFORE ISSUING A FINAL NEGATIVE REPORT     Performed at [facility]   Report Status PENDING   Incomplete  URINE CULTURE     Status:  None   Collection Time    08/05/13  8:54 AM      Result Value Ref Range Status   Specimen Description URINE, CLEAN CATCH   Final   Special Requests NONE   Final   Culture  Setup Time     Final   Value: 08/05/2013 12:58     Performed at [facility]     Final   Value: >=100,000 COLONIES/ML     Performed at [facility]   Culture     Final   Value: ENTEROCOCCUS SPECIES     Performed at [facility]   Report Status 08/07/2013 FINAL   Final   Organism ID, Bacteria ENTEROCOCCUS SPECIES   Final  CULTURE, BLOOD (ROUTINE X 2)     Status: None   Collection Time  08/05/13  9:00 AM      Result Value Ref Range Status   Specimen Description BLOOD LEFT HAND   Final   Special Requests BOTTLES DRAWN AEROBIC AND ANAEROBIC 4CC   Final   Culture  Setup Time     Final   Value: 08/05/2013 10:17     Performed at [facility]   Culture     Final   Value:        BLOOD CULTURE RECEIVED NO GROWTH TO DATE CULTURE WILL BE HELD FOR 5 DAYS BEFORE ISSUING A FINAL NEGATIVE REPORT     Performed at [facility]   Report Status PENDING   Incomplete  MRSA PCR SCREENING     Status: Abnormal   Collection Time    08/05/13  4:14 PM      Result Value Ref Range Status   MRSA by PCR POSITIVE (*) NEGATIVE Final   Comment:            The GeneXpert MRSA Assay (FDA     approved for NASAL specimens     only), is one component of a     comprehensive MRSA colonization     surveillance program. It is not     intended to diagnose MRSA     infection nor to guide or     monitor treatment for     MRSA infections.     RESULT CALLED TO, READ BACK BY AND VERIFIED WITH:     Marlou Sa RN [id number] 08/05/13 A NAVARRO     Studies: No results found.  Scheduled Meds: . antiseptic oral rinse  15 mL Mouth Rinse BID  . aztreonam  2 g Intravenous 3 times per day  . Chlorhexidine Gluconate Cloth  6 each Topical Q0600  . feeding supplement (RESOURCE BREEZE)  1 Container Oral  TID BM  . fluconazole (DIFLUCAN) IV  100 mg Intravenous Q24H  . ipratropium-albuterol  3 mL Nebulization Q6H  . levofloxacin (LEVAQUIN) IV  750 mg Intravenous Q24H  . magic mouthwash  5 mL Oral QID  . multivitamin with minerals  1 tablet Oral Daily  . mupirocin ointment  1 application Nasal BID  . OxyCODONE  20 mg Oral Q12H  . pantoprazole  40 mg Oral Daily  . potassium chloride  40 mEq Oral Q6H  . senna-docusate  1 tablet Oral BID  . simvastatin  20 mg Oral QPM  . vancomycin  1,250 mg Intravenous Q24H   Continuous Infusions:    Principal Problem:   Neutropenic fever Active Problems:   LYMPHOMA   HYPERLIPIDEMIA   SIRS (systemic inflammatory response syndrome)   Sepsis   Anemia   Leukopenia   Antineoplastic chemotherapy induced pancytopenia   Thrombocytopenia, unspecified   Hemoptysis   Cellulitis   Acute pulmonary embolism   Protein-calorie malnutrition, severe    Time spent: 35 minutes    Kelvin Cellar  [facility] Pager [phone]. If 7PM-7AM, please contact night-coverage at [email or web address], password [facility] 08/08/2013, 3:50 PM  LOS: 3 days

## 2013-08-08 NOTE — Progress Notes (Signed)
Jack [name]   DOB:04-30-1929   ZS#:[id number]    Subjective: Overall the patient is improving. He was sitting up attempting to eat some soup. There were no reported bleeding such as epistaxis, hematuria or hematochezia. He is receiving platelet transfusion for severe thrombocytopenia. Denies any cough. Family members do not report any new onset of confusion  Objective:  Filed Vitals:   08/08/13 1510  BP: 149/84  Pulse:   Temp:   Resp:      Intake/Output Summary (Last 24 hours) at 08/08/13 1605 Last data filed at 08/08/13 1400  Gross per 24 hour  Intake 1295.83 ml  Output   1585 ml  Net -289.17 ml    GENERAL:alert, no distress and comfortable SKIN: Excessive bruising noted on his arms. No petechiae hemorrhages EYES: normal, Conjunctiva are pink and non-injected, sclera clear OROPHARYNX:no exudate, no erythema and lips, buccal mucosa, and tongue normal  NECK: supple, thyroid normal size, non-tender, without nodularity LYMPH:  no palpable lymphadenopathy in the cervical, axillary or inguinal LUNGS: clear to auscultation and percussion with normal breathing effort HEART: regular rate & rhythm and no murmurs and no lower extremity edema ABDOMEN:abdomen soft, non-tender and normal bowel sounds Musculoskeletal:no cyanosis of digits and no clubbing  NEURO: alert & oriented x 3 with fluent speech, no focal motor/sensory deficits   Labs:  Lab Results  Component Value Date   WBC 3.0* 08/08/2013   HGB 9.8* 08/08/2013   HCT 28.1* 08/08/2013   MCV 78.5 08/08/2013   PLT 9* 08/08/2013   NEUTROABS 2.5 08/08/2013    Lab Results  Component Value Date   NA 135* 08/08/2013   K 3.2* 08/08/2013   CL 100 08/08/2013   CO2 21 08/08/2013   Assessment & Plan:  #1 neutropenic fever Overall he is improving with white count today at 3 and he is no longer neutropenic #2 urinary tract infection, sepsis He is currently receiving appropriate triple antibiotics coverage for sepsis #3 altered mental status,  improving This is related to sepsis. Overall family members noted improvement #4 severe thrombocytopenia This is related to recent chemotherapy. Platelet transfusion has been ordered #5 anemia He had received blood transfusion on 08/06/13. Hemoglobin is stable #6 transformed diffuse large B-cell lymphoma He has received chemotherapy recently. Continue supportive care #7 hyponatremia This is improving with intravenous fluids #8 hypokalemia He is receiving replacement therapy #9 DVT prophylaxis He currently has TEDS and SCD.   [facility], [place], [place] 08/08/2013  4:05 PM

## 2013-08-09 ENCOUNTER — Inpatient Hospital Stay (HOSPITAL_COMMUNITY): Payer: Medicare Other

## 2013-08-09 LAB — CBC
HCT: 26.5 % — ABNORMAL LOW (ref 39.0–52.0)
Hemoglobin: 9.1 g/dL — ABNORMAL LOW (ref 13.0–17.0)
MCH: 27.2 pg (ref 26.0–34.0)
MCHC: 34.3 g/dL (ref 30.0–36.0)
MCV: 79.1 fL (ref 78.0–100.0)
Platelets: 44 10*3/uL — ABNORMAL LOW (ref 150–400)
RBC: 3.35 MIL/uL — AB (ref 4.22–5.81)
RDW: 16.2 % — ABNORMAL HIGH (ref 11.5–15.5)
WBC: 3.4 10*3/uL — AB (ref 4.0–10.5)

## 2013-08-09 LAB — BASIC METABOLIC PANEL
BUN: 17 mg/dL (ref 6–23)
CHLORIDE: 104 meq/L (ref 96–112)
CO2: 23 meq/L (ref 19–32)
Calcium: 8.8 mg/dL (ref 8.4–10.5)
Creatinine, Ser: 1.02 mg/dL (ref 0.50–1.35)
GFR calc Af Amer: 77 mL/min — ABNORMAL LOW (ref >90)
GFR calc non Af Amer: 66 mL/min — ABNORMAL LOW (ref >90)
GLUCOSE: 121 mg/dL — AB (ref 70–99)
Potassium: 3.5 mEq/L — ABNORMAL LOW (ref 3.7–5.3)
SODIUM: 140 meq/L (ref 137–147)

## 2013-08-09 LAB — TYPE AND SCREEN
ABO/RH(D): B POS
Antibody Screen: NEGATIVE
UNIT DIVISION: 0
Unit division: 0
Unit division: 0
Unit division: 0

## 2013-08-09 LAB — PREPARE PLATELET PHERESIS
Unit division: 0
Unit division: 0
Unit division: 0
Unit division: 0

## 2013-08-09 MED ORDER — LEVALBUTEROL HCL 0.63 MG/3ML IN NEBU
0.6300 mg | INHALATION_SOLUTION | Freq: Four times a day (QID) | RESPIRATORY_TRACT | Status: DC | PRN
  Administered 2013-08-09: 0.63 mg via RESPIRATORY_TRACT
  Filled 2013-08-09: qty 3

## 2013-08-09 MED ORDER — LEVALBUTEROL HCL 0.63 MG/3ML IN NEBU
0.6300 mg | INHALATION_SOLUTION | Freq: Three times a day (TID) | RESPIRATORY_TRACT | Status: DC
  Administered 2013-08-09 – 2013-08-16 (×22): 0.63 mg via RESPIRATORY_TRACT
  Filled 2013-08-09 (×48): qty 3

## 2013-08-09 MED ORDER — PANTOPRAZOLE SODIUM 40 MG IV SOLR
40.0000 mg | INTRAVENOUS | Status: DC
Start: 2013-08-09 — End: 2013-08-12
  Administered 2013-08-09 – 2013-08-11 (×3): 40 mg via INTRAVENOUS
  Filled 2013-08-09 (×4): qty 40

## 2013-08-09 MED ORDER — IPRATROPIUM BROMIDE 0.02 % IN SOLN
0.5000 mg | Freq: Three times a day (TID) | RESPIRATORY_TRACT | Status: DC
  Administered 2013-08-09 – 2013-08-16 (×22): 0.5 mg via RESPIRATORY_TRACT
  Filled 2013-08-09 (×22): qty 2.5

## 2013-08-09 MED ORDER — POTASSIUM CHLORIDE CRYS ER 20 MEQ PO TBCR
40.0000 meq | EXTENDED_RELEASE_TABLET | Freq: Once | ORAL | Status: AC
  Administered 2013-08-09: 40 meq via ORAL
  Filled 2013-08-09: qty 2

## 2013-08-09 NOTE — Progress Notes (Signed)
IP PROGRESS NOTE  Subjective:   Patient is doing better. No fevers or chills. No bleeding noted. Still horse.  He was able to set in a chair the last few days.   Objective:  Vital signs in last 24 hours: Temp:  [97.2 F (36.2 C)-99 F (37.2 C)] 98.5 F (36.9 C) (02/16 0752) Pulse Rate:  [102-133] 102 (02/16 0400) Resp:  [17-29] 21 (02/16 0400) BP: (111-152)/(52-111) 121/75 mmHg (02/16 0400) SpO2:  [95 %-100 %] 96 % (02/16 0453) Weight:  [176 lb 12.9 oz (80.2 kg)] 176 lb 12.9 oz (80.2 kg) (02/16 0500) Weight change:  Last BM Date: 08/05/13  Intake/Output from previous day: 02/15 0701 - 02/16 0700 In: 1756.7 [Blood:1156.7; IV Piggyback:600] Out: 2993 [Urine:1085]  Mouth: mucous membranes moist, pharynx normal without lesions Resp: clear to auscultation bilaterally Cardio: regular rate and rhythm, S1, S2 normal, no murmur, click, rub or gallop GI: soft, non-tender; bowel sounds normal; no masses,  no organomegaly Extremities: extremities normal, atraumatic, no cyanosis. Left leg edema much improved.   Portacath without erythema  Lab Results:  Recent Labs  08/08/13 0645 08/09/13 0530  WBC 3.0* 3.4*  HGB 9.8* 9.1*  HCT 28.1* 26.5*  PLT 9* 44*    BMET  Recent Labs  08/08/13 0645 08/09/13 0530  NA 135* 140  K 3.2* 3.5*  CL 100 104  CO2 21 23  GLUCOSE 162* 121*  BUN 15 17  CREATININE 0.97 1.02  CALCIUM 8.9 8.8    Studies/Results: No results found.  Medications: I have reviewed the patient's current medications.  Assessment/Plan:  80 year old gentleman with the following issues:  1. Relapsed diffuse large cell lymphoma presented with abdominal adenopathy and lower extremity edema. He status post salvage chemotherapy utilizing ICE with rituximab. He received the first cycle of chemotherapy and did well for the most part but now presenting with multiple complications related to that.  His next cycle on hold for now.    2. Pancytopenia: This is related  to systemic chemotherapy. No transfusion needed today.   3. Neutropenic sepsis: I agree with the broad spectrum antibiotic treatment he is receiving at this time. I anticipate white cell recovery soon.   4. Small pulmonary embolus on his CT scan: His risk of bleeding is very high at this point and I favor holding any anticoagulation for the time being.   We will continue to follow with you during this hospitalization.    LOS: 4 days   [facility] 08/09/2013, 7:53 AM

## 2013-08-09 NOTE — Progress Notes (Signed)
[facility] PROGRESS NOTE  Jack [name] [id number] DOB: Sep 10, 1928 DOA: 08/05/2013 PCP: Adella Hare, MD  Assessment/Plan: 1. Sepsis, present on admission, evidenced by white count of 0.1, heart rate of 121, with presence of metabolic encephalopathy. Blood cultures remain negative however urine growing Enterococcus species. Organism susceptible to Vancomycin and Ampicillin. Will continue Vancomycin therapy. Blood cultures negative x 2 sets. Neutropenia resolved, afebrile, has received 3 days of triple antibiotic coverage. Will narrow antibiotic spectrum to Vancomycin given urine culture findings.  2. Neutropenic fever. Patient with history of follicular lymphoma, undergoing chemotherapy at the [facility], presented with a white count of 0.1. Now Resolving.  3. Pancytopenia secondary to systemic chemotherapy. Patient transfused with 2 units of PRBC's  and 2 units of PLT's on 08/06/13. Hg stable however platelet count down to 9,000. Transfuse 3 units of platelets on 08/08/13. Labs improving this AM. Patient no longer neutropenic. Platelet count up to 44,000.    4. Urinary Tract Infection. Urine cultures growing enterococcus species, organism susceptible to Vancomycin and Ampicillin. He is currently on Vancomycin.  5. Pulmonary embolism. He had a CT scan of lungs with contrast yesterday that showed a small pulmonary embolus and completely obstructing the left lower lobe superior segment pulmonary artery. He is hemodynamically stable, not an anticoagulation candidate given significant anemia and thrombocytopenia in setting of chemotherapy. Risks of anticoagulation outweigh benefits at this time. 6. Hypokalemic. AM labs showing potassium of 2.6 on 08/07/13, improved to 3.5 on 08/09/13. .   7. Hyponatremia. Labs showed improving sodium on 08/09/13 at 140.  8. Stage II follicular lymphoma. Patient had been on salvage chemotherapy at the [facility] with ifosphamide, carboplatin, etoposide, and  rituxan. Care consulted for goals of care. After further discussion over night regarding CODE STATUS with his daughters at bedside, patient made a DO NOT RESUSCITATE per his wishes.   Code Status: DNR Family Communication: I spoke with his daughters present at bedside Disposition Plan: Will transfer to Med/surg    Consultants:  Medical Oncology  Antibiotics:  Vancomycin (started on 08/05/2013)  Aztreonam (started on 08/05/2013 discontinued on 08/09/13)  Levaquin (started on 08/05/2013 discontinued on 08/09/13)  HPI/Subjective: Patient is a pleasant 80 year old man with a past medical history of stage II follicular lymphoma that was diagnosed in 2007, currently undergoing salvage chemotherapy with ifosfamide, carboplatin, etoposide and Rituxan at the [facility]. He presented to the emergency room on 08/05/2013 with complaints of cough, fevers, sputum production as well as minimal amount of hemoptysis. Symptoms had worsened over the past 2-3 days. Initial lab work in the emergency room showed a white count of 0.1 with hemoglobin of 6.6 and platelet count of 5. Patient was seen and evaluated by his medical oncologist Dr Alen Blew. He was transfused with platelets overnight. He was also started on broad-spectrum IV antibiotic therapy with vancomycin, aztreonam and Levaquin.   Appears ill although labs showing improvement. He has been helped out of bed to chair several times. There is concern with aspiration, modified barium ordered.      Objective: Filed Vitals:   08/09/13 1200  BP: 117/76  Pulse: 107  Temp:   Resp: 28    Intake/Output Summary (Last 24 hours) at 08/09/13 1304 Last data filed at 08/09/13 0554  Gross per 24 hour  Intake 1134.16 ml  Output    950 ml  Net 184.16 ml   Filed Weights   08/06/13 0400 08/07/13 0400 08/09/13 0500  Weight: 82.5 kg (181 lb 14.1 oz) 81.1 kg (178  lb 12.7 oz) 80.2 kg (176 lb 12.9 oz)    Exam:   General:  Patient is ill-appearing,  cachetic  Cardiovascular: Tachycardic, regular rate rhythm normal S1-S2  Respiratory: Positive bilateral rhonchi, expiratory wheezing, crackles. Mildly tachypnea, room air currently satting mid 90s.  Abdomen: Soft, nontender, nondistended  Musculoskeletal: Left lower extremity edema noted, mild localized erythema  Data Reviewed: Basic Metabolic Panel:  Recent Labs Lab 08/05/13 0716 08/05/13 0726 08/06/13 0526 08/07/13 0431 08/08/13 0645 08/09/13 0530  NA 129* 130* 132* 134* 135* 140  K 3.3* 3.2* 3.0* 2.6* 3.2* 3.5*  CL 95* 98 97 98 100 104  CO2 19  --  19 22 21 23   GLUCOSE 101* 96 85 95 162* 121*  BUN 16 14 14 11 15 17   CREATININE 1.11 1.20 0.96 0.83 0.97 1.02  CALCIUM 8.6  --  8.5 8.7 8.9 8.8  MG  --   --  1.9  --   --   --   PHOS  --   --  1.6*  --   --   --    Liver Function Tests:  Recent Labs Lab 08/05/13 0716 08/06/13 0526  AST 22 32  ALT 11 15  ALKPHOS 67 68  BILITOT 0.5 0.5  PROT 5.7* 5.5*  ALBUMIN 2.7* 2.4*   No results found for this basename: LIPASE, AMYLASE,  in the last 168 hours No results found for this basename: AMMONIA,  in the last 168 hours CBC:  Recent Labs Lab 08/05/13 0716 08/05/13 0726 08/06/13 0526 08/07/13 0431 08/08/13 0645 08/09/13 0530  WBC 0.1*  --  0.3* 1.4* 3.0* 3.4*  NEUTROABS  --   --  0.2*  --  2.5  --   HGB 6.6* 6.8* 6.9* 9.3* 9.8* 9.1*  HCT 19.2* 20.0* 19.8* 26.3* 28.1* 26.5*  MCV 79.7  --  79.5 78.3 78.5 79.1  PLT 5*  --  10* 14* 9* 44*   Cardiac Enzymes: No results found for this basename: CKTOTAL, CKMB, CKMBINDEX, TROPONINI,  in the last 168 hours BNP (last 3 results) No results found for this basename: PROBNP,  in the last 8760 hours CBG: No results found for this basename: GLUCAP,  in the last 168 hours  Recent Results (from the past 240 hour(s))  CULTURE, BLOOD (ROUTINE X 2)     Status: None   Collection Time    08/05/13  8:52 AM      Result Value Ref Range Status   Specimen Description BLOOD RIGHT  CHEST   Final   Special Requests BOTTLES DRAWN AEROBIC AND ANAEROBIC 5ML   Final   Culture  Setup Time     Final   Value: 08/05/2013 10:17     Performed at [facility]   Culture     Final   Value:        BLOOD CULTURE RECEIVED NO GROWTH TO DATE CULTURE WILL BE HELD FOR 5 DAYS BEFORE ISSUING A FINAL NEGATIVE REPORT     Performed at [facility]   Report Status PENDING   Incomplete  URINE CULTURE     Status: None   Collection Time    08/05/13  8:54 AM      Result Value Ref Range Status   Specimen Description URINE, CLEAN CATCH   Final   Special Requests NONE   Final   Culture  Setup Time     Final   Value: 08/05/2013 12:58     Performed at [facility]   Colony Count     Final   Value: >=100,000 COLONIES/ML     Performed at [facility]     Final   Value: ENTEROCOCCUS SPECIES     Performed at [facility]   Report Status 08/07/2013 FINAL   Final   Organism ID, Bacteria ENTEROCOCCUS SPECIES   Final  CULTURE, BLOOD (ROUTINE X 2)     Status: None   Collection Time    08/05/13  9:00 AM      Result Value Ref Range Status   Specimen Description BLOOD LEFT HAND   Final   Special Requests BOTTLES DRAWN AEROBIC AND ANAEROBIC 4CC   Final   Culture  Setup Time     Final   Value: 08/05/2013 10:17     Performed at [facility]   Culture     Final   Value:        BLOOD CULTURE RECEIVED NO GROWTH TO DATE CULTURE WILL BE HELD FOR 5 DAYS BEFORE ISSUING A FINAL NEGATIVE REPORT     Performed at [facility]   Report Status PENDING   Incomplete  MRSA PCR SCREENING     Status: Abnormal   Collection Time    08/05/13  4:14 PM      Result Value Ref Range Status   MRSA by PCR POSITIVE (*) NEGATIVE Final   Comment:            The GeneXpert MRSA Assay (FDA     approved for NASAL specimens     only), is one component of a     comprehensive MRSA colonization     surveillance program. It is not     intended to diagnose MRSA      infection nor to guide or     monitor treatment for     MRSA infections.     RESULT CALLED TO, READ BACK BY AND VERIFIED WITH:     Marlou Sa RN [id number] 08/05/13 A NAVARRO     Studies: No results found.  Scheduled Meds: . antiseptic oral rinse  15 mL Mouth Rinse BID  . Chlorhexidine Gluconate Cloth  6 each Topical Q0600  . feeding supplement (RESOURCE BREEZE)  1 Container Oral TID BM  . fluconazole (DIFLUCAN) IV  100 mg Intravenous Q24H  . ipratropium  0.5 mg Nebulization TID  . levalbuterol  0.63 mg Nebulization TID  . magic mouthwash  5 mL Oral QID  . multivitamin with minerals  1 tablet Oral Daily  . mupirocin ointment  1 application Nasal BID  . OxyCODONE  20 mg Oral Q12H  . pantoprazole  40 mg Oral Daily  . senna-docusate  1 tablet Oral BID  . simvastatin  20 mg Oral QPM  . vancomycin  1,250 mg Intravenous Q24H   Continuous Infusions:    Principal Problem:   Neutropenic fever Active Problems:   LYMPHOMA   HYPERLIPIDEMIA   SIRS (systemic inflammatory response syndrome)   Sepsis   Anemia   Leukopenia   Antineoplastic chemotherapy induced pancytopenia   Thrombocytopenia, unspecified   Hemoptysis   Cellulitis   Acute pulmonary embolism   Protein-calorie malnutrition, severe    Time spent: 35 minutes    Kelvin Cellar  [facility] Pager [phone]. If 7PM-7AM, please contact night-coverage at [email or web address], password [email or web address] 08/09/2013, 1:04 PM  LOS: 4 days

## 2013-08-09 NOTE — Progress Notes (Signed)
Speech Language Pathology Treatment: Dysphagia  Patient Details Name: Jack [name] MRN: [id number] DOB: [birth year] Today's Date: 08/09/2013 Time: 1350-1410 SLP Time Calculation (min): 20 min  Assessment / Plan / Recommendation Clinical Impression  SLP returned to room following MBS. Daughter present. Patient forcefully requesting "water" and "ice chips."  SLP provided and strong cough response noted consistent with performance this am at bedside and MBS results. Thorough education complete with daughter regarding results of study and potential plan including thickened liquids vs comfort feeds. Given demands for thin liquids this treatment session, unclear if patient will be happy with thickened liquids, increasing risk for dehydration/malnutrition. Following dicussion, decision made to trial thickened liquids with family/SLP to assess for increased comfort during po intake and overall tolerance. Patient may continue to have thin liquids per daughter request. SLP will continue to f/u for further decision making. Although daughter reports no difficulty with swallow prior to admission, SLP suspects some degree of pre-morbid dysphagia given severity of deficits noted on todays exam, likely now exacerbated by acute deconditioning and MD suspected thrush.     HPI HPI: Patient is a pleasant 80 year old man with a past medical history of stage II follicular lymphoma that was diagnosed in 2007, currently undergoing salvage chemotherapy with ifosfamide, carboplatin, etoposide and Rituxan at the [facility]. He presented to the emergency room on 08/05/2013 with complaints of cough, fevers, sputum production as well as minimal amount of hemoptysis. Symptoms had worsened over the past 2-3 days. Initial lab work in the emergency room showed a white count of 0.1 with hemoglobin of 6.6 and platelet count of 5. Patient was seen and evaluated by his medical oncologist Dr Alen Blew. He was transfused with platelets  overnight. He was also started on broad-spectrum IV antibiotic therapy with vancomycin, aztreonam and Levaquin. Acute diagnosis include relapsed diffuse large cell lymphoma presented with abdominal adenopathy and lower extremity edema, pancytopenia related to systemic chemotherapy, neutropenic sepsis, small pulmonary embolus.       SLP Plan  Continue with current plan of care    Recommendations Diet recommendations: Dysphagia 3 (mechanical soft);Thin liquid (may have thickened liquids per family wishes (nectar)) Liquids provided via: Cup;No straw Medication Administration: Whole meds with puree Supervision: Patient able to self feed;Full supervision/cueing for compensatory strategies Compensations: Small sips/bites;Slow rate;Multiple dry swallows after each bite/sip Postural Changes and/or Swallow Maneuvers: Seated upright 90 degrees;Upright 30-60 min after meal              Oral Care Recommendations: Oral care BID Follow up Recommendations:  (TBD) Plan: Continue with current plan of care    [place], [place] [phone]   Jack [name] 08/09/2013, 3:35 PM

## 2013-08-09 NOTE — Evaluation (Signed)
Clinical/Bedside Swallow Evaluation Patient Details  Name: Jack [name] MRN: [id number] Date of Birth: Apr 01, 1929  Today's Date: 08/09/2013 Time: 1120-1140 SLP Time Calculation (min): 20 min  Past Medical History:  Past Medical History  Diagnosis Date  . GERD (gastroesophageal reflux disease)   . Osteoarthritis   . Alcohol abuse     in recovery 2006  . 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 wine only  . Irritable bowel   . Hyperlipidemia   . Insomnia   . PAC (premature atrial contraction)     stable (ruled out for a fib)  . Prostatitis     chronic bacterial  . Pseudogout   . Sinus problem     chronic  . Cellulitis     2nd to T. Pedis(not at present)  . Hearing loss 03-10-12    bilateral hearing aids  . Complication of anesthesia 03-10-12    after shoulder  surgery -difficulty awakening,breathing problems  . Emphysema 03-10-12    tx. inhalers, uses steam room frequently  . Cramping of feet 03-10-12    cramping of both legs and hands occ.  . Osteoarthritis of left knee 03/17/2012  . Glaucoma   . Macular degeneration   . Hypertension   . Shortness of breath   . Non Hodgkin's lymphoma 03-10-12     '07-1 yr. in remission([facility])-not seeing now   Past Surgical History:  Past Surgical History  Procedure Laterality Date  . Esophagogastroduodenoscopy  01-27-02  . Inguinal hernia repair  1979  . Torn biceps repair  03-10-12    Left rotator cuff repair  . Hernia repair    . Cataract surgery  03-10-12    03-04-12(right)/ (03-11-12-left)  . Total knee arthroplasty  03/17/2012    Procedure: TOTAL KNEE ARTHROPLASTY;  Surgeon: Johnny Bridge, MD;  Location: [facility];  Service: Orthopedics;  Laterality: Left;  Marland Kitchen Eye surgery  2014    cataract extraction with IOL both eyes staged  . Colonoscopy    . Rotator cuff repair Left   . Total knee arthroplasty Right 03/22/2013    Procedure: TOTAL KNEE ARTHROPLASTY;  Surgeon: Johnny Bridge, MD;  Location: [facility];  Service: Orthopedics;  Laterality: Right;  righ total knee arthroplasty   HPI:  Patient is a pleasant 80 year old man with a past medical history of stage II follicular lymphoma that was diagnosed in 2007, currently undergoing salvage chemotherapy with ifosfamide, carboplatin, etoposide and Rituxan at the [facility]. He presented to the emergency room on 08/05/2013 with complaints of cough, fevers, sputum production as well as minimal amount of hemoptysis. Symptoms had worsened over the past 2-3 days. Initial lab work in the emergency room showed a white count of 0.1 with hemoglobin of 6.6 and platelet count of 5. Patient was seen and evaluated by his medical oncologist Dr Alen Blew. He was transfused with platelets overnight. He was also started on broad-spectrum IV antibiotic therapy with vancomycin, aztreonam and Levaquin. Acute diagnosis include relapsed diffuse large cell lymphoma presented with abdominal adenopathy and lower extremity edema, pancytopenia related to systemic chemotherapy, neutropenic sepsis, small pulmonary embolus.    Assessment / Plan / Recommendation Clinical Impression  Patient presents with overt indication of decreased airway protection across consistencies, thin worse than thicker liquids and solids. Question origin however; diagnosed with oral thrush which is likely present in pharynx and esophagus and may be cause of sore throat/dysphonia as well as severe deconditioning. Discussed in detail with patient and  daughter including options on how to proceed; objective testing vs modified diet vs pleasure pos. Patient wishes to proceed with objective testing in hopes of determining if diet texture changes or compensatory strategies may decrease aspiration risk and increase comfort with po intake. Will proceed with MBS.     Aspiration Risk  Moderate    Diet Recommendation Dysphagia 3 (Mechanical Soft);Thin liquid   Liquid Administration via: Cup Medication Administration:  Crushed with puree Supervision: Patient able to self feed;Full supervision/cueing for compensatory strategies Compensations: Small sips/bites;Slow rate Postural Changes and/or Swallow Maneuvers: Seated upright 90 degrees;Upright 30-60 min after meal    Other  Recommendations Recommended Consults: MBS Oral Care Recommendations: Oral care BID   Follow Up Recommendations   (TBD)    Frequency and Duration        Pertinent Vitals/Pain n/a     Swallow Study    General HPI: Patient is a pleasant 80 year old man with a past medical history of stage II follicular lymphoma that was diagnosed in 2007, currently undergoing salvage chemotherapy with ifosfamide, carboplatin, etoposide and Rituxan at the [facility]. He presented to the emergency room on 08/05/2013 with complaints of cough, fevers, sputum production as well as minimal amount of hemoptysis. Symptoms had worsened over the past 2-3 days. Initial lab work in the emergency room showed a white count of 0.1 with hemoglobin of 6.6 and platelet count of 5. Patient was seen and evaluated by his medical oncologist Dr Alen Blew. He was transfused with platelets overnight. He was also started on broad-spectrum IV antibiotic therapy with vancomycin, aztreonam and Levaquin. Acute diagnosis include relapsed diffuse large cell lymphoma presented with abdominal adenopathy and lower extremity edema, pancytopenia related to systemic chemotherapy, neutropenic sepsis, small pulmonary embolus.  Type of Study: Bedside swallow evaluation Previous Swallow Assessment: none Diet Prior to this Study: Dysphagia 3 (soft);Thin liquids Temperature Spikes Noted: No Respiratory Status: Room air History of Recent Intubation: No Behavior/Cognition: Alert;Cooperative;Pleasant mood Oral Cavity - Dentition: Adequate natural dentition Self-Feeding Abilities: Able to feed self Patient Positioning: Upright in chair Baseline Vocal Quality: Hoarse;Breathy;Low vocal  intensity Volitional Cough: Weak Volitional Swallow: Able to elicit    Oral/Motor/Sensory Function Overall Oral Motor/Sensory Function: Appears within functional limits for tasks assessed (per daughter, diagnosed with thrush)   Ice Chips Ice chips: Not tested   Thin Liquid Thin Liquid: Impaired Presentation: Cup;Self Fed Pharyngeal  Phase Impairments: Multiple swallows;Cough - Immediate;Cough - Delayed;Throat Clearing - Immediate;Throat Clearing - Delayed    Nectar Thick Nectar Thick Liquid: Impaired Pharyngeal Phase Impairments: Multiple swallows;Cough - Immediate;Cough - Delayed;Throat Clearing - Immediate;Throat Clearing - Delayed (decreased vs thin liquids)   Honey Thick Honey Thick Liquid: Not tested   Puree Puree: Impaired Presentation: Spoon Pharyngeal Phase Impairments: Cough - Immediate;Cough - Delayed   Solid   GO    Solid: Impaired Presentation: Spoon Pharyngeal Phase Impairments: Cough - Delayed      Clarine Elrod MA, CCC-SLP [phone]  Nour Scalise Meryl 08/09/2013,11:46 AM

## 2013-08-09 NOTE — Procedures (Signed)
Objective Swallowing Evaluation: Modified Barium Swallowing Study  Patient Details  Name: Jack [name] MRN: [id number] Date of Birth: 1929/04/17  Today's Date: 08/09/2013 Time: 9379-0240 SLP Time Calculation (min): 20 min  Past Medical History:  Past Medical History  Diagnosis Date  . GERD (gastroesophageal reflux disease)   . Osteoarthritis   . Alcohol abuse     in recovery 2006  . 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 wine only  . Irritable bowel   . Hyperlipidemia   . Insomnia   . PAC (premature atrial contraction)     stable (ruled out for a fib)  . Prostatitis     chronic bacterial  . Pseudogout   . Sinus problem     chronic  . Cellulitis     2nd to T. Pedis(not at present)  . Hearing loss 03-10-12    bilateral hearing aids  . Complication of anesthesia 03-10-12    after shoulder  surgery -difficulty awakening,breathing problems  . Emphysema 03-10-12    tx. inhalers, uses steam room frequently  . Cramping of feet 03-10-12    cramping of both legs and hands occ.  . Osteoarthritis of left knee 03/17/2012  . Glaucoma   . Macular degeneration   . Hypertension   . Shortness of breath   . Non Hodgkin's lymphoma 03-10-12     '07-1 yr. in remission([facility])-not seeing now   Past Surgical History:  Past Surgical History  Procedure Laterality Date  . Esophagogastroduodenoscopy  01-27-02  . Inguinal hernia repair  1979  . Torn biceps repair  03-10-12    Left rotator cuff repair  . Hernia repair    . Cataract surgery  03-10-12    03-04-12(right)/ (03-11-12-left)  . Total knee arthroplasty  03/17/2012    Procedure: TOTAL KNEE ARTHROPLASTY;  Surgeon: Johnny Bridge, MD;  Location: [facility];  Service: Orthopedics;  Laterality: Left;  Marland Kitchen Eye surgery  2014    cataract extraction with IOL both eyes staged  . Colonoscopy    . Rotator cuff repair Left   . Total knee arthroplasty Right 03/22/2013    Procedure: TOTAL KNEE ARTHROPLASTY;  Surgeon:  Johnny Bridge, MD;  Location: [place];  Service: Orthopedics;  Laterality: Right;  righ total knee arthroplasty   HPI:  Patient is a pleasant 80 year old man with a past medical history of stage II follicular lymphoma that was diagnosed in 2007, currently undergoing salvage chemotherapy with ifosfamide, carboplatin, etoposide and Rituxan at the [facility]. He presented to the emergency room on 08/05/2013 with complaints of cough, fevers, sputum production as well as minimal amount of hemoptysis. Symptoms had worsened over the past 2-3 days. Initial lab work in the emergency room showed a white count of 0.1 with hemoglobin of 6.6 and platelet count of 5. Patient was seen and evaluated by his medical oncologist Dr Alen Blew. He was transfused with platelets overnight. He was also started on broad-spectrum IV antibiotic therapy with vancomycin, aztreonam and Levaquin. Acute diagnosis include relapsed diffuse large cell lymphoma presented with abdominal adenopathy and lower extremity edema, pancytopenia related to systemic chemotherapy, neutropenic sepsis, small pulmonary embolus.      Assessment / Plan / Recommendation Clinical Impression  Dysphagia Diagnosis: Moderate pharyngeal phase dysphagia;Severe pharyngeal phase dysphagia Clinical impression: Patient presents with a moderate-severe oropharyngeal dysphagia with both sensory and motor components. Delayed swallow initiation combined with laryngeal weakness results in decreased airway protection with thin liquids. Moderate amounts of aspiration noted,  initially with strong cough response however subsequently silent, not clearing despite cueing for throat clearing, coughing, or prevented despite max cues for use of chin tuck which patient had a difficulty time coordinating. Patient better able to protect airway with nectar thick liquids and solid pos trials however mild-moderate pharyngeal residuals noted post swallow (primarily in the vallecula) which  patient is unaware of, further increasing aspiration risk.     Treatment Recommendation  Therapy as outlined in treatment plan below    Diet Recommendation Dysphagia 3 (Mechanical Soft);Thin liquid (family to provide thickened liquids as they wish)   Liquid Administration via: Cup;No straw Medication Administration: Whole meds with puree Supervision: Patient able to self feed;Full supervision/cueing for compensatory strategies Compensations: Small sips/bites;Slow rate;Multiple dry swallows after each bite/sip Postural Changes and/or Swallow Maneuvers: Seated upright 90 degrees;Upright 30-60 min after meal    Other  Recommendations Recommended Consults: MBS Oral Care Recommendations: Oral care BID   Follow Up Recommendations   (TBD)    Frequency and Duration min 3x week  2 weeks           General HPI: Patient is a pleasant 80 year old man with a past medical history of stage II follicular lymphoma that was diagnosed in 2007, currently undergoing salvage chemotherapy with ifosfamide, carboplatin, etoposide and Rituxan at the [facility]. He presented to the emergency room on 08/05/2013 with complaints of cough, fevers, sputum production as well as minimal amount of hemoptysis. Symptoms had worsened over the past 2-3 days. Initial lab work in the emergency room showed a white count of 0.1 with hemoglobin of 6.6 and platelet count of 5. Patient was seen and evaluated by his medical oncologist Dr Alen Blew. He was transfused with platelets overnight. He was also started on broad-spectrum IV antibiotic therapy with vancomycin, aztreonam and Levaquin. Acute diagnosis include relapsed diffuse large cell lymphoma presented with abdominal adenopathy and lower extremity edema, pancytopenia related to systemic chemotherapy, neutropenic sepsis, small pulmonary embolus.  Type of Study: Modified Barium Swallowing Study Reason for Referral: Objectively evaluate swallowing function Previous Swallow  Assessment: none Diet Prior to this Study: Dysphagia 3 (soft);Thin liquids Temperature Spikes Noted: No Respiratory Status: Room air History of Recent Intubation: No Behavior/Cognition: Alert;Cooperative;Pleasant mood Oral Cavity - Dentition: Adequate natural dentition Oral Motor / Sensory Function: Within functional limits Self-Feeding Abilities: Able to feed self Patient Positioning: Upright in chair Baseline Vocal Quality: Hoarse;Breathy;Low vocal intensity Volitional Cough: Weak Volitional Swallow: Able to elicit Anatomy: Within functional limits Pharyngeal Secretions: Not observed secondary MBS    Reason for Referral Objectively evaluate swallowing function   Oral Phase Oral Preparation/Oral Phase Oral Phase: WFL   Pharyngeal Phase Pharyngeal Phase Pharyngeal Phase: Impaired Pharyngeal - Nectar Pharyngeal - Nectar Cup: Delayed swallow initiation;Premature spillage to pyriform sinuses;Reduced anterior laryngeal mobility;Reduced laryngeal elevation;Reduced tongue base retraction;Pharyngeal residue - valleculae;Pharyngeal residue - pyriform sinuses Pharyngeal - Thin Pharyngeal - Thin Teaspoon: Delayed swallow initiation;Premature spillage to pyriform sinuses;Reduced anterior laryngeal mobility;Reduced laryngeal elevation;Reduced tongue base retraction;Pharyngeal residue - valleculae;Pharyngeal residue - pyriform sinuses;Penetration/Aspiration before swallow;Penetration/Aspiration during swallow;Penetration/Aspiration after swallow;Compensatory strategies attempted (Comment);Moderate aspiration (chin tuck ineffective) Penetration/Aspiration details (thin teaspoon): Material enters airway, passes BELOW cords without attempt by patient to eject out (silent aspiration) Pharyngeal - Thin Cup: Delayed swallow initiation;Premature spillage to pyriform sinuses;Reduced anterior laryngeal mobility;Reduced laryngeal elevation;Reduced tongue base retraction;Pharyngeal residue - valleculae;Pharyngeal  residue - pyriform sinuses;Penetration/Aspiration before swallow;Penetration/Aspiration during swallow;Penetration/Aspiration after swallow;Significant aspiration (Amount) Penetration/Aspiration details (thin cup): Material enters airway, passes BELOW cords and not ejected out despite  cough attempt by patient;Material enters airway, passes BELOW cords without attempt by patient to eject out (silent aspiration) Pharyngeal - Solids Pharyngeal - Puree: Delayed swallow initiation;Reduced anterior laryngeal mobility;Reduced laryngeal elevation;Reduced tongue base retraction;Pharyngeal residue - valleculae;Premature spillage to valleculae Pharyngeal - Mechanical Soft: Delayed swallow initiation;Reduced anterior laryngeal mobility;Reduced laryngeal elevation;Reduced tongue base retraction;Premature spillage to valleculae;Pharyngeal residue - valleculae Pharyngeal - Pill: Delayed swallow initiation;Reduced anterior laryngeal mobility;Reduced laryngeal elevation;Reduced tongue base retraction (patient partially masticated pill provided whole in puree)  Cervical Esophageal Phase    GO   Gabriel Rainwater MA, CCC-SLP [phone]  Cervical Esophageal Phase Cervical Esophageal Phase: [facility]         Jack [name] 08/09/2013, 3:33 PM

## 2013-08-09 NOTE — Progress Notes (Addendum)
Palliative Medicine Team Progress Note  Jack [name] was seen this morning by request of children after our initial conversation regarding goals of care. While his numbers look better and cells are recovering-he looks worse in my opinion-he is critically and chronically ill. He is barely able to speak to me, his voice is very horse, he is agitated and irritable-difficulty staying awake. He asks how he can adjourn our meeting and tells me he wants to talk to Dr. Crissie Sickles. His vocal quality is wet-he demands fluids but then chokes on them. He denies pain when I ask him. He cannot tell me if his throat hurts and he is largely uncooperative with me either intentionally or unintentionally-it is hard to tell from his behavior- he appears almost encephalopathic.  I have summarized the big picture for the patient and his family in terms of this is a much bigger issue than the cancer alone- he is now extremely debilitated, he has a PE that cant be treated, chemotherapy complications have been life threatening, he now has serious and severe dysphagia and nutrition will be challenging, he has an enterococcal UTI and prior cirrhosis.   Maintain current treatment course for now per patient -I spoke with Dr. Crissie Sickles who will see patient this evening if possible.   I will continue to follow.  Lane Hacker, DO Palliative Medicine

## 2013-08-10 ENCOUNTER — Other Ambulatory Visit: Payer: Medicare Other

## 2013-08-10 ENCOUNTER — Ambulatory Visit: Payer: Medicare Other

## 2013-08-10 ENCOUNTER — Ambulatory Visit: Payer: Medicare Other | Admitting: Oncology

## 2013-08-10 DIAGNOSIS — C8299 Follicular lymphoma, unspecified, extranodal and solid organ sites: Secondary | ICD-10-CM

## 2013-08-10 DIAGNOSIS — Z515 Encounter for palliative care: Secondary | ICD-10-CM

## 2013-08-10 DIAGNOSIS — R52 Pain, unspecified: Secondary | ICD-10-CM

## 2013-08-10 DIAGNOSIS — R5381 Other malaise: Secondary | ICD-10-CM

## 2013-08-10 DIAGNOSIS — D703 Neutropenia due to infection: Secondary | ICD-10-CM

## 2013-08-10 DIAGNOSIS — R5383 Other fatigue: Secondary | ICD-10-CM

## 2013-08-10 LAB — BASIC METABOLIC PANEL
BUN: 27 mg/dL — AB (ref 6–23)
CO2: 20 meq/L (ref 19–32)
CREATININE: 1.24 mg/dL (ref 0.50–1.35)
Calcium: 8.7 mg/dL (ref 8.4–10.5)
Chloride: 105 mEq/L (ref 96–112)
GFR calc Af Amer: 61 mL/min — ABNORMAL LOW (ref >90)
GFR calc non Af Amer: 52 mL/min — ABNORMAL LOW (ref >90)
Glucose, Bld: 133 mg/dL — ABNORMAL HIGH (ref 70–99)
Potassium: 3.9 mEq/L (ref 3.7–5.3)
Sodium: 139 mEq/L (ref 137–147)

## 2013-08-10 LAB — CBC
HCT: 26.2 % — ABNORMAL LOW (ref 39.0–52.0)
Hemoglobin: 8.9 g/dL — ABNORMAL LOW (ref 13.0–17.0)
MCH: 27.1 pg (ref 26.0–34.0)
MCHC: 34 g/dL (ref 30.0–36.0)
MCV: 79.9 fL (ref 78.0–100.0)
PLATELETS: 30 10*3/uL — AB (ref 150–400)
RBC: 3.28 MIL/uL — ABNORMAL LOW (ref 4.22–5.81)
RDW: 16.5 % — ABNORMAL HIGH (ref 11.5–15.5)
WBC: 5.6 10*3/uL (ref 4.0–10.5)

## 2013-08-10 MED ORDER — RESOURCE THICKENUP CLEAR PO POWD
ORAL | Status: DC | PRN
  Filled 2013-08-10: qty 125

## 2013-08-10 MED ORDER — OXYCODONE HCL 5 MG PO TABS
10.0000 mg | ORAL_TABLET | Freq: Four times a day (QID) | ORAL | Status: DC | PRN
  Administered 2013-08-10 – 2013-08-12 (×5): 10 mg via ORAL
  Filled 2013-08-10 (×5): qty 2

## 2013-08-10 MED ORDER — SODIUM CHLORIDE 0.9 % IV SOLN
INTRAVENOUS | Status: DC
  Administered 2013-08-10 – 2013-08-13 (×5): via INTRAVENOUS

## 2013-08-10 NOTE — Progress Notes (Signed)
Speech Language Pathology Treatment: Dysphagia  Patient Details Name: Jack [name] MRN: [id number] DOB: [birth year] Today's Date: 08/10/2013 Time: 1200-1230 SLP Time Calculation (min): 30 min  Assessment / Plan / Recommendation Clinical Impression  Per RN, pt "doing the best he can" re: diet tolerance.  Pt has poor appetite, and it is painful for him to swallow due to thrush/mucositis.  Pt observed drinking nectar thick Coke.  Cues required to take small boluses, and swallow 2x per bolus.  Family had questions re: what was allowed on dys 3 diet - specifically fruit.  Bananas, Strawberries, ripe pears and peaches are ok. Clarified with RN that these were allowed on his current diet.  V-8, tomato juice are also appropriate, as they are nectar thick liquids.  MD in for visit while SLP in the room.  Pt was encouraged by MD that oral discomfort should improve.  Anticipate swallow function to improve as well.  ST to continue to follow for diet tolerance, and possibility of repeat objective study.     HPI HPI: Patient is a pleasant 80 year old man with a past medical history of stage II follicular lymphoma that was diagnosed in 2007, currently undergoing salvage chemotherapy with ifosfamide, carboplatin, etoposide and Rituxan at the [facility]. He presented to the emergency room on 08/05/2013 with complaints of cough, fevers, sputum production as well as minimal amount of hemoptysis. Symptoms had worsened over the past 2-3 days. Initial lab work in the emergency room showed a white count of 0.1 with hemoglobin of 6.6 and platelet count of 5. Patient was seen and evaluated by his medical oncologist Dr Jack [name]. He was transfused with platelets overnight. He was also started on broad-spectrum IV antibiotic therapy with vancomycin, aztreonam and Levaquin. Acute diagnosis include relapsed diffuse large cell lymphoma presented with abdominal adenopathy and lower extremity edema, pancytopenia related to systemic  chemotherapy, neutropenic sepsis, small pulmonary embolus.    Pertinent Vitals VSS  SLP Plan  Continue with current plan of care    Recommendations Diet recommendations: Dysphagia 3 (mechanical soft);Nectar-thick liquid Liquids provided via: Cup;No straw Medication Administration: Whole meds with puree Supervision: Patient able to self feed;Full supervision/cueing for compensatory strategies Compensations: Small sips/bites;Slow rate;Multiple dry swallows after each bite/sip Postural Changes and/or Swallow Maneuvers: Seated upright 90 degrees;Upright 30-60 min after meal              Oral Care Recommendations: Oral care BID Follow up Recommendations: 24 hour supervision/assistance Plan: Continue with current plan of care    [place] B. Quentin Ore [facility], CCC-SLP [phone]   Shonna Chock 08/10/2013, 12:49 PM

## 2013-08-10 NOTE — Progress Notes (Signed)
ANTIBIOTIC CONSULT NOTE - FOLLOW UP  Pharmacy Consult for vancomycin Indication: sepsis  Allergies  Allergen Reactions  . Amoxicillin     REACTION: rash  . Latex Rash  . Penicillins Rash    Patient Measurements: Height: 5\' 11"  (180.3 cm) Weight: 176 lb 12.9 oz (80.2 kg) IBW/kg (Calculated) : 75.3   Vital Signs: Temp: 96.7 F (35.9 C) (02/17 0510) Temp src: Oral (02/17 0510) BP: 131/83 mmHg (02/17 0510) Pulse Rate: 114 (02/17 1335) Intake/Output from previous day: 02/16 0701 - 02/17 0700 In: 300 [IV Piggyback:300] Out: -  Intake/Output from this shift: Total I/O In: 120 [P.O.:120] Out: -   Labs:  Recent Labs  08/08/13 0645 08/09/13 0530 08/10/13 0540  WBC 3.0* 3.4* 5.6  HGB 9.8* 9.1* 8.9*  PLT 9* 44* 30*  CREATININE 0.97 1.02 1.24   Estimated CrCl approximately 50 mL/min  Estimated Creatinine Clearance: 48.9 ml/min (by C-G formula based on Cr of 1.24).  Recent Labs  08/07/13 2145  VANCOTROUGH 23.9*     Assessment: 80 y/o M on chemotherapy (R-ICE) for Non-Hodgkin's lymphoma, received first cycle 2/3 - 2/6, was brought to ED 2/12 with weakness, confusion, cough, sore throat, and found to be pancytopenic. Empiric antibiotics were ordered for suspected sepsis and pharmacy assistance with dosing was requested. Allergic to PCN (rash).   Antiinfectives D6 IV antibiotics 2/12 >>aztreonam >> 2/16 2/12>>vancomycin >>  2/12>>levofloxacin >> 2/16 2/14 >> Fluconazole (MD) >>   Tmax: improved to 99 WBCs: Recovering, today 5.6K Renal: SCr trending up 1.24 today, CrCl ~ 48CG, 46N  Microbiology 2/12 blood x2: NGTD 2/12 urine: >100K enterococcus sp (S amp, nitro, vanc, R levo, tetracyclines) 2/12 MRSA PCR (+)   Goal of Therapy:  Appropriate antibiotic dosing for renal function; eradication of infection Vancomycin trough 15-20  Plan:  - Continue Vancomycin at 1250mg  IV q24h - vancomycin trough tomorrow PM if continued - follow-up clinical course,  culture results, renal function - follow-up antibiotic de-escalation and length of therapy  Thank you for the consult.  Johny Drilling, PharmD, BCPS Pager: [phone] Pharmacy: [phone] 08/10/2013 3:45 PM

## 2013-08-10 NOTE — Progress Notes (Addendum)
[facility] PROGRESS NOTE  DEMARCUS THIELKE ZOX:[id number] DOB: December 08, 1928 DOA: 08/05/2013 PCP: Adella Hare, MD  Interim Summary Patient is a pleasant 80 year old man with a past medical history of stage II follicular lymphoma that was diagnosed in 2007, currently undergoing salvage chemotherapy with ifosfamide, carboplatin, etoposide and Rituxan at the [facility]. He presented to the emergency room on 08/05/2013 with complaints of cough, fevers, sputum production as well as minimal amount of hemoptysis. Symptoms had worsened over the 2-3 days prior to admission. Initial lab work in the emergency room showed a white count of 0.1 with hemoglobin of 6.6 and platelet count of 5,000. He was admitted to the step down unit where he was started on broad-spectrum IV antibiotic therapy with vancomycin, aztreonam and Levaquin. Initial chest x-ray did not reveal acute cardiopulmonary disease. This was followed by a CT scan of lungs with IV contrast which revealed the presence of small pulmonary embolism incompletely obstructing the left lower lobe superior segment pulmonary artery. Also notable is a new nodular opacity in the left perihilar region that measured 8.6 mm with radiology recommended a followup study in 10-12 weeks. Given profound thrombocytopenia and pancytopenia secondary to chemotherapy it was felt that the risks of anticoagulation outweigh the benefits. During this hospitalization he was transfused with both platelets and packed red blood cells. Patient was seen and evaluated by his medical oncologist Dr Alen Blew. Dr. [name] a palliative care was consulted for assistance with the goals of care and pain management. Patient stabilized and on 08/09/2013 he was transferred out of the step down unit to the MedSurg floor. Given concerns for aspiration speech pathology was consulted and found to have moderate to severe oropharyngeal dysphasia. Speech pathology recommended dysphasia 3 diet with thin  liquids.  Assessment/Plan: 1. Sepsis, present on admission, evidenced by white count of 0.1, heart rate of 121, with presence of metabolic encephalopathy. Blood cultures remain negative however urine growing Enterococcus species. Organism susceptible to Vancomycin and Ampicillin. Will continue Vancomycin therapy. Blood cultures negative x 2 sets. Neutropenia resolved, afebrile, he received 3 days of triple antibiotic coverage.  2. Neutropenic fever. Patient with history of follicular lymphoma, undergoing chemotherapy at the [place], presented with a white count of 0.1. Now Resolving.  3. Pancytopenia secondary to systemic chemotherapy. Patient transfused with 2 units of PRBC's  and 2 units of PLT's on 08/06/13. Hg stable however platelet count down to 9,000. Transfuse 3 units of platelets on 08/08/13. Labs improving this AM. Patient no longer neutropenic. Platelet count currently at 30,000.    4. Urinary Tract Infection. Urine cultures growing enterococcus species, organism susceptible to Vancomycin and Ampicillin. He is currently on Vancomycin.  5. Acute renal failure. A.m. lab work showing upward trend and creatinine to 1.27. I suspect secondary to prerenal azotemia, will start IV fluids with normal saline at 75 mL per hour 6. Pulmonary embolism. He had a CT scan of lungs with contrast that showed a small pulmonary embolus and completely obstructing the left lower lobe superior segment pulmonary artery. He is hemodynamically stable, not an anticoagulation candidate given significant anemia and thrombocytopenia in setting of chemotherapy. Risks of anticoagulation outweigh benefits.  7. Hypokalemic. AM labs showing potassium of 2.6 on 08/07/13, improved to 3.9 by 08/10/13.   8. Hyponatremia. Labs showed improving sodium on 08/09/13 at 140.  9. Stage II follicular lymphoma. Patient had been on salvage chemotherapy at the [facility] with ifosphamide, carboplatin, etoposide, and rituxan. Care consulted  for goals of care. After further  discussion over night regarding CODE STATUS with his daughters at bedside, patient made a DO NOT RESUSCITATE per his wishes.   Code Status: DNR Family Communication: I spoke with his daughters present at bedside Disposition Plan: Continue supportive care, physical therapy consult.     Consultants:  Medical Oncology  Antibiotics:  Vancomycin (started on 08/05/2013)  Aztreonam (started on 08/05/2013 discontinued on 08/09/13)  Levaquin (started on 08/05/2013 discontinued on 08/09/13)  HPI/Subjective:   Appears ill although labs showing improvement. He has been helped out of bed to chair several times.      Objective: Filed Vitals:   08/10/13 1335  BP:   Pulse: 114  Temp:   Resp:     Intake/Output Summary (Last 24 hours) at 08/10/13 1440 Last data filed at 08/10/13 1000  Gross per 24 hour  Intake    420 ml  Output      0 ml  Net    420 ml   Filed Weights   08/06/13 0400 08/07/13 0400 08/09/13 0500  Weight: 82.5 kg (181 lb 14.1 oz) 81.1 kg (178 lb 12.7 oz) 80.2 kg (176 lb 12.9 oz)    Exam:   General:  Patient is ill-appearing, cachetic  Cardiovascular: Tachycardic, regular rate rhythm normal S1-S2  Respiratory: Positive bilateral rhonchi, expiratory wheezing, crackles. Mildly tachypnea, room air currently satting mid 90s.  Abdomen: Soft, nontender, nondistended  Musculoskeletal: Left lower extremity edema noted, mild localized erythema  Data Reviewed: Basic Metabolic Panel:  Recent Labs Lab 08/05/13 0726 08/06/13 0526 08/07/13 0431 08/08/13 0645 08/09/13 0530 08/10/13 0540  NA 130* 132* 134* 135* 140 139  K 3.2* 3.0* 2.6* 3.2* 3.5* 3.9  CL 98 97 98 100 104 105  CO2  --  19 22 21 23 20   GLUCOSE 96 85 95 162* 121* 133*  BUN 14 14 11 15 17  27*  CREATININE 1.20 0.96 0.83 0.97 1.02 1.24  CALCIUM  --  8.5 8.7 8.9 8.8 8.7  MG  --  1.9  --   --   --   --   PHOS  --  1.6*  --   --   --   --    Liver Function  Tests:  Recent Labs Lab 08/05/13 0716 08/06/13 0526  AST 22 32  ALT 11 15  ALKPHOS 67 68  BILITOT 0.5 0.5  PROT 5.7* 5.5*  ALBUMIN 2.7* 2.4*   No results found for this basename: LIPASE, AMYLASE,  in the last 168 hours No results found for this basename: AMMONIA,  in the last 168 hours CBC:  Recent Labs Lab 08/06/13 0526 08/07/13 0431 08/08/13 0645 08/09/13 0530 08/10/13 0540  WBC 0.3* 1.4* 3.0* 3.4* 5.6  NEUTROABS 0.2*  --  2.5  --   --   HGB 6.9* 9.3* 9.8* 9.1* 8.9*  HCT 19.8* 26.3* 28.1* 26.5* 26.2*  MCV 79.5 78.3 78.5 79.1 79.9  PLT 10* 14* 9* 44* 30*   Cardiac Enzymes: No results found for this basename: CKTOTAL, CKMB, CKMBINDEX, TROPONINI,  in the last 168 hours BNP (last 3 results) No results found for this basename: PROBNP,  in the last 8760 hours CBG: No results found for this basename: GLUCAP,  in the last 168 hours  Recent Results (from the past 240 hour(s))  CULTURE, BLOOD (ROUTINE X 2)     Status: None   Collection Time    08/05/13  8:52 AM      Result Value Ref Range Status   Specimen Description BLOOD  RIGHT CHEST   Final   Special Requests BOTTLES DRAWN AEROBIC AND ANAEROBIC 5ML   Final   Culture  Setup Time     Final   Value: 08/05/2013 10:17     Performed at [facility]   Culture     Final   Value:        BLOOD CULTURE RECEIVED NO GROWTH TO DATE CULTURE WILL BE HELD FOR 5 DAYS BEFORE ISSUING A FINAL NEGATIVE REPORT     Performed at [facility]   Report Status PENDING   Incomplete  URINE CULTURE     Status: None   Collection Time    08/05/13  8:54 AM      Result Value Ref Range Status   Specimen Description URINE, CLEAN CATCH   Final   Special Requests NONE   Final   Culture  Setup Time     Final   Value: 08/05/2013 12:58     Performed at [place]     Final   Value: >=100,000 COLONIES/ML     Performed at [facility]   Culture     Final   Value: ENTEROCOCCUS SPECIES     Performed at  [facility]   Report Status 08/07/2013 FINAL   Final   Organism ID, Bacteria ENTEROCOCCUS SPECIES   Final  CULTURE, BLOOD (ROUTINE X 2)     Status: None   Collection Time    08/05/13  9:00 AM      Result Value Ref Range Status   Specimen Description BLOOD LEFT HAND   Final   Special Requests BOTTLES DRAWN AEROBIC AND ANAEROBIC 4CC   Final   Culture  Setup Time     Final   Value: 08/05/2013 10:17     Performed at [facility]   Culture     Final   Value:        BLOOD CULTURE RECEIVED NO GROWTH TO DATE CULTURE WILL BE HELD FOR 5 DAYS BEFORE ISSUING A FINAL NEGATIVE REPORT     Performed at [facility]   Report Status PENDING   Incomplete  MRSA PCR SCREENING     Status: Abnormal   Collection Time    08/05/13  4:14 PM      Result Value Ref Range Status   MRSA by PCR POSITIVE (*) NEGATIVE Final   Comment:            The GeneXpert MRSA Assay (FDA     approved for NASAL specimens     only), is one component of a     comprehensive MRSA colonization     surveillance program. It is not     intended to diagnose MRSA     infection nor to guide or     monitor treatment for     MRSA infections.     RESULT CALLED TO, READ BACK BY AND VERIFIED WITH:     Marlou Sa RN [id number] 08/05/13 A NAVARRO     Studies: Dg Swallowing Func-speech Pathology  08/09/2013   Earle Gell McCoy, CCC-SLP     08/09/2013  3:33 PM Objective Swallowing Evaluation: Modified Barium Swallowing Study   Patient Details  Name: MCCAIN BUCHBERGER MRN: [id number] Date of Birth: 1929-05-14  Today's Date: 08/09/2013 Time: [id number] SLP Time Calculation (min): 20 min  Past Medical History:  Past Medical History  Diagnosis Date  . GERD (gastroesophageal reflux disease)   . Osteoarthritis   .  Alcohol abuse     in recovery 2006  . 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 wine only  . Irritable bowel   . Hyperlipidemia   . Insomnia   . PAC (premature atrial contraction)     stable (ruled  out for a fib)  . Prostatitis     chronic bacterial  . Pseudogout   . Sinus problem     chronic  . Cellulitis     2nd to T. Pedis(not at present)  . Hearing loss 03-10-12    bilateral hearing aids  . Complication of anesthesia 03-10-12    after shoulder  surgery -difficulty awakening,breathing  problems  . Emphysema 03-10-12    tx. inhalers, uses steam room frequently  . Cramping of feet 03-10-12    cramping of both legs and hands occ.  . Osteoarthritis of left knee 03/17/2012  . Glaucoma   . Macular degeneration   . Hypertension   . Shortness of breath   . Non Hodgkin's lymphoma 03-10-12     '07-1 yr. in remission([facility])-not seeing now   Past Surgical History:  Past Surgical History  Procedure Laterality Date  . Esophagogastroduodenoscopy  01-27-02  . Inguinal hernia repair  1979  . Torn biceps repair  03-10-12    Left rotator cuff repair  . Hernia repair    . Cataract surgery  03-10-12    03-04-12(right)/ (03-11-12-left)  . Total knee arthroplasty  03/17/2012    Procedure: TOTAL KNEE ARTHROPLASTY;  Surgeon: Johnny Bridge,  MD;  Location: [facility];  Service: Orthopedics;  Laterality: Left;  [facility] Kitchen Eye surgery  2014    cataract extraction with IOL both eyes staged  . Colonoscopy    . Rotator cuff repair Left   . Total knee arthroplasty Right 03/22/2013    Procedure: TOTAL KNEE ARTHROPLASTY;  Surgeon: Johnny Bridge,  MD;  Location: [facility];  Service: Orthopedics;  Laterality: Right;   righ total knee arthroplasty   HPI:  Patient is a pleasant 80 year old man with a past medical history  of stage II follicular lymphoma that was diagnosed in 2007,  currently undergoing salvage chemotherapy with ifosfamide,  carboplatin, etoposide and Rituxan at the [facility]. He  presented to the emergency room on 08/05/2013 with complaints of  cough, fevers, sputum production as well as minimal amount of  hemoptysis. Symptoms had worsened over the past 2-3 days. Initial  lab work in the emergency room showed a white count of 0.1 with  hemoglobin of  6.6 and platelet count of 5. Patient was seen and  evaluated by his medical oncologist Dr Alen Blew. He was transfused  with platelets overnight. He was also started on broad-spectrum  IV antibiotic therapy with vancomycin, aztreonam and Levaquin.  Acute diagnosis include relapsed diffuse large cell lymphoma  presented with abdominal adenopathy and lower extremity edema,  pancytopenia related to systemic chemotherapy, neutropenic  sepsis, small pulmonary embolus.      Assessment / Plan / Recommendation Clinical Impression  Dysphagia Diagnosis: Moderate pharyngeal phase dysphagia;Severe  pharyngeal phase dysphagia Clinical impression: Patient presents with a moderate-severe  oropharyngeal dysphagia with both sensory and motor components.  Delayed swallow initiation combined with laryngeal weakness  results in decreased airway protection with thin liquids.  Moderate amounts of aspiration noted, initially with strong cough  response however subsequently silent, not clearing despite cueing  for throat clearing, coughing, or prevented despite max cues for  use of chin tuck which patient had  a difficulty time  coordinating. Patient better able to protect airway with nectar  thick liquids and solid pos trials however mild-moderate  pharyngeal residuals noted post swallow (primarily in the  vallecula) which patient is unaware of, further increasing  aspiration risk.     Treatment Recommendation  Therapy as outlined in treatment plan below    Diet Recommendation Dysphagia 3 (Mechanical Soft);Thin liquid  (family to provide thickened liquids as they wish)   Liquid Administration via: Cup;No straw Medication Administration: Whole meds with puree Supervision: Patient able to self feed;Full supervision/cueing  for compensatory strategies Compensations: Small sips/bites;Slow rate;Multiple dry swallows  after each bite/sip Postural Changes and/or Swallow Maneuvers: Seated upright 90  degrees;Upright 30-60 min after meal    Other   Recommendations Recommended Consults: MBS Oral Care Recommendations: Oral care BID   Follow Up Recommendations   (TBD)    Frequency and Duration min 3x week  2 weeks           General HPI: Patient is a pleasant 80 year old man with a past  medical history of stage II follicular lymphoma that was  diagnosed in 2007, currently undergoing salvage chemotherapy with  ifosfamide, carboplatin, etoposide and Rituxan at the [facility]. He presented to the emergency room on 08/05/2013 with  complaints of cough, fevers, sputum production as well as minimal  amount of hemoptysis. Symptoms had worsened over the past 2-3  days. Initial lab work in the emergency room showed a white count  of 0.1 with hemoglobin of 6.6 and platelet count of 5. Patient  was seen and evaluated by his medical oncologist Dr Alen Blew. He  was transfused with platelets overnight. He was also started on  broad-spectrum IV antibiotic therapy with vancomycin, aztreonam  and Levaquin. Acute diagnosis include relapsed diffuse large cell  lymphoma presented with abdominal adenopathy and lower extremity  edema, pancytopenia related to systemic chemotherapy, neutropenic  sepsis, small pulmonary embolus.  Type of Study: Modified Barium Swallowing Study Reason for Referral: Objectively evaluate swallowing function Previous Swallow Assessment: none Diet Prior to this Study: Dysphagia 3 (soft);Thin liquids Temperature Spikes Noted: No Respiratory Status: Room air History of Recent Intubation: No Behavior/Cognition: Alert;Cooperative;Pleasant mood Oral Cavity - Dentition: Adequate natural dentition Oral Motor / Sensory Function: Within functional limits Self-Feeding Abilities: Able to feed self Patient Positioning: Upright in chair Baseline Vocal Quality: Hoarse;Breathy;Low vocal intensity Volitional Cough: Weak Volitional Swallow: Able to elicit Anatomy: Within functional limits Pharyngeal Secretions: Not observed secondary MBS    Reason for Referral Objectively  evaluate swallowing function   Oral Phase Oral Preparation/Oral Phase Oral Phase: WFL   Pharyngeal Phase Pharyngeal Phase Pharyngeal Phase: Impaired Pharyngeal - Nectar Pharyngeal - Nectar Cup: Delayed swallow initiation;Premature  spillage to pyriform sinuses;Reduced anterior laryngeal  mobility;Reduced laryngeal elevation;Reduced tongue base  retraction;Pharyngeal residue - valleculae;Pharyngeal residue -  pyriform sinuses Pharyngeal - Thin Pharyngeal - Thin Teaspoon: Delayed swallow initiation;Premature  spillage to pyriform sinuses;Reduced anterior laryngeal  mobility;Reduced laryngeal elevation;Reduced tongue base  retraction;Pharyngeal residue - valleculae;Pharyngeal residue -  pyriform sinuses;Penetration/Aspiration before  swallow;Penetration/Aspiration during  swallow;Penetration/Aspiration after swallow;Compensatory  strategies attempted (Comment);Moderate aspiration (chin tuck  ineffective) Penetration/Aspiration details (thin teaspoon): Material enters  airway, passes BELOW cords without attempt by patient to eject  out (silent aspiration) Pharyngeal - Thin Cup: Delayed swallow initiation;Premature  spillage to pyriform sinuses;Reduced anterior laryngeal  mobility;Reduced laryngeal elevation;Reduced tongue base  retraction;Pharyngeal residue - valleculae;Pharyngeal residue -  pyriform sinuses;Penetration/Aspiration before  swallow;Penetration/Aspiration during  swallow;Penetration/Aspiration after swallow;Significant  aspiration (Amount)  Penetration/Aspiration details (thin cup): Material enters  airway, passes BELOW cords and not ejected out despite cough  attempt by patient;Material enters airway, passes BELOW cords  without attempt by patient to eject out (silent aspiration) Pharyngeal - Solids Pharyngeal - Puree: Delayed swallow initiation;Reduced anterior  laryngeal mobility;Reduced laryngeal elevation;Reduced tongue  base retraction;Pharyngeal residue - valleculae;Premature  spillage to valleculae  Pharyngeal - Mechanical Soft: Delayed swallow initiation;Reduced  anterior laryngeal mobility;Reduced laryngeal elevation;Reduced  tongue base retraction;Premature spillage to  valleculae;Pharyngeal residue - valleculae Pharyngeal - Pill: Delayed swallow initiation;Reduced anterior  laryngeal mobility;Reduced laryngeal elevation;Reduced tongue  base retraction (patient partially masticated pill provided whole  in puree)  Cervical Esophageal Phase    GO   Gabriel Rainwater MA, CCC-SLP [phone]  Cervical Esophageal Phase Cervical Esophageal Phase: [facility]         McCoy Leah Meryl 08/09/2013, 3:33 PM     Scheduled Meds: . antiseptic oral rinse  15 mL Mouth Rinse BID  . feeding supplement (RESOURCE BREEZE)  1 Container Oral TID BM  . fluconazole (DIFLUCAN) IV  100 mg Intravenous Q24H  . ipratropium  0.5 mg Nebulization TID  . levalbuterol  0.63 mg Nebulization TID  . magic mouthwash  5 mL Oral QID  . pantoprazole (PROTONIX) IV  40 mg Intravenous Q24H  . vancomycin  1,250 mg Intravenous Q24H   Continuous Infusions:    Principal Problem:   Neutropenic fever Active Problems:   LYMPHOMA   HYPERLIPIDEMIA   SIRS (systemic inflammatory response syndrome)   Sepsis   Anemia   Leukopenia   Antineoplastic chemotherapy induced pancytopenia   Thrombocytopenia, unspecified   Hemoptysis   Cellulitis   Acute pulmonary embolism   Protein-calorie malnutrition, severe    Time spent: 35 minutes    Kelvin Cellar  [facility] Pager [phone]. If 7PM-7AM, please contact night-coverage at [email or web address], password [facility] 08/10/2013, 2:40 PM  LOS: 5 days

## 2013-08-10 NOTE — Progress Notes (Signed)
Courtesy note: Spoke with daughter and patient 2/16/PM - reviewed events, current condition and plans. Re-enforced the wisdom of DNR/DNI status. Reviewed SLP evaluation and encouraged adherence to recommendations re: thickened liquids but also gave daughter support to allow comfort eating and drinking if Jack [name] was insistence.  He is still wanting to pursue treatment. Will continue to offer support but shared concerns about the risk of further complications and debilitation that will be associated with treatment. He has expressed a desire to be at home and will offer support in regard to a disposition to home. Will need to discuss full enrollment in Hospice when he is more cogent.   I appreciate the care being offered by the [facility] team, Dr. Alen Blew and [name].

## 2013-08-10 NOTE — Progress Notes (Signed)
IP PROGRESS NOTE  Subjective:   Patient is doing better. No fevers or chills. No bleeding noted. Still horse.  No fevers noted.   Objective:  Vital signs in last 24 hours: Temp:  [96.7 F (35.9 C)-99.4 F (37.4 C)] 96.7 F (35.9 C) (02/17 0510) Pulse Rate:  [103-111] 103 (02/17 0510) Resp:  [20-26] 20 (02/17 0510) BP: (112-131)/(77-88) 131/83 mmHg (02/17 0510) SpO2:  [95 %-99 %] 97 % (02/17 0833) Weight change:  Last BM Date: 08/05/13  Intake/Output from previous day: 02/16 0701 - 02/17 0700 In: 300 [IV Piggyback:300] Out: -   Mouth: mucous membranes moist, pharynx normal without lesions Resp: clear to auscultation bilaterally Cardio: regular rate and rhythm, S1, S2 normal, no murmur, click, rub or gallop GI: soft, non-tender; bowel sounds normal; no masses,  no organomegaly Extremities: extremities normal, atraumatic, no cyanosis. Left leg edema much improved.   Portacath without erythema  Lab Results:  Recent Labs  08/09/13 0530 08/10/13 0540  WBC 3.4* 5.6  HGB 9.1* 8.9*  HCT 26.5* 26.2*  PLT 44* 30*    BMET  Recent Labs  08/09/13 0530 08/10/13 0540  NA 140 139  K 3.5* 3.9  CL 104 105  CO2 23 20  GLUCOSE 121* 133*  BUN 17 27*  CREATININE 1.02 1.24  CALCIUM 8.8 8.7    Studies/Results: Dg Swallowing Func-speech Pathology  08/09/2013   Leah Meryl McCoy, CCC-SLP     08/09/2013  3:33 PM Objective Swallowing Evaluation: Modified Barium Swallowing Study   Patient Details  Name: Jack [name] MRN: [id number] Date of Birth: [birth year]  Today's Date: 08/09/2013 Time: 1914-7829 SLP Time Calculation (min): 20 min  Past Medical History:  Past Medical History  Diagnosis Date  . GERD (gastroesophageal reflux disease)   . Osteoarthritis   . Alcohol abuse     in recovery 2006  . 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 wine only  . Irritable bowel   . Hyperlipidemia   . Insomnia   . PAC (premature atrial contraction)     stable (ruled  out for a fib)  . Prostatitis     chronic bacterial  . Pseudogout   . Sinus problem     chronic  . Cellulitis     2nd to T. Pedis(not at present)  . Hearing loss 03-10-12    bilateral hearing aids  . Complication of anesthesia 03-10-12    after shoulder  surgery -difficulty awakening,breathing  problems  . Emphysema 03-10-12    tx. inhalers, uses steam room frequently  . Cramping of feet 03-10-12    cramping of both legs and hands occ.  . Osteoarthritis of left knee 03/17/2012  . Glaucoma   . Macular degeneration   . Hypertension   . Shortness of breath   . Non Hodgkin's lymphoma 03-10-12     '07-1 yr. in remission(Takiyah Bohnsack)-not seeing now   Past Surgical History:  Past Surgical History  Procedure Laterality Date  . Esophagogastroduodenoscopy  01-27-02  . Inguinal hernia repair  1979  . Torn biceps repair  03-10-12    Left rotator cuff repair  . Hernia repair    . Cataract surgery  03-10-12    03-04-12(right)/ (03-11-12-left)  . Total knee arthroplasty  03/17/2012    Procedure: TOTAL KNEE ARTHROPLASTY;  Surgeon: Johnny Bridge,  MD;  Location: [facility];  Service: Orthopedics;  Laterality: Left;  Marland Kitchen Eye surgery  2014    cataract extraction with IOL both  eyes staged  . Colonoscopy    . Rotator cuff repair Left   . Total knee arthroplasty Right 03/22/2013    Procedure: TOTAL KNEE ARTHROPLASTY;  Surgeon: Johnny Bridge,  MD;  Location: [place];  Service: Orthopedics;  Laterality: Right;   righ total knee arthroplasty   HPI:  Patient is a pleasant 80 year old man with a past medical history  of stage II follicular lymphoma that was diagnosed in 2007,  currently undergoing salvage chemotherapy with ifosfamide,  carboplatin, etoposide and Rituxan at the [facility]. He  presented to the emergency room on 08/05/2013 with complaints of  cough, fevers, sputum production as well as minimal amount of  hemoptysis. Symptoms had worsened over the past 2-3 days. Initial  lab work in the emergency room showed a white count of 0.1 with  hemoglobin of  6.6 and platelet count of 5. Patient was seen and  evaluated by his medical oncologist Dr Alen Blew. He was transfused  with platelets overnight. He was also started on broad-spectrum  IV antibiotic therapy with vancomycin, aztreonam and Levaquin.  Acute diagnosis include relapsed diffuse large cell lymphoma  presented with abdominal adenopathy and lower extremity edema,  pancytopenia related to systemic chemotherapy, neutropenic  sepsis, small pulmonary embolus.      Assessment / Plan / Recommendation Clinical Impression  Dysphagia Diagnosis: Moderate pharyngeal phase dysphagia;Severe  pharyngeal phase dysphagia Clinical impression: Patient presents with a moderate-severe  oropharyngeal dysphagia with both sensory and motor components.  Delayed swallow initiation combined with laryngeal weakness  results in decreased airway protection with thin liquids.  Moderate amounts of aspiration noted, initially with strong cough  response however subsequently silent, not clearing despite cueing  for throat clearing, coughing, or prevented despite max cues for  use of chin tuck which patient had a difficulty time  coordinating. Patient better able to protect airway with nectar  thick liquids and solid pos trials however mild-moderate  pharyngeal residuals noted post swallow (primarily in the  vallecula) which patient is unaware of, further increasing  aspiration risk.     Treatment Recommendation  Therapy as outlined in treatment plan below    Diet Recommendation Dysphagia 3 (Mechanical Soft);Thin liquid  (family to provide thickened liquids as they wish)   Liquid Administration via: Cup;No straw Medication Administration: Whole meds with puree Supervision: Patient able to self feed;Full supervision/cueing  for compensatory strategies Compensations: Small sips/bites;Slow rate;Multiple dry swallows  after each bite/sip Postural Changes and/or Swallow Maneuvers: Seated upright 90  degrees;Upright 30-60 min after meal    Other   Recommendations Recommended Consults: MBS Oral Care Recommendations: Oral care BID   Follow Up Recommendations   (TBD)    Frequency and Duration min 3x week  2 weeks           General HPI: Patient is a pleasant 80 year old man with a past  medical history of stage II follicular lymphoma that was  diagnosed in 2007, currently undergoing salvage chemotherapy with  ifosfamide, carboplatin, etoposide and Rituxan at the [facility]. He presented to the emergency room on 08/05/2013 with  complaints of cough, fevers, sputum production as well as minimal  amount of hemoptysis. Symptoms had worsened over the past 2-3  days. Initial lab work in the emergency room showed a white count  of 0.1 with hemoglobin of 6.6 and platelet count of 5. Patient  was seen and evaluated by his medical oncologist Dr Alen Blew. He  was transfused with platelets overnight. He was also started on  broad-spectrum IV antibiotic therapy with vancomycin, aztreonam  and Levaquin. Acute diagnosis include relapsed diffuse large cell  lymphoma presented with abdominal adenopathy and lower extremity  edema, pancytopenia related to systemic chemotherapy, neutropenic  sepsis, small pulmonary embolus.  Type of Study: Modified Barium Swallowing Study Reason for Referral: Objectively evaluate swallowing function Previous Swallow Assessment: none Diet Prior to this Study: Dysphagia 3 (soft);Thin liquids Temperature Spikes Noted: No Respiratory Status: Room air History of Recent Intubation: No Behavior/Cognition: Alert;Cooperative;Pleasant mood Oral Cavity - Dentition: Adequate natural dentition Oral Motor / Sensory Function: Within functional limits Self-Feeding Abilities: Able to feed self Patient Positioning: Upright in chair Baseline Vocal Quality: Hoarse;Breathy;Low vocal intensity Volitional Cough: Weak Volitional Swallow: Able to elicit Anatomy: Within functional limits Pharyngeal Secretions: Not observed secondary MBS    Reason for Referral Objectively  evaluate swallowing function   Oral Phase Oral Preparation/Oral Phase Oral Phase: WFL   Pharyngeal Phase Pharyngeal Phase Pharyngeal Phase: Impaired Pharyngeal - Nectar Pharyngeal - Nectar Cup: Delayed swallow initiation;Premature  spillage to pyriform sinuses;Reduced anterior laryngeal  mobility;Reduced laryngeal elevation;Reduced tongue base  retraction;Pharyngeal residue - valleculae;Pharyngeal residue -  pyriform sinuses Pharyngeal - Thin Pharyngeal - Thin Teaspoon: Delayed swallow initiation;Premature  spillage to pyriform sinuses;Reduced anterior laryngeal  mobility;Reduced laryngeal elevation;Reduced tongue base  retraction;Pharyngeal residue - valleculae;Pharyngeal residue -  pyriform sinuses;Penetration/Aspiration before  swallow;Penetration/Aspiration during  swallow;Penetration/Aspiration after swallow;Compensatory  strategies attempted (Comment);Moderate aspiration (chin tuck  ineffective) Penetration/Aspiration details (thin teaspoon): Material enters  airway, passes BELOW cords without attempt by patient to eject  out (silent aspiration) Pharyngeal - Thin Cup: Delayed swallow initiation;Premature  spillage to pyriform sinuses;Reduced anterior laryngeal  mobility;Reduced laryngeal elevation;Reduced tongue base  retraction;Pharyngeal residue - valleculae;Pharyngeal residue -  pyriform sinuses;Penetration/Aspiration before  swallow;Penetration/Aspiration during  swallow;Penetration/Aspiration after swallow;Significant  aspiration (Amount) Penetration/Aspiration details (thin cup): Material enters  airway, passes BELOW cords and not ejected out despite cough  attempt by patient;Material enters airway, passes BELOW cords  without attempt by patient to eject out (silent aspiration) Pharyngeal - Solids Pharyngeal - Puree: Delayed swallow initiation;Reduced anterior  laryngeal mobility;Reduced laryngeal elevation;Reduced tongue  base retraction;Pharyngeal residue - valleculae;Premature  spillage to valleculae  Pharyngeal - Mechanical Soft: Delayed swallow initiation;Reduced  anterior laryngeal mobility;Reduced laryngeal elevation;Reduced  tongue base retraction;Premature spillage to  valleculae;Pharyngeal residue - valleculae Pharyngeal - Pill: Delayed swallow initiation;Reduced anterior  laryngeal mobility;Reduced laryngeal elevation;Reduced tongue  base retraction (patient partially masticated pill provided whole  in puree)  Cervical Esophageal Phase    GO   Gabriel Rainwater MA, CCC-SLP [phone]  Cervical Esophageal Phase Cervical Esophageal Phase: [facility]         McCoy Leah Meryl 08/09/2013, 3:33 PM     Medications: I have reviewed the patient's current medications.  Assessment/Plan:  80 year old gentleman with the following issues:  1. Relapsed diffuse large cell lymphoma presented with abdominal adenopathy and lower extremity edema. He status post salvage chemotherapy utilizing ICE with rituximab. He received the first cycle of chemotherapy and did well for the most part but now presenting with multiple complications related to that.  His next cycle on hold for now.    2. Pancytopenia: This is related to systemic chemotherapy. No transfusion needed today.   3. Neutropenic sepsis: I agree with the broad spectrum antibiotic treatment he is receiving at this time. I anticipate white cell recovery soon.   4. Small pulmonary embolus on his CT scan: His risk of bleeding is very high at this point and I favor holding any anticoagulation for  the time being.   5. Pain: He will need long acting medication restarted once he is able to swallow.     LOS: 5 days   [facility] 08/10/2013, 12:25 PM

## 2013-08-10 NOTE — Evaluation (Signed)
Physical Therapy Evaluation Patient Details Name: Jack [name] MRN: [id number] DOB: [birth year] Today's Date: 08/10/2013 Time: 0093-8182 PT Time Calculation (min): 32 min  PT Assessment / Plan / Recommendation History of Present Illness  80 y.o. male with h/o B TKA and lymphoma (on chemo) admitted with SIRS, sepsis, leukopenia,  acute pulmonary embolism.   Clinical Impression  **Pt was ambulating independently with a cane and had assist for ADLs prior to admission. He presents today with a decline in functional mobility. He was able to walk 10' with RW and assist, tolerance limited by SOB and fatigue. He would benefit from acute PT to maximize safety and independence with mobility. ST-SNF recommended.  *    PT Assessment  Patient needs continued PT services    Follow Up Recommendations  SNF;Supervision/Assistance - 24 hour    Does the patient have the potential to tolerate intense rehabilitation      Barriers to Discharge        Equipment Recommendations  Wheelchair (measurements PT);Hospital bed [facility] and hospital if DC home (bedroom is upstairs))    Recommendations for Other Services     Frequency Min 3X/week    Precautions / Restrictions Precautions Precautions: Fall Precaution Comments: h/o 2 falls at home Restrictions Weight Bearing Restrictions: No   Pertinent Vitals/Pain **SaO2 97% on RA with activity, HR 114 2/4 dyspnea with walking* 0/10 pain     Mobility  Bed Mobility Overal bed mobility: Needs Assistance Bed Mobility: Supine to Sit Supine to sit: Mod assist General bed mobility comments: assist to raise trunk Transfers Overall transfer level: Needs assistance Equipment used: Rolling walker (2 wheeled) Transfers: Sit to/from [facility] Sit to Stand: Min assist Stand pivot transfers: Min assist General transfer comment: min A to rise and to manage RW, VCs for safety and hand placement Ambulation/Gait Ambulation/Gait assistance: Min  guard Ambulation Distance (Feet): 10 Feet Assistive device: Rolling walker (2 wheeled) Gait Pattern/deviations: Step-to pattern Gait velocity interpretation: Below normal speed for age/gender General Gait Details: increased time, distance limited by fatigue/SOB, min/guard for safety due to fall risk; HR 114 with walking, SaO2 97% on RA, 2/4 dyspnea with walking    Exercises     PT Diagnosis: Generalized weakness;Difficulty walking  PT Problem List: Decreased activity tolerance;Decreased mobility;Decreased balance;Cardiopulmonary status limiting activity PT Treatment Interventions: Gait training;DME instruction;Therapeutic activities;Stair training;Functional mobility training;Therapeutic exercise;Patient/family education;Balance training     PT Goals(Current goals can be found in the care plan section) Acute Rehab PT Goals Patient Stated Goal: to go home PT Goal Formulation: With patient/family Time For Goal Achievement: 08/24/13 Potential to Achieve Goals: Fair  Visit Information  Last PT Received On: 08/10/13 Assistance Needed: +1 History of Present Illness: 80 y.o. male with h/o B TKA and lymphoma (on chemo) admitted with SIRS, sepsis, leukopenia,  acute pulmonary embolism.        Prior Functioning  Home Living Family/patient expects to be discharged to:: Private residence Living Arrangements: Alone;Other relatives (grandaughter stays with pt) Available Help at Discharge: Family;Personal care attendant Type of Home: House Home Access: Ramped entrance Home Layout: Two level;Bed/bath upstairs Alternate Level Stairs-Number of Steps: 14, with rails on L and R Alternate Level Stairs-Rails: Can reach both;Left;Right Home Equipment: Walker - 2 wheels;Cane - single point;Grab bars - tub/shower;Shower seat - built in;Grab bars - toilet Additional Comments: aide came 7 days/week in morning to assist with bathing and dressing, walked with cane at home PTA; handicapped height  commode Prior Function Level of Independence: Needs assistance  Gait / Transfers Assistance Needed: independent with cane in home ADL's / Homemaking Assistance Needed: assist needed Communication Communication: No difficulties    Cognition  Cognition Arousal/Alertness: Awake/alert Behavior During Therapy: WFL for tasks assessed/performed Overall Cognitive Status: Within Functional Limits for tasks assessed    Extremity/Trunk Assessment Upper Extremity Assessment Upper Extremity Assessment: Overall WFL for tasks assessed Lower Extremity Assessment Lower Extremity Assessment: Overall WFL for tasks assessed Cervical / Trunk Assessment Cervical / Trunk Assessment: Normal   Balance Balance Overall balance assessment: Needs assistance Sitting balance-Leahy Scale: Fair Standing balance-Leahy Scale: Poor  End of Session PT - End of Session Equipment Utilized During Treatment: Gait belt Activity Tolerance: Patient limited by fatigue Patient left: in chair;with call bell/phone within reach;with family/visitor present Nurse Communication: Mobility status  GP     Blondell Reveal Kistler 08/10/2013, 1:43 PM [phone]

## 2013-08-10 NOTE — Progress Notes (Addendum)
Palliative Care Team at [place] Note   SUBJECTIVE: Minimally conversant, sitting up in chair-he does acknowledge me. Still extremely course and weak voice. He is having a very hard time eating his lunch and looks frustrated. He did get up on his feet with a walker today with PT. His daughter says he slept much better than in the ICU and thinks that really helped him. She reports that Dr. Crissie Sickles saw him last night and they were appreciative.  OBJECTIVE: Vital Signs: BP 131/83  Pulse 114  Temp(Src) 96.7 F (35.9 C) (Oral)  Resp 20  Ht 5\' 11"  (1.803 m)  Wt 80.2 kg (176 lb 12.9 oz)  BMI 24.67 kg/m2  SpO2 98%   Intake and Output: 02/16 0701 - 02/17 0700 In: 300 [IV Piggyback:300] Out: -   Physical Exam: Appears debilitated and chronically ill. More alert today.  Allergies  Allergen Reactions  . Amoxicillin     REACTION: rash  . Latex Rash  . Penicillins Rash    Medications: Scheduled Meds:  . antiseptic oral rinse  15 mL Mouth Rinse BID  . feeding supplement (RESOURCE BREEZE)  1 Container Oral TID BM  . fluconazole (DIFLUCAN) IV  100 mg Intravenous Q24H  . ipratropium  0.5 mg Nebulization TID  . levalbuterol  0.63 mg Nebulization TID  . magic mouthwash  5 mL Oral QID  . pantoprazole (PROTONIX) IV  40 mg Intravenous Q24H  . vancomycin  1,250 mg Intravenous Q24H    Continuous Infusions: . sodium chloride 75 mL/hr at 08/10/13 1500    PRN Meds: acetaminophen, acetaminophen, HYDROmorphone (DILAUDID) injection, levalbuterol, LORazepam, oxyCODONE, RESOURCE THICKENUP CLEAR   Labs: CBC    Component Value Date/Time   WBC 5.6 08/10/2013 0540   WBC 6.0 07/27/2013 0840   RBC 3.28* 08/10/2013 0540   RBC 3.12* 07/27/2013 0840   HGB 8.9* 08/10/2013 0540   HGB 8.6* 07/27/2013 0840   HCT 26.2* 08/10/2013 0540   HCT 25.9* 07/27/2013 0840   PLT 30* 08/10/2013 0540   PLT 147 07/27/2013 0840   MCV 79.9 08/10/2013 0540   MCV 83.0 07/27/2013 0840   MCH 27.1 08/10/2013 0540   MCH  27.5 07/27/2013 0840   MCHC 34.0 08/10/2013 0540   MCHC 33.2 07/27/2013 0840   RDW 16.5* 08/10/2013 0540   RDW 16.9* 07/27/2013 0840   LYMPHSABS 0.1* 08/08/2013 0645   LYMPHSABS 1.2 07/27/2013 0840   MONOABS 0.4 08/08/2013 0645   MONOABS 1.0* 07/27/2013 0840   EOSABS 0.0 08/08/2013 0645   EOSABS 0.2 07/27/2013 0840   BASOSABS 0.0 08/08/2013 0645   BASOSABS 0.0 07/27/2013 0840    CMET     Component Value Date/Time   NA 139 08/10/2013 0540   NA 132* 07/27/2013 0840   K 3.9 08/10/2013 0540   K 4.4 07/27/2013 0840   CL 105 08/10/2013 0540   CL 104 04/22/2012 1310   CO2 20 08/10/2013 0540   CO2 24 07/27/2013 0840   GLUCOSE 133* 08/10/2013 0540   GLUCOSE 131 07/27/2013 0840   GLUCOSE 108* 04/22/2012 1310   BUN 27* 08/10/2013 0540   BUN 11.8 07/27/2013 0840   CREATININE 1.24 08/10/2013 0540   CREATININE 0.6* 07/27/2013 0840   CALCIUM 8.7 08/10/2013 0540   CALCIUM 9.6 07/27/2013 0840   PROT 5.5* 08/06/2013 0526   PROT 5.7* 07/27/2013 0840   ALBUMIN 2.4* 08/06/2013 0526   ALBUMIN 2.8* 07/27/2013 0840   AST 32 08/06/2013 0526   AST 41* 07/27/2013 0840  ALT 15 08/06/2013 0526   ALT 12 07/27/2013 0840   ALKPHOS 68 08/06/2013 0526   ALKPHOS 73 07/27/2013 0840   BILITOT 0.5 08/06/2013 0526   BILITOT 0.26 07/27/2013 0840   GFRNONAA 52* 08/10/2013 0540   GFRAA 61* 08/10/2013 0540    ASSESSMENT/ PLAN: 80 yo with recurrent lymphoma recently had ICE and Rituxan chemotherapy complicated by neutropenic fever, sepsis, sore throat/mouth, dysphagia, weakness and a small PE. His goals have been for aggressive treatment and to exhaust every possible option for treating his cancer. Fortunately, today he looks stronger physically-he is still confused and tremulous.   For now his goals are for full treatment, he is a DNR. Our team will follow as needed to help with symptoms and follow plans closer to discharge ie. Hospice vs ongoing treatment if any is offered.  Lane Hacker, DO Palliative Medicine   25 minutes. Greater than 50%  of this  time was spent counseling and coordinating care related to the above assessment and plan.   Acquanetta Chain, DO  08/10/2013, 11:13 PM  Please contact Palliative Medicine Team phone at [phone] for questions and concerns.

## 2013-08-11 ENCOUNTER — Ambulatory Visit: Payer: Medicare Other

## 2013-08-11 LAB — BASIC METABOLIC PANEL
BUN: 35 mg/dL — ABNORMAL HIGH (ref 6–23)
CALCIUM: 8.7 mg/dL (ref 8.4–10.5)
CO2: 18 mEq/L — ABNORMAL LOW (ref 19–32)
CREATININE: 1.4 mg/dL — AB (ref 0.50–1.35)
Chloride: 106 mEq/L (ref 96–112)
GFR calc Af Amer: 52 mL/min — ABNORMAL LOW (ref >90)
GFR, EST NON AFRICAN AMERICAN: 45 mL/min — AB (ref >90)
GLUCOSE: 120 mg/dL — AB (ref 70–99)
Potassium: 3.9 mEq/L (ref 3.7–5.3)
SODIUM: 140 meq/L (ref 137–147)

## 2013-08-11 LAB — CULTURE, BLOOD (ROUTINE X 2)
Culture: NO GROWTH
Culture: NO GROWTH

## 2013-08-11 LAB — CBC
HCT: 27.2 % — ABNORMAL LOW (ref 39.0–52.0)
Hemoglobin: 9 g/dL — ABNORMAL LOW (ref 13.0–17.0)
MCH: 26.9 pg (ref 26.0–34.0)
MCHC: 33.1 g/dL (ref 30.0–36.0)
MCV: 81.2 fL (ref 78.0–100.0)
PLATELETS: 27 10*3/uL — AB (ref 150–400)
RBC: 3.35 MIL/uL — AB (ref 4.22–5.81)
RDW: 17 % — AB (ref 11.5–15.5)
WBC: 8.2 10*3/uL (ref 4.0–10.5)

## 2013-08-11 NOTE — Progress Notes (Signed)
IP PROGRESS NOTE  Subjective:   No new complaints. Still very weak at this point.  No bleeding issues.   Objective:  Vital signs in last 24 hours: Temp:  [97.8 F (36.6 C)-98.6 F (37 C)] 97.8 F (36.6 C) (02/18 0658) Pulse Rate:  [105-114] 105 (02/18 0658) Resp:  [20] 20 (02/18 0658) BP: (124-127)/(82) 124/82 mmHg (02/18 0658) SpO2:  [97 %-100 %] 98 % (02/18 0910) Weight change:  Last BM Date: 08/10/13  Intake/Output from previous day: 02/17 0701 - 02/18 0700 In: 6270 [P.O.:120; I.V.:1125; IV Piggyback:300] Out: -   Mouth: mucous membranes moist, pharynx normal without lesions Resp: clear to auscultation bilaterally Cardio: regular rate and rhythm, S1, S2 normal, no murmur, click, rub or gallop GI: soft, non-tender; bowel sounds normal; no masses,  no organomegaly Extremities: extremities normal, atraumatic, no cyanosis. Left leg edema much improved.   Portacath without erythema  Lab Results:  Recent Labs  08/10/13 0540 08/11/13 0530  WBC 5.6 8.2  HGB 8.9* 9.0*  HCT 26.2* 27.2*  PLT 30* 27*    BMET  Recent Labs  08/10/13 0540 08/11/13 0530  NA 139 140  K 3.9 3.9  CL 105 106  CO2 20 18*  GLUCOSE 133* 120*  BUN 27* 35*  CREATININE 1.24 1.40*  CALCIUM 8.7 8.7     Medications: I have reviewed the patient's current medications.  Assessment/Plan:  80 year old gentleman with the following issues:  1. Relapsed diffuse large cell lymphoma presented with abdominal adenopathy and lower extremity edema. He status post salvage chemotherapy utilizing ICE with rituximab. He received the first cycle of chemotherapy and did well for the most part but now presenting with multiple complications related to that.  His next cycle on hold for now. He is still rather weak and recovering very slowly at this time. I have explained to him that that isn't a worrisome sign at this point. He'll like to continue to be aggressive and would like to try chemotherapy in the future  but certainly he cannot do it in his current condition. The encouraging sign as the fact that his lower extremity swelling has decreased after 1 cycle of therapy indicating some kind of response.   2. Pancytopenia: This is related to systemic chemotherapy this has improved dramatically at this time except for his platelets. No transfusion needed today.   3. Neutropenic sepsis: Appears to be clinically better at this time.  4. Small pulmonary embolus on his CT scan: His risk of bleeding is very high at this point and I favor holding any anticoagulation for the time being.   5. Pain: He will need long acting medication restarted once he is able to swallow.     LOS: 6 days   [facility] 08/11/2013, 12:34 PM

## 2013-08-11 NOTE — Care Management Note (Signed)
Cm spoke with patient and daughter with CSW present at bedside concerning discharge planning. Pt lethargic, unable to participate in conversation. Cm discussed Md notes which documents pt wishes to dc home. Pt recommnedation for SNF/24 hr supervision. Per pt's daughter pt unable transition home at this time due to recent discovery of mold. Per pt's daughter disposition is to SNF. CSW will continue to follow. CM to sign off.   Venita Lick Bianca Raneri,MSN,RN [phone]

## 2013-08-11 NOTE — Progress Notes (Signed)
Clinical Social Work Department BRIEF PSYCHOSOCIAL ASSESSMENT 08/11/2013  Patient:  Jack [name], Jack [name]     Account Number:  [id number]     Admit date:  08/05/2013  Clinical Social Worker:  Ulyess Blossom  Date/Time:  08/11/2013 04:30 PM  Referred by:  CSW  Date Referred:  08/11/2013 Referred for  SNF Placement   Other Referral:   Interview type:  Family Other interview type:    PSYCHOSOCIAL DATA Living Status:  ALONE Admitted from facility:   Level of care:   Primary support name:  [name] Laing/daughter/ [phone] Primary support relationship to patient:  CHILD, ADULT Degree of support available:   strong    CURRENT CONCERNS Current Concerns  Post-Acute Placement   Other Concerns:    SOCIAL WORK ASSESSMENT / PLAN CSW reviewed chart and noted that PT/OT recommending SNF placement.    CSW and RNCM met with pt daughter at bedside. Pt sleeping at this time and would awake at times, but not engaged in conversation at this time. Pt daughter reports that pt is usually very coherent, but pt daughter has found that when pt has certain medications then he gets very sleepy.  RNCM and CSW discussed with pt daughter that MD documents that pt wishes to return home, but PT recommendation is for SNF and home may be an option if pt had 24 hour care. Pt daughter reports that pt is unable to return home at this time due to recent discovery of mold in pt home. Pt daughter reports that pt is not aware of the situation at home yet because pt family did not want to worry pt with information. Pt daughter reports that pt would be agreeable to rehab at SNF, but goal is for pt to return home. CSW inquired with pt daughter about plan of care regarding pt cancer. Pt daughter stated that at this time there are no plans for chemotherapy as oncologist does not feel that pt is in the condition to receive chemotherapy and further determination about chemotherapy would be determined after pt gets stronger. Pt  daughter is agreeable to initiation of SNF search in [facility] and reports that pt has been to [facility] and [facility] in the past.    CSW completed FL2 and initiated SNF search to [facility]    CSW to follow up with pt to discuss further regarding rehab when pt is more awake and able to participate in discussion.    CSW to continue to follow to assist with disposition planning.   Assessment/plan status:  Psychosocial Support/Ongoing Assessment of Needs Other assessment/ plan:   discharge planning   Information/referral to community resources:   [facility] list    PATIENT'S/FAMILY'S RESPONSE TO PLAN OF CARE: Per chart, pt alert and oriented x 4. However, at this time pt was sleeping and when awoke did not engage in conversation. Pt daughter attributes current sleepiness to medication. Pt daughter appears supportive and actively involved in care and feels that pt will be fine with plan for rehab at [facility]. Pt daughter did have questions regarding hospice care for future reference which CSW clarified, but at this time pt wants aggressive care and pt daughter respects pt wishes in regard to that.    Alison Murray, MSW, [place] Work [phone]

## 2013-08-11 NOTE — Progress Notes (Addendum)
Clinical Social Work Department CLINICAL SOCIAL WORK PLACEMENT NOTE 08/11/2013  Patient:  Jack [name], Jack [name]  Account Number:  [id number] Admit date:  08/05/2013  Clinical Social Worker:  Ulyess Blossom  Date/time:  08/11/2013 04:45 PM  Clinical Social Work is seeking post-discharge placement for this patient at the following level of care:   SKILLED NURSING   (*CSW will update this form in Epic as items are completed)   08/11/2013  Patient/family provided with [place] Department of Clinical Social Work's list of facilities offering this level of care within the geographic area requested by the patient (or if unable, by the patient's family).  08/11/2013  Patient/family informed of their freedom to choose among providers that offer the needed level of care, that participate in Medicare, Medicaid or managed care program needed by the patient, have an available bed and are willing to accept the patient.  08/11/2013  Patient/family informed of [facility]' ownership interest in [facility], as well as of the fact that they are under no obligation to receive care at this facility.  PASARR submitted to EDS on 08/11/2013 PASARR number received from EDS on 08/11/2013  FL2 transmitted to all facilities in geographic area requested by pt/family on  08/11/2013 FL2 transmitted to all facilities within larger geographic area on   Patient informed that his/her managed care company has contracts with or will negotiate with  certain facilities, including the following:     Patient/family informed of bed offers received:  08/12/2013 Patient chooses bed at [facility] Physician recommends and patient chooses bed at    Patient to be transferred to  on  [facility] on 08/16/2013 Patient to be transferred to facility by ambulance Corey Harold)  The following physician request were entered in Epic:   Additional Comments:    Alison Murray, MSW, [place] Work [phone]

## 2013-08-11 NOTE — Progress Notes (Signed)
[facility] PROGRESS NOTE  Jack [name] [id number] DOB: 16-May-1929 DOA: 08/05/2013 PCP: Adella Hare, MD  Interim Summary Patient is a pleasant 80 year old man with a past medical history of stage II follicular lymphoma that was diagnosed in 2007, currently undergoing salvage chemotherapy with ifosfamide, carboplatin, etoposide and Rituxan at the [facility]. He presented to the emergency room on 08/05/2013 with complaints of cough, fevers, sputum production as well as minimal amount of hemoptysis. Symptoms had worsened over the 2-3 days prior to admission. Initial lab work in the emergency room showed a white count of 0.1 with hemoglobin of 6.6 and platelet count of 5,000. He was admitted to the step down unit where he was started on broad-spectrum IV antibiotic therapy with vancomycin, aztreonam and Levaquin. Initial chest x-ray did not reveal acute cardiopulmonary disease. This was followed by a CT scan of lungs with IV contrast which revealed the presence of small pulmonary embolism incompletely obstructing the left lower lobe superior segment pulmonary artery. Also notable is a new nodular opacity in the left perihilar region that measured 8.6 mm with radiology recommended a followup study in 10-12 weeks. Given profound thrombocytopenia and pancytopenia secondary to chemotherapy it was felt that the risks of anticoagulation outweigh the benefits. During this hospitalization he was transfused with both platelets and packed red blood cells. Patient was seen and evaluated by his medical oncologist Dr Alen Blew. Dr. [name] a palliative care was consulted for assistance with the goals of care and pain management. Patient stabilized and on 08/09/2013 he was transferred out of the step down unit to the MedSurg floor. Given concerns for aspiration speech pathology was consulted and found to have moderate to severe oropharyngeal dysphasia. Speech pathology recommended dysphasia 3 diet with thin  liquids.  Assessment/Plan: 1. Sepsis, present on admission, evidenced by white count of 0.1, heart rate of 121, with presence of metabolic encephalopathy. Blood cultures remain negative however urine growing Enterococcus species. Organism susceptible to Vancomycin and Ampicillin. Will continue Vancomycin therapy. Blood cultures negative x 2 sets. Neutropenia resolved, afebrile, he received 3 days of triple antibiotic coverage.  2. Neutropenic fever. Patient with history of follicular lymphoma, undergoing chemotherapy at the [facility], presented with a white count of 0.1. Now Resolving.  3. Pancytopenia secondary to systemic chemotherapy. Patient transfused with 2 units of PRBC's  and 2 units of PLT's on 08/06/13. Hg stable however platelet count down to 9,000. Transfuse 3 units of platelets on 08/08/13. Labs improving this AM. Patient no longer neutropenic. Platelet count currently at 27,000.    4. Urinary Tract Infection. Urine cultures growing enterococcus species, organism susceptible to Vancomycin and Ampicillin. He is currently on Vancomycin.  5. Acute renal failure. A.m. lab work showing upward trend and creatinine to 1.27. I suspect secondary to prerenal azotemia, will start IV fluids with normal saline at 75 mL per hour 6. Pulmonary embolism. He had a CT scan of lungs with contrast that showed a small pulmonary embolus and completely obstructing the left lower lobe superior segment pulmonary artery. He is hemodynamically stable, not an anticoagulation candidate given significant anemia and thrombocytopenia in setting of chemotherapy. Risks of anticoagulation outweigh benefits.  7. Hypokalemic. AM labs showing potassium of 2.6 on 08/07/13, improved to 3.9 by 08/10/13.   8. Hyponatremia. Labs showed improving sodium on 08/09/13 at 140.  9. Stage II follicular lymphoma. Patient had been on salvage chemotherapy at the [facility] with ifosphamide, carboplatin, etoposide, and rituxan. Care consulted  for goals of care. After further  discussion over night regarding CODE STATUS with his daughters at bedside, patient made a DO NOT RESUSCITATE per his wishes.   Code Status: DNR Family Communication: I spoke with family at bedside,  present at bedside Disposition Plan: Continue supportive care, physical therapy consult.     Consultants:  Medical Oncology  Antibiotics:  Vancomycin (started on 08/05/2013)  Aztreonam (started on 08/05/2013 discontinued on 08/09/13)  Levaquin (started on 08/05/2013 discontinued on 08/09/13)  HPI/Subjective:  knows he is at [place], minimally communicative.  Discussed with family at bedside.     Objective: Filed Vitals:   08/11/13 0658  BP: 124/82  Pulse: 105  Temp: 97.8 F (36.6 C)  Resp: 20    Intake/Output Summary (Last 24 hours) at 08/11/13 1241 Last data filed at 08/11/13 0600  Gross per 24 hour  Intake   1425 ml  Output      0 ml  Net   1425 ml   Filed Weights   08/06/13 0400 08/07/13 0400 08/09/13 0500  Weight: 82.5 kg (181 lb 14.1 oz) 81.1 kg (178 lb 12.7 oz) 80.2 kg (176 lb 12.9 oz)    Exam:   General:  Patient is ill-appearing, cachetic  Cardiovascular: Tachycardic, regular rate rhythm normal S1-S2  Respiratory: Positive bilateral rhonchi, expiratory wheezing, crackles. Mildly tachypnea, room air currently satting mid 90s.  Abdomen: Soft, nontender, nondistended  Musculoskeletal: Left lower extremity edema noted, mild localized erythema  Data Reviewed: Basic Metabolic Panel:  Recent Labs Lab 08/05/13 0726 08/06/13 0526 08/07/13 0431 08/08/13 0645 08/09/13 0530 08/10/13 0540 08/11/13 0530  NA 130* 132* 134* 135* 140 139 140  K 3.2* 3.0* 2.6* 3.2* 3.5* 3.9 3.9  CL 98 97 98 100 104 105 106  CO2  --  19 22 21 23 20  18*  GLUCOSE 96 85 95 162* 121* 133* 120*  BUN 14 14 11 15 17  27* 35*  CREATININE 1.20 0.96 0.83 0.97 1.02 1.24 1.40*  CALCIUM  --  8.5 8.7 8.9 8.8 8.7 8.7  MG  --  1.9  --   --   --   --   --    PHOS  --  1.6*  --   --   --   --   --    Liver Function Tests:  Recent Labs Lab 08/05/13 0716 08/06/13 0526  AST 22 32  ALT 11 15  ALKPHOS 67 68  BILITOT 0.5 0.5  PROT 5.7* 5.5*  ALBUMIN 2.7* 2.4*   No results found for this basename: LIPASE, AMYLASE,  in the last 168 hours No results found for this basename: AMMONIA,  in the last 168 hours CBC:  Recent Labs Lab 08/06/13 0526 08/07/13 0431 08/08/13 0645 08/09/13 0530 08/10/13 0540 08/11/13 0530  WBC 0.3* 1.4* 3.0* 3.4* 5.6 8.2  NEUTROABS 0.2*  --  2.5  --   --   --   HGB 6.9* 9.3* 9.8* 9.1* 8.9* 9.0*  HCT 19.8* 26.3* 28.1* 26.5* 26.2* 27.2*  MCV 79.5 78.3 78.5 79.1 79.9 81.2  PLT 10* 14* 9* 44* 30* 27*   Cardiac Enzymes: No results found for this basename: CKTOTAL, CKMB, CKMBINDEX, TROPONINI,  in the last 168 hours BNP (last 3 results) No results found for this basename: PROBNP,  in the last 8760 hours CBG: No results found for this basename: GLUCAP,  in the last 168 hours  Recent Results (from the past 240 hour(s))  CULTURE, BLOOD (ROUTINE X 2)     Status: None   Collection  Time    08/05/13  8:52 AM      Result Value Ref Range Status   Specimen Description BLOOD RIGHT CHEST   Final   Special Requests BOTTLES DRAWN AEROBIC AND ANAEROBIC 5ML   Final   Culture  Setup Time     Final   Value: 08/05/2013 10:17     Performed at [facility]   Culture     Final   Value: NO GROWTH 5 DAYS     Performed at [facility]   Report Status 08/11/2013 FINAL   Final  URINE CULTURE     Status: None   Collection Time    08/05/13  8:54 AM      Result Value Ref Range Status   Specimen Description URINE, CLEAN CATCH   Final   Special Requests NONE   Final   Culture  Setup Time     Final   Value: 08/05/2013 12:58     Performed at [place]     Final   Value: >=100,000 COLONIES/ML     Performed at [facility]   Culture     Final   Value: ENTEROCOCCUS SPECIES      Performed at [facility]   Report Status 08/07/2013 FINAL   Final   Organism ID, Bacteria ENTEROCOCCUS SPECIES   Final  CULTURE, BLOOD (ROUTINE X 2)     Status: None   Collection Time    08/05/13  9:00 AM      Result Value Ref Range Status   Specimen Description BLOOD LEFT HAND   Final   Special Requests BOTTLES DRAWN AEROBIC AND ANAEROBIC 4CC   Final   Culture  Setup Time     Final   Value: 08/05/2013 10:17     Performed at [facility]   Culture     Final   Value: NO GROWTH 5 DAYS     Performed at [facility]   Report Status 08/11/2013 FINAL   Final  MRSA PCR SCREENING     Status: Abnormal   Collection Time    08/05/13  4:14 PM      Result Value Ref Range Status   MRSA by PCR POSITIVE (*) NEGATIVE Final   Comment:            The GeneXpert MRSA Assay (FDA     approved for NASAL specimens     only), is one component of a     comprehensive MRSA colonization     surveillance program. It is not     intended to diagnose MRSA     infection nor to guide or     monitor treatment for     MRSA infections.     RESULT CALLED TO, READ BACK BY AND VERIFIED WITH:     Marlou Sa RN [id number] 08/05/13 A NAVARRO     Studies: Dg Swallowing Func-speech Pathology  08/09/2013   Earle Gell McCoy, CCC-SLP     08/09/2013  3:33 PM Objective Swallowing Evaluation: Modified Barium Swallowing Study   Patient Details  Name: Jack [name] MRN: [id number] Date of Birth: 1928/08/13  Today's Date: 08/09/2013 Time: 7829-5621 SLP Time Calculation (min): 20 min  Past Medical History:  Past Medical History  Diagnosis Date  . GERD (gastroesophageal reflux disease)   . Osteoarthritis   . Alcohol abuse     in recovery 2006  . 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 wine only  . Irritable bowel   . Hyperlipidemia   . Insomnia   . PAC (premature atrial contraction)     stable (ruled out for a fib)  . Prostatitis     chronic bacterial  . Pseudogout   . Sinus problem      chronic  . Cellulitis     2nd to T. Pedis(not at present)  . Hearing loss 03-10-12    bilateral hearing aids  . Complication of anesthesia 03-10-12    after shoulder  surgery -difficulty awakening,breathing  problems  . Emphysema 03-10-12    tx. inhalers, uses steam room frequently  . Cramping of feet 03-10-12    cramping of both legs and hands occ.  . Osteoarthritis of left knee 03/17/2012  . Glaucoma   . Macular degeneration   . Hypertension   . Shortness of breath   . Non Hodgkin's lymphoma 03-10-12     '07-1 yr. in remission([facility])-not seeing now   Past Surgical History:  Past Surgical History  Procedure Laterality Date  . Esophagogastroduodenoscopy  01-27-02  . Inguinal hernia repair  1979  . Torn biceps repair  03-10-12    Left rotator cuff repair  . Hernia repair    . Cataract surgery  03-10-12    03-04-12(right)/ (03-11-12-left)  . Total knee arthroplasty  03/17/2012    Procedure: TOTAL KNEE ARTHROPLASTY;  Surgeon: Johnny Bridge,  MD;  Location: [facility];  Service: Orthopedics;  Laterality: Left;  Marland Kitchen Eye surgery  2014    cataract extraction with IOL both eyes staged  . Colonoscopy    . Rotator cuff repair Left   . Total knee arthroplasty Right 03/22/2013    Procedure: TOTAL KNEE ARTHROPLASTY;  Surgeon: Johnny Bridge,  MD;  Location: [facility];  Service: Orthopedics;  Laterality: Right;   righ total knee arthroplasty   HPI:  Patient is a pleasant 80 year old man with a past medical history  of stage II follicular lymphoma that was diagnosed in 2007,  currently undergoing salvage chemotherapy with ifosfamide,  carboplatin, etoposide and Rituxan at the [facility]. He  presented to the emergency room on 08/05/2013 with complaints of  cough, fevers, sputum production as well as minimal amount of  hemoptysis. Symptoms had worsened over the past 2-3 days. Initial  lab work in the emergency room showed a white count of 0.1 with  hemoglobin of 6.6 and platelet count of 5. Patient was seen and  evaluated by his medical  oncologist Dr Alen Blew. He was transfused  with platelets overnight. He was also started on broad-spectrum  IV antibiotic therapy with vancomycin, aztreonam and Levaquin.  Acute diagnosis include relapsed diffuse large cell lymphoma  presented with abdominal adenopathy and lower extremity edema,  pancytopenia related to systemic chemotherapy, neutropenic  sepsis, small pulmonary embolus.      Assessment / Plan / Recommendation Clinical Impression  Dysphagia Diagnosis: Moderate pharyngeal phase dysphagia;Severe  pharyngeal phase dysphagia Clinical impression: Patient presents with a moderate-severe  oropharyngeal dysphagia with both sensory and motor components.  Delayed swallow initiation combined with laryngeal weakness  results in decreased airway protection with thin liquids.  Moderate amounts of aspiration noted, initially with strong cough  response however subsequently silent, not clearing despite cueing  for throat clearing, coughing, or prevented despite max cues for  use of chin tuck which patient had a difficulty time  coordinating. Patient better able to protect airway with nectar  thick liquids and solid pos  trials however mild-moderate  pharyngeal residuals noted post swallow (primarily in the  vallecula) which patient is unaware of, further increasing  aspiration risk.     Treatment Recommendation  Therapy as outlined in treatment plan below    Diet Recommendation Dysphagia 3 (Mechanical Soft);Thin liquid  (family to provide thickened liquids as they wish)   Liquid Administration via: Cup;No straw Medication Administration: Whole meds with puree Supervision: Patient able to self feed;Full supervision/cueing  for compensatory strategies Compensations: Small sips/bites;Slow rate;Multiple dry swallows  after each bite/sip Postural Changes and/or Swallow Maneuvers: Seated upright 90  degrees;Upright 30-60 min after meal    Other  Recommendations Recommended Consults: MBS Oral Care Recommendations: Oral care  BID   Follow Up Recommendations   (TBD)    Frequency and Duration min 3x week  2 weeks           General HPI: Patient is a pleasant 80 year old man with a past  medical history of stage II follicular lymphoma that was  diagnosed in 2007, currently undergoing salvage chemotherapy with  ifosfamide, carboplatin, etoposide and Rituxan at the [facility]. He presented to the emergency room on 08/05/2013 with  complaints of cough, fevers, sputum production as well as minimal  amount of hemoptysis. Symptoms had worsened over the past 2-3  days. Initial lab work in the emergency room showed a white count  of 0.1 with hemoglobin of 6.6 and platelet count of 5. Patient  was seen and evaluated by his medical oncologist Dr Alen Blew. He  was transfused with platelets overnight. He was also started on  broad-spectrum IV antibiotic therapy with vancomycin, aztreonam  and Levaquin. Acute diagnosis include relapsed diffuse large cell  lymphoma presented with abdominal adenopathy and lower extremity  edema, pancytopenia related to systemic chemotherapy, neutropenic  sepsis, small pulmonary embolus.  Type of Study: Modified Barium Swallowing Study Reason for Referral: Objectively evaluate swallowing function Previous Swallow Assessment: none Diet Prior to this Study: Dysphagia 3 (soft);Thin liquids Temperature Spikes Noted: No Respiratory Status: Room air History of Recent Intubation: No Behavior/Cognition: Alert;Cooperative;Pleasant mood Oral Cavity - Dentition: Adequate natural dentition Oral Motor / Sensory Function: Within functional limits Self-Feeding Abilities: Able to feed self Patient Positioning: Upright in chair Baseline Vocal Quality: Hoarse;Breathy;Low vocal intensity Volitional Cough: Weak Volitional Swallow: Able to elicit Anatomy: Within functional limits Pharyngeal Secretions: Not observed secondary MBS    Reason for Referral Objectively evaluate swallowing function   Oral Phase Oral Preparation/Oral Phase Oral  Phase: WFL   Pharyngeal Phase Pharyngeal Phase Pharyngeal Phase: Impaired Pharyngeal - Nectar Pharyngeal - Nectar Cup: Delayed swallow initiation;Premature  spillage to pyriform sinuses;Reduced anterior laryngeal  mobility;Reduced laryngeal elevation;Reduced tongue base  retraction;Pharyngeal residue - valleculae;Pharyngeal residue -  pyriform sinuses Pharyngeal - Thin Pharyngeal - Thin Teaspoon: Delayed swallow initiation;Premature  spillage to pyriform sinuses;Reduced anterior laryngeal  mobility;Reduced laryngeal elevation;Reduced tongue base  retraction;Pharyngeal residue - valleculae;Pharyngeal residue -  pyriform sinuses;Penetration/Aspiration before  swallow;Penetration/Aspiration during  swallow;Penetration/Aspiration after swallow;Compensatory  strategies attempted (Comment);Moderate aspiration (chin tuck  ineffective) Penetration/Aspiration details (thin teaspoon): Material enters  airway, passes BELOW cords without attempt by patient to eject  out (silent aspiration) Pharyngeal - Thin Cup: Delayed swallow initiation;Premature  spillage to pyriform sinuses;Reduced anterior laryngeal  mobility;Reduced laryngeal elevation;Reduced tongue base  retraction;Pharyngeal residue - valleculae;Pharyngeal residue -  pyriform sinuses;Penetration/Aspiration before  swallow;Penetration/Aspiration during  swallow;Penetration/Aspiration after swallow;Significant  aspiration (Amount) Penetration/Aspiration details (thin cup): Material enters  airway, passes BELOW cords and not ejected out despite cough  attempt  by patient;Material enters airway, passes BELOW cords  without attempt by patient to eject out (silent aspiration) Pharyngeal - Solids Pharyngeal - Puree: Delayed swallow initiation;Reduced anterior  laryngeal mobility;Reduced laryngeal elevation;Reduced tongue  base retraction;Pharyngeal residue - valleculae;Premature  spillage to valleculae Pharyngeal - Mechanical Soft: Delayed swallow initiation;Reduced  anterior  laryngeal mobility;Reduced laryngeal elevation;Reduced  tongue base retraction;Premature spillage to  valleculae;Pharyngeal residue - valleculae Pharyngeal - Pill: Delayed swallow initiation;Reduced anterior  laryngeal mobility;Reduced laryngeal elevation;Reduced tongue  base retraction (patient partially masticated pill provided whole  in puree)  Cervical Esophageal Phase    GO   Gabriel Rainwater MA, CCC-SLP [phone]  Cervical Esophageal Phase Cervical Esophageal Phase: [facility]         McCoy Leah Meryl 08/09/2013, 3:33 PM     Scheduled Meds: . antiseptic oral rinse  15 mL Mouth Rinse BID  . feeding supplement (RESOURCE BREEZE)  1 Container Oral TID BM  . fluconazole (DIFLUCAN) IV  100 mg Intravenous Q24H  . ipratropium  0.5 mg Nebulization TID  . levalbuterol  0.63 mg Nebulization TID  . magic mouthwash  5 mL Oral QID  . pantoprazole (PROTONIX) IV  40 mg Intravenous Q24H  . vancomycin  1,250 mg Intravenous Q24H   Continuous Infusions: . sodium chloride 75 mL/hr at 08/10/13 2330    Principal Problem:   Neutropenic fever Active Problems:   LYMPHOMA   HYPERLIPIDEMIA   SIRS (systemic inflammatory response syndrome)   Sepsis   Anemia   Leukopenia   Antineoplastic chemotherapy induced pancytopenia   Thrombocytopenia, unspecified   Hemoptysis   Cellulitis   Acute pulmonary embolism   Protein-calorie malnutrition, severe    Time spent: 35 minutes    [facility] Pager [phone] If 7PM-7AM, please contact night-coverage at [email or web address], password [facility] 08/11/2013, 12:41 PM  LOS: 6 days

## 2013-08-11 NOTE — Progress Notes (Signed)
Speech Language Pathology Treatment: Dysphagia  Patient Details Name: Jack [name] MRN: [id number] DOB: 1928/10/23 Today's Date: 08/11/2013 Time: 6948-5462 SLP Time Calculation (min): 19 min  Assessment / Plan / Recommendation Clinical Impression  Pt with poor intake and ongoing symptom/clinical indications of dysphagia/aspiration.  He also denies improvement with pain related to swallowing.  Daughter present and inquiring re: diet - pt was not allowed banana or chicken salad on soft diet.    Spoke to dietician who clarified soft is fiber limited and that may be source of pt not receiving banana/chicken salad.  Appreciate dietician clarifying.  SLP to modify diet to mechanical soft/dys3/thin - also requested dietician to follow up with pt tomorrow per family request.  Called daughter to inform her of plan and she expressed gratitude.    Pt observed to cough and expectorate upon SLP entrance into room- clear secretions.  However after approximately 5 minutes, pt coughed and expectorated pink tinged liquid - consistent with protein drink he had consumed earlier.  Suspect possible pharyngeal stasis with post swallow aspiration.  Daughter reports pt has difficulty following directions, suspect he is not conducting dry swallows - with odynophagia contributing factor as well.  Reinforced need to dry swallow as much able to help decrease pharyngeal stasis pt may not sense.    Advised pt, family to SLP suspicion of ongoing aspiration based on mbs findings and clinical observation.  With intake being poor due to dysphagia, lethargy and odynophagia concern is present for pt to meet nutritional needs.  Hopeful for improvement with mucositis/candida management.   Informed pt and family that thin water is pH neutral and subsequently best absorbed by lungs when/if aspirated.  Also advised caution with solids due to weakness.        HPI HPI: Patient is a pleasant 80 year old man with a past medical history of  stage II follicular lymphoma that was diagnosed in 2007, currently undergoing salvage chemotherapy with ifosfamide, carboplatin, etoposide and Rituxan at the [facility]. He presented to the emergency room on 08/05/2013 with complaints of cough, fevers, sputum production as well as minimal amount of hemoptysis. Symptoms had worsened after admit.  Initial lab work in the emergency room showed a white count of 0.1 with hemoglobin of 6.6 and platelet count of 5. Patient was seen and evaluated by his medical oncologist Dr Alen Blew. He was transfused with platelets overnight. He was also started on broad-spectrum IV antibiotic therapy with vancomycin, aztreonam and Levaquin. Acute diagnosis include relapsed diffuse large cell lymphoma presented with abdominal adenopathy and lower extremity edema, pancytopenia related to systemic chemotherapy, neutropenic sepsis, small pulmonary embolus.  Pt has undergone swallow evaluation due to frequent choking with intake - MBS completed.  Pt dislikes thickened drinks but has been trying to consume them to decrease asp risk.  Intake has been poor and he appears to be weak today.     Pertinent Vitals Congested , weak hoarse voice, intermittent wet vocal quality per daughter, intake 5% only  SLP Plan  Continue with current plan of care    Recommendations Diet recommendations: Dysphagia 3 (mechanical soft);Thin liquid;Nectar-thick liquid (consider advancing to regular to allow family to choose items for pt - prefer soft moist foods) Liquids provided via: Cup;No straw Medication Administration: Whole meds with puree Supervision: Patient able to self feed;Full supervision/cueing for compensatory strategies Compensations: Small sips/bites;Multiple dry swallows after each bite/sip (rest break if dyspneic or coughing) Postural Changes and/or Swallow Maneuvers: Seated upright 90 degrees;Upright 30-60 min after meal  Oral Care Recommendations: Oral care BID Follow  up Recommendations: 24 hour supervision/assistance Plan: Continue with current plan of care    [place], [place], [facility] SLP [phone]

## 2013-08-12 ENCOUNTER — Ambulatory Visit: Payer: Medicare Other

## 2013-08-12 ENCOUNTER — Inpatient Hospital Stay (HOSPITAL_COMMUNITY): Payer: Medicare Other

## 2013-08-12 DIAGNOSIS — B952 Enterococcus as the cause of diseases classified elsewhere: Secondary | ICD-10-CM | POA: Diagnosis present

## 2013-08-12 DIAGNOSIS — N179 Acute kidney failure, unspecified: Secondary | ICD-10-CM | POA: Diagnosis present

## 2013-08-12 DIAGNOSIS — K117 Disturbances of salivary secretion: Secondary | ICD-10-CM

## 2013-08-12 DIAGNOSIS — C8588 Other specified types of non-Hodgkin lymphoma, lymph nodes of multiple sites: Secondary | ICD-10-CM

## 2013-08-12 DIAGNOSIS — L0291 Cutaneous abscess, unspecified: Secondary | ICD-10-CM

## 2013-08-12 DIAGNOSIS — N390 Urinary tract infection, site not specified: Secondary | ICD-10-CM

## 2013-08-12 DIAGNOSIS — L0390 Cellulitis, unspecified: Secondary | ICD-10-CM

## 2013-08-12 LAB — BASIC METABOLIC PANEL
BUN: 46 mg/dL — AB (ref 6–23)
CO2: 18 meq/L — AB (ref 19–32)
Calcium: 8.8 mg/dL (ref 8.4–10.5)
Chloride: 108 mEq/L (ref 96–112)
Creatinine, Ser: 1.95 mg/dL — ABNORMAL HIGH (ref 0.50–1.35)
GFR calc non Af Amer: 30 mL/min — ABNORMAL LOW (ref >90)
GFR, EST AFRICAN AMERICAN: 35 mL/min — AB (ref >90)
GLUCOSE: 117 mg/dL — AB (ref 70–99)
POTASSIUM: 3.9 meq/L (ref 3.7–5.3)
Sodium: 143 mEq/L (ref 137–147)

## 2013-08-12 LAB — CBC WITH DIFFERENTIAL/PLATELET
BASOS PCT: 0 % (ref 0–1)
Basophils Absolute: 0 10*3/uL (ref 0.0–0.1)
EOS PCT: 0 % (ref 0–5)
Eosinophils Absolute: 0 10*3/uL (ref 0.0–0.7)
HCT: 29.1 % — ABNORMAL LOW (ref 39.0–52.0)
HEMOGLOBIN: 9.5 g/dL — AB (ref 13.0–17.0)
LYMPHS ABS: 0.8 10*3/uL (ref 0.7–4.0)
LYMPHS PCT: 7 % — AB (ref 12–46)
MCH: 26.8 pg (ref 26.0–34.0)
MCHC: 32.6 g/dL (ref 30.0–36.0)
MCV: 82.2 fL (ref 78.0–100.0)
Monocytes Absolute: 1.3 10*3/uL — ABNORMAL HIGH (ref 0.1–1.0)
Monocytes Relative: 11 % (ref 3–12)
NEUTROS ABS: 9.4 10*3/uL — AB (ref 1.7–7.7)
NEUTROS PCT: 82 % — AB (ref 43–77)
PLATELETS: 45 10*3/uL — AB (ref 150–400)
RBC: 3.54 MIL/uL — ABNORMAL LOW (ref 4.22–5.81)
RDW: 17.3 % — AB (ref 11.5–15.5)
WBC: 11.5 10*3/uL — ABNORMAL HIGH (ref 4.0–10.5)

## 2013-08-12 LAB — VANCOMYCIN, TROUGH: VANCOMYCIN TR: 24.7 ug/mL — AB (ref 10.0–20.0)

## 2013-08-12 MED ORDER — PANTOPRAZOLE SODIUM 40 MG PO TBEC
40.0000 mg | DELAYED_RELEASE_TABLET | Freq: Every day | ORAL | Status: DC
  Administered 2013-08-13 – 2013-08-16 (×4): 40 mg via ORAL
  Filled 2013-08-12 (×4): qty 1

## 2013-08-12 MED ORDER — OXYCODONE HCL 5 MG PO TABS
5.0000 mg | ORAL_TABLET | Freq: Four times a day (QID) | ORAL | Status: DC | PRN
  Administered 2013-08-13 – 2013-08-15 (×7): 5 mg via ORAL
  Filled 2013-08-12 (×8): qty 1

## 2013-08-12 MED ORDER — VANCOMYCIN HCL IN DEXTROSE 750-5 MG/150ML-% IV SOLN
750.0000 mg | INTRAVENOUS | Status: DC
  Filled 2013-08-12: qty 150

## 2013-08-12 MED ORDER — OXYCODONE HCL ER 20 MG PO T12A
20.0000 mg | EXTENDED_RELEASE_TABLET | Freq: Once | ORAL | Status: DC
  Filled 2013-08-12: qty 1

## 2013-08-12 MED ORDER — UNJURY CHICKEN SOUP POWDER
2.0000 [oz_av] | Freq: Two times a day (BID) | ORAL | Status: DC
  Administered 2013-08-14: 2 [oz_av] via ORAL
  Administered 2013-08-14: 17:00:00 via ORAL
  Administered 2013-08-15 (×2): 2 [oz_av] via ORAL
  Filled 2013-08-12 (×9): qty 27

## 2013-08-12 MED ORDER — OXYCODONE HCL ER 20 MG PO T12A
20.0000 mg | EXTENDED_RELEASE_TABLET | Freq: Two times a day (BID) | ORAL | Status: DC
  Administered 2013-08-12 – 2013-08-16 (×8): 20 mg via ORAL
  Filled 2013-08-12 (×7): qty 1

## 2013-08-12 NOTE — Progress Notes (Addendum)
CSW continuing to follow for disposition planning.   CSW spoke with MD who discussed that pt not yet medically ready for discharge. MD discussed that pt family would like to speak with oncologist, Dr. Alen Blew today and PMT MD, Dr. Lovena Le in regard to plan of care.   CSW met with pt daughter in hallway as pt receiving nursing care. CSW provided supportive listening as pt daughter discussed that she hopes to speak with oncologist and PMT MD as pt daughter feels that pt family has been getting varying reports regarding pt and pt daughter would like further clarification from MDs regarding pt prognosis in order to make informed decision about disposition planning.   CSW provided SNF bed offers available at this time. Pt daughter discussed that pt family wanted to look into a few options for private pay at [facility] and requested CSW to contact [facility], [facility], [facility], and [facility] to make referrals regarding private pay bed options.  CSW to contact above facilities and follow up with pt daughter following discussions from MD in order to assist appropriately with pt discharge plan.  Addendum:  CSW notified pt daughter that CSW has been in contact with [facility], [facility], and [facility] and none of these facilities are able to offer a bed as private pay.  CSW awaiting responses from other facilities that [place] has left messages for.  CSW to follow up with pt daughter regarding discharge planning needs.   Alison Murray, MSW, [place] [facility] [phone]

## 2013-08-12 NOTE — Progress Notes (Signed)
[facility] PROGRESS NOTE  AERICK SIMEK [id number] DOB: 06-Apr-1929 DOA: 08/05/2013 PCP: Adella Hare, MD  Interim Summary Patient is a pleasant 80 year old man with a past medical history of stage II follicular lymphoma that was diagnosed in 2007, currently undergoing salvage chemotherapy with ifosfamide, carboplatin, etoposide and Rituxan at the [facility]. He presented to the emergency room on 08/05/2013 with complaints of cough, fevers, sputum production as well as minimal amount of hemoptysis. Symptoms had worsened over the 2-3 days prior to admission. Initial lab work in the emergency room showed a white count of 0.1 with hemoglobin of 6.6 and platelet count of 5,000. He was admitted to the step down unit where he was started on broad-spectrum IV antibiotic therapy with vancomycin, aztreonam and Levaquin. Initial chest x-ray did not reveal acute cardiopulmonary disease. This was followed by a CT scan of lungs with IV contrast which revealed the presence of small pulmonary embolism incompletely obstructing the left lower lobe superior segment pulmonary artery. Also notable is a new nodular opacity in the left perihilar region that measured 8.6 mm with radiology recommended a followup study in 10-12 weeks. Given profound thrombocytopenia and pancytopenia secondary to chemotherapy it was felt that the risks of anticoagulation outweigh the benefits. During this hospitalization he was transfused with both platelets and packed red blood cells. Patient was seen and evaluated by his medical oncologist Dr Alen Blew. Dr. [name] a palliative care was consulted for assistance with the goals of care and pain management. Patient stabilized and on 08/09/2013 he was transferred out of the step down unit to the MedSurg floor. Given concerns for aspiration speech pathology was consulted and found to have moderate to severe oropharyngeal dysphasia. Speech pathology recommended dysphasia 3 diet with thin  liquids.  Assessment/Plan: 1. Sepsis, present on admission, evidenced by white count of 0.1, heart rate of 121, with presence of metabolic encephalopathy. Blood cultures remain negative however urine growing Enterococcus species. Organism susceptible to Vancomycin and Ampicillin. Will continue Vancomycin therapy. Blood cultures negative x 2 sets. Neutropenia resolved, afebrile, he received 3 days of triple antibiotic coverage. Of total he received 8 days of vancomycin and 7 days of diflucan iv. We will discontinue fluconazole as his cultures have been negative.  2. Neutropenic fever. Patient with history of follicular lymphoma, undergoing chemotherapy at the [facility], presented with a white count of 0.1. Now Resolving.  3. Pancytopenia secondary to systemic chemotherapy. Patient transfused with 2 units of PRBC's  and 2 units of PLT's on 08/06/13. Hg stable however platelet count down to 9,000. Transfuse 3 units of platelets on 08/08/13. Labs improving this AM. Patient no longer neutropenic. Platelet count currently at 27,000.    4. Urinary Tract Infection. Urine cultures growing enterococcus species, organism susceptible to Vancomycin and Ampicillin. He is currently on Vancomycin.  5. Acute renal failure. A.m. lab work showing upward trend and creatinine to 1.27. I suspect secondary to prerenal azotemia, will start IV fluids with normal saline at 75 mL per hour . His renal function is worsening despite, fluids. And he is more acidotic. His vancomycin trough is elevated. Will repeat level tomorrow.  6. Pulmonary embolism. He had a CT scan of lungs with contrast that showed a small pulmonary embolus and completely obstructing the left lower lobe superior segment pulmonary artery. He is hemodynamically stable, not an anticoagulation candidate given significant anemia and thrombocytopenia in setting of chemotherapy. Risks of anticoagulation outweigh benefits.  7. Hypokalemic. Repleted.  8. Hyponatremia.  Labs showed improving sodium  9. Stage II follicular lymphoma. Patient had been on salvage chemotherapy at the [facility] with ifosphamide, carboplatin, etoposide, and rituxan. Care consulted for goals of care. After further discussion over night regarding CODE STATUS with his daughters at bedside, patient made a DO NOT RESUSCITATE per his wishes.   Code Status: DNR Family Communication: I spoke with family at bedside,  present at bedside Disposition Plan: Continue supportive care, physical therapy consult.     Consultants:  Medical Oncology  Antibiotics:  Vancomycin (started on 08/05/2013)  Aztreonam (started on 08/05/2013 discontinued on 08/09/13)  Levaquin (started on 08/05/2013 discontinued on 08/09/13)  HPI/Subjective:  knows he is at [place], minimally communicative. More lethargic today.  Discussed with family at bedside.     Objective: Filed Vitals:   08/11/13 2200  BP: 136/76  Pulse: 109  Resp: 20    Intake/Output Summary (Last 24 hours) at 08/12/13 1530 Last data filed at 08/12/13 0600  Gross per 24 hour  Intake   1540 ml  Output      0 ml  Net   1540 ml   Filed Weights   08/06/13 0400 08/07/13 0400 08/09/13 0500  Weight: 82.5 kg (181 lb 14.1 oz) 81.1 kg (178 lb 12.7 oz) 80.2 kg (176 lb 12.9 oz)    Exam:   General:  Patient is ill-appearing, cachetic  Cardiovascular: Tachycardic, regular rate rhythm normal S1-S2  Respiratory: Positive bilateral rhonchi, expiratory wheezing, crackles. Mildly tachypnea, room air currently satting mid 90s.  Abdomen: Soft, nontender, nondistended  Musculoskeletal: Left lower extremity edema noted, mild localized erythema  Data Reviewed: Basic Metabolic Panel:  Recent Labs Lab 08/06/13 0526  08/08/13 0645 08/09/13 0530 08/10/13 0540 08/11/13 0530 08/12/13 1105  NA 132*  < > 135* 140 139 140 143  K 3.0*  < > 3.2* 3.5* 3.9 3.9 3.9  CL 97  < > 100 104 105 106 108  CO2 19  < > 21 23 20  18* 18*  GLUCOSE 85  < >  162* 121* 133* 120* 117*  BUN 14  < > 15 17 27* 35* 46*  CREATININE 0.96  < > 0.97 1.02 1.24 1.40* 1.95*  CALCIUM 8.5  < > 8.9 8.8 8.7 8.7 8.8  MG 1.9  --   --   --   --   --   --   PHOS 1.6*  --   --   --   --   --   --   < > = values in this interval not displayed. Liver Function Tests:  Recent Labs Lab 08/06/13 0526  AST 32  ALT 15  ALKPHOS 68  BILITOT 0.5  PROT 5.5*  ALBUMIN 2.4*   No results found for this basename: LIPASE, AMYLASE,  in the last 168 hours No results found for this basename: AMMONIA,  in the last 168 hours CBC:  Recent Labs Lab 08/06/13 0526  08/08/13 0645 08/09/13 0530 08/10/13 0540 08/11/13 0530 08/12/13 1105  WBC 0.3*  < > 3.0* 3.4* 5.6 8.2 11.5*  NEUTROABS 0.2*  --  2.5  --   --   --  9.4*  HGB 6.9*  < > 9.8* 9.1* 8.9* 9.0* 9.5*  HCT 19.8*  < > 28.1* 26.5* 26.2* 27.2* 29.1*  MCV 79.5  < > 78.5 79.1 79.9 81.2 82.2  PLT 10*  < > 9* 44* 30* 27* 45*  < > = values in this interval not displayed. Cardiac Enzymes: No results found for this basename: CKTOTAL, CKMB,  CKMBINDEX, TROPONINI,  in the last 168 hours BNP (last 3 results) No results found for this basename: PROBNP,  in the last 8760 hours CBG: No results found for this basename: GLUCAP,  in the last 168 hours  Recent Results (from the past 240 hour(s))  CULTURE, BLOOD (ROUTINE X 2)     Status: None   Collection Time    08/05/13  8:52 AM      Result Value Ref Range Status   Specimen Description BLOOD RIGHT CHEST   Final   Special Requests BOTTLES DRAWN AEROBIC AND ANAEROBIC 5ML   Final   Culture  Setup Time     Final   Value: 08/05/2013 10:17     Performed at [facility]   Culture     Final   Value: NO GROWTH 5 DAYS     Performed at [facility]   Report Status 08/11/2013 FINAL   Final  URINE CULTURE     Status: None   Collection Time    08/05/13  8:54 AM      Result Value Ref Range Status   Specimen Description URINE, CLEAN CATCH   Final   Special Requests NONE    Final   Culture  Setup Time     Final   Value: 08/05/2013 12:58     Performed at [facility]     Final   Value: >=100,000 COLONIES/ML     Performed at [facility]   Culture     Final   Value: ENTEROCOCCUS SPECIES     Performed at [facility]   Report Status 08/07/2013 FINAL   Final   Organism ID, Bacteria ENTEROCOCCUS SPECIES   Final  CULTURE, BLOOD (ROUTINE X 2)     Status: None   Collection Time    08/05/13  9:00 AM      Result Value Ref Range Status   Specimen Description BLOOD LEFT HAND   Final   Special Requests BOTTLES DRAWN AEROBIC AND ANAEROBIC 4CC   Final   Culture  Setup Time     Final   Value: 08/05/2013 10:17     Performed at [facility]   Culture     Final   Value: NO GROWTH 5 DAYS     Performed at [facility]   Report Status 08/11/2013 FINAL   Final  MRSA PCR SCREENING     Status: Abnormal   Collection Time    08/05/13  4:14 PM      Result Value Ref Range Status   MRSA by PCR POSITIVE (*) NEGATIVE Final   Comment:            The GeneXpert MRSA Assay (FDA     approved for NASAL specimens     only), is one component of a     comprehensive MRSA colonization     surveillance program. It is not     intended to diagnose MRSA     infection nor to guide or     monitor treatment for     MRSA infections.     RESULT CALLED TO, READ BACK BY AND VERIFIED WITH:     Marlou Sa RN [id number] 08/05/13 A NAVARRO     Studies: Dg Chest Port 1 View  08/12/2013   CLINICAL DATA:  Suspect aspiration pneumonia, history of COPD with swallowing dysfunction  EXAM: PORTABLE CHEST - 1 VIEW  COMPARISON:  DG CHEST 1 VIEW  dated 08/06/2013  FINDINGS: The lungs are adequately inflated. There is mild stable interstitial prominence. There is no evidence of atelectasis or pneumonia. A stable calcified nodule in the left mid lung is consistent with previous granulomatous infection. The cardiopericardial silhouette is mildly enlarged. The  pulmonary vascularity is not engorged. The Port-A-Cath appliance appears unchanged with the tip of the catheter in the region of the distal SVC. There is no pleural effusion. The trachea is midline. There are degenerative changes of the right shoulder.  IMPRESSION: There is no objective evidence of atelectasis or pneumonia. There are pulmonary interstitial changes secondary to underlying COPD and previous tobacco use.   Electronically Signed   By: David  Martinique   On: 08/12/2013 12:15    Scheduled Meds: . antiseptic oral rinse  15 mL Mouth Rinse BID  . ipratropium  0.5 mg Nebulization TID  . levalbuterol  0.63 mg Nebulization TID  . magic mouthwash  5 mL Oral QID  . [START ON 08/13/2013] OxyCODONE  20 mg Oral Q12H  . OxyCODONE  20 mg Oral Once  . pantoprazole  40 mg Oral Daily  . protein supplement  2 oz Oral BID AC   Continuous Infusions: . sodium chloride 75 mL/hr at 08/11/13 1319    Principal Problem:   Neutropenic fever Active Problems:   LYMPHOMA   HYPERLIPIDEMIA   SIRS (systemic inflammatory response syndrome)   Sepsis   Anemia   Leukopenia   Antineoplastic chemotherapy induced pancytopenia   Thrombocytopenia, unspecified   Hemoptysis   Cellulitis   Acute pulmonary embolism   Protein-calorie malnutrition, severe    Time spent: 35 minutes    [facility] Pager [phone] If 7PM-7AM, please contact night-coverage at [email or web address], password [facility] 08/12/2013, 3:30 PM  LOS: 7 days

## 2013-08-12 NOTE — Progress Notes (Signed)
NUTRITION FOLLOW UP  Intervention:   Recommend Unjury chicken soup protein supplement BID Recommend 10AM and 2PM MagicCup protein supplement nourishment Will d/c Resource Breeze Discussed nutrition therapy suggestions for mucositis Will continue to monitor  Nutrition Dx:   Inadequate oral intake related to clear liquid diet as evidenced by diet order.    Goal:   Pt to meet >/= 90% of their estimated nutrition needs    Monitor:   Weights, labs, diet advancement   Assessment:   2/13:Pt with past medical history significant for follicular lymphoma, followed by Dr. Alen Blew, currently undergoing chemotherapy, presents to the emergency room with a chief complaint of fevers, cough, minimal hemoptysis and severe coughing over the past few days. He also endorses left lower extremity swelling and redness; swelling is somewhat chronic, however he feels like it's more erythematous than normal. Denies any chest pain, denies abdominal pain nausea vomiting or diarrhea. Denies insurance of breath. In the emergency room, he was intermittently confused, was found to have a low-grade temperature and underwent a CT angiogram of concern for PE given his cough and hemoptysis which did show a small pulmonary embolus per MD notes.  Pt known to RD from admission in November 2014. Met with pt and family who report pt has had a poor appetite in the past week with pt consuming nothing at all for the past 2 days. Was drinking Boost breeze at home that family would make into a smoothie. Pt's weight down 15 pounds unintentionally since December 2014. Pt reports eating some of clear liquid breakfast today. Palliative care following.  Potassium low getting oral replacement  Phosphorus low    2/19: -Discussed pt with SLP regarding poor appetite and diet recommendations -Pt had been placed on soft diet 2/13, which is more restrictive of fiber vs diet texture. SLP downgraded to Dys3 diet to assist in pt's swallowing and  allow for more options -Diet has largely consisted of liquids-soups, tomato juice, orange juice w/15% PO intake -Dislikes Resource [place], will d/c supplement as pt has not been drinking it -Family is bringing in outside foods to encourage PO intake-potato leek soup, high protein shakes -Was willing to try high protein Unjury chicken soup and MagicCup supplement as alternative way for protein intake -Discussed limiting acidic foods (orange juice, tomato juice) to assist with mouth sores. Is also using MagicMouth wash. Encouraged soft, cold, dairy based foods for improved tolerance   Height: Ht Readings from Last 1 Encounters:  08/09/13 $RemoveB'5\' 11"'[id number]$  (1.803 m)    Weight Status:   Wt Readings from Last 1 Encounters:  08/09/13 176 lb 12.9 oz (80.2 kg)  08/07/11 181 lbs  Re-estimated needs:  Kcal: 2050-2250  Protein: 100-110g  Fluid: 2-2.2L/day   Skin: +3 RLE, LLE edema   Diet Order: Dysphagia   Intake/Output Summary (Last 24 hours) at 08/12/13 1112 Last data filed at 08/12/13 0600  Gross per 24 hour  Intake   2340 ml  Output      0 ml  Net   2340 ml    Last BM: 2/18   Labs:   Recent Labs Lab 08/06/13 0526  08/09/13 0530 08/10/13 0540 08/11/13 0530  NA 132*  < > 140 139 140  K 3.0*  < > 3.5* 3.9 3.9  CL 97  < > 104 105 106  CO2 19  < > 23 20 18*  BUN 14  < > 17 27* 35*  CREATININE 0.96  < > 1.02 1.24 1.40*  CALCIUM 8.5  < >  8.8 8.7 8.7  MG 1.9  --   --   --   --   PHOS 1.6*  --   --   --   --   GLUCOSE 85  < > 121* 133* 120*  < > = values in this interval not displayed.  CBG (last 3)  No results found for this basename: GLUCAP,  in the last 72 hours  Scheduled Meds: . antiseptic oral rinse  15 mL Mouth Rinse BID  . feeding supplement (RESOURCE BREEZE)  1 Container Oral TID BM  . ipratropium  0.5 mg Nebulization TID  . levalbuterol  0.63 mg Nebulization TID  . magic mouthwash  5 mL Oral QID  . pantoprazole (PROTONIX) IV  40 mg Intravenous Q24H  . vancomycin   1,250 mg Intravenous Q24H    Continuous Infusions: . sodium chloride 75 mL/hr at 08/11/13 [place] LDN Clinical Dietitian [phone]

## 2013-08-12 NOTE — Progress Notes (Signed)
IP PROGRESS NOTE  Subjective:   No new complaints. Still very weak at this point. Still rather lethargic and sleepy today.   Objective:  Vital signs in last 24 hours: Temp:  [98.4 F (36.9 C)] 98.4 F (36.9 C) (02/18 2200) Pulse Rate:  [109] 109 (02/18 2200) Resp:  [20] 20 (02/18 2200) BP: (136)/(76) 136/76 mmHg (02/18 2200) SpO2:  [97 %] 97 % (02/18 2200) Weight change:  Last BM Date: 08/11/13  Intake/Output from previous day: 02/18 0701 - 02/19 0700 In: 2340 [P.O.:240; I.V.:1800; IV Piggyback:300] Out: -   Mouth: mucous membranes moist, pharynx normal without lesions Resp: clear to auscultation bilaterally Cardio: regular rate and rhythm, S1, S2 normal, no murmur, click, rub or gallop GI: soft, non-tender; bowel sounds normal; no masses,  no organomegaly Extremities: extremities normal, atraumatic, no cyanosis. Left leg edema much improved.   Portacath without erythema  Lab Results:  Recent Labs  08/11/13 0530 08/12/13 1105  WBC 8.2 11.5*  HGB 9.0* 9.5*  HCT 27.2* 29.1*  PLT 27* 45*    BMET  Recent Labs  08/11/13 0530 08/12/13 1105  NA 140 143  K 3.9 3.9  CL 106 108  CO2 18* 18*  GLUCOSE 120* 117*  BUN 35* 46*  CREATININE 1.40* 1.95*  CALCIUM 8.7 8.8     Medications: I have reviewed the patient's current medications.  Assessment/Plan:  80 year old gentleman with the following issues:  1. Relapsed diffuse large cell lymphoma presented with abdominal adenopathy and lower extremity edema. He status post salvage chemotherapy utilizing ICE with rituximab. He received the first cycle of chemotherapy and did well for the most part but now presenting with multiple complications related to that.  His next cycle on hold for now. He is still rather weak and recovering very slowly at this time.    2. Pancytopenia: This is related to systemic chemotherapy this has improved dramatically at this time except for his platelets. No transfusion needed today.    3. Neutropenic sepsis: Appears to be clinically better at this time.  4. Small pulmonary embolus on his CT scan: His risk of bleeding is very high at this point and I favor holding any anticoagulation for the time being. I would probably consider that if his platelet counts above 100,000.  5. Pain: He will need long acting medication restarted once he is able to swallow.   7. Disposition: He appears to be getting close to be transferred to skilled nursing facility. I like to see better platelet recovery before that happens he will probably be ready in the next 48 hours.    LOS: 7 days   PZWCHE,NIDPO 08/12/2013, 2:55 PM

## 2013-08-12 NOTE — Progress Notes (Signed)
Patient ID:Jack [name]      DOB: 23-Feb-1929      JEH:[id number]   Palliative Medicine Team at [facility] Progress Note    Subjective: Extensive conversation with patient's daughters [name] and Hassan Rowan.  Patient in obviously no condition for independent living.  Not able to clearly articulate needs and is at times confused.Marland Kitchen He intermittently is struggling with trying to understand his situation and I suspect to some degree his mortality.  Reviewed chest xray with [name] .  Family struggling with disposition because they do not feel they have a good hand on his prognosis. The patient himself has expressed that he desires continued curative treatment but his family feels that he is not going to tolerate this even after the passage of time.  His home at this time is not available to return to because of toxic soot problem that the girls are trying to have cleaned up. [name] feels that if his prognosis is only days to weeks that they may forego SNF placement to go home despite the soot because it is very important to him to be and die at home.  He is not accepting a hospice philosophy at this time, but the family feels that if it were clear that treatment options were limited to comfort that they could better help him work through the anger, pain and grief of coming to terms with his mortality. [name] informed me that at this time he is tolerating a dysphagia diet and they are allowing water trials despite the risk for aspiration.     Filed Vitals:   08/12/13 2125  BP: 120/79  Pulse: 125  Temp: 97.5 F (36.4 C)  Resp: 22   Physical exam:  General: intermittently falling asleep and when awake he is quite garrulous.  His voice is hoarse PERRL, EOMI, anicteric.  Mouth shows dryness and petechia, no coating to the tongue but the through appears sore. Chest : decreased but clear, port in place CVS: tachy ,S1, S2 Abd: soft, not tender , not distended Ext: right leg trace edema, left leg erythematous ,  with 1+ edema improving Neuro: oriented to self, very disgusted right now, not will to share.  Lab Results  Component Value Date   WBC 11.5* 08/12/2013   HGB 9.5* 08/12/2013   HCT 29.1* 08/12/2013   MCV 82.2 08/12/2013   PLT 45* 08/12/2013   Na: 143, CO2 18, BUN46 Crt1.95     Assessment and plan: 80 yr old white male with admission for fevers and cough in the face of receiving chemo for follicular lymphoma.  He also had lower extremity swelling and redness. Patient was found to be pancytopenic, with a small PE and deemed suffering from Neutropenic fever.  He was unable to be anticoagulated due to hemoptysis and severe thrombocytopenia.  He was treated for Cellulitis of the left leg as well.   1.  DNR: affirmed with daughter  2.  Cellulitis: improved but still present  3.  Worsening renal function .  May need attention from primary team if continues to climb. Continue gentle hydration  4.  Dysphagia with hoarseness: magic mouthwash in place, no overt thrush noted.  Total time 505 pm -630 pm Daughter Hassan Rowan to arrive in am.  Hopeful to talk about further goals at that time. I have set aside 3 pm slot.  Tahtiana Rozier L. Lovena Le, MD MBA The Palliative Medicine Team at [facility] Phone: [phone] Pager: [phone]

## 2013-08-12 NOTE — Progress Notes (Signed)
Pharmacy: Vancomycin   Scr elevated today 1.95 (1.4 <- 1.24) Pharmacy continues dosing vancomycin, Vancomycin dose discontinued from [facility] so trough can be obtained tonight.    Plan 1.) Vancomycin Trough at 2000 2.) re-enter vancomycin doses as needed based on trough level.   Breeann Reposa, Gaye Alken PharmD Pager #: [phone] 1:53 PM 08/12/2013

## 2013-08-12 NOTE — Progress Notes (Signed)
ANTIBIOTIC CONSULT NOTE - FOLLOW UP  Pharmacy Consult for vancomycin Indication: enterococcus UTI  Allergies  Allergen Reactions  . Amoxicillin     REACTION: rash  . Latex Rash  . Penicillins Rash    Patient Measurements: Height: 5\' 11"  (180.3 cm) Weight: 176 lb 12.9 oz (80.2 kg) IBW/kg (Calculated) : 75.3  Intake/Output from previous day: 02/18 0701 - 02/19 0700 In: 2340 [P.O.:240; I.V.:1800; IV Piggyback:300] Out: -   Labs:  Recent Labs  08/10/13 0540 08/11/13 0530 08/12/13 1105  WBC 5.6 8.2 11.5*  HGB 8.9* 9.0* 9.5*  PLT 30* 27* 45*  CREATININE 1.24 1.40* 1.95*    Estimated Creatinine Clearance: 31.1 ml/min (by C-G formula based on Cr of 1.95).  Recent Labs  08/12/13 1935  VANCOTROUGH 24.7*     Assessment: 80 y/o M on chemotherapy (R-ICE) for Non-Hodgkin's lymphoma, received first cycle 2/3 - 2/6, was brought to ED 2/12 with weakness, confusion, cough, sore throat, and found to be pancytopenic. Empiric antibiotics were ordered for suspected sepsis and pharmacy assistance with dosing was requested. Allergic to PCN (rash).   Antiinfectives D8 IV antibiotics 2/12 >>aztreonam  >> 2/16 2/12>>vancomycin  >>   2/12>>levofloxacin >> 2/16 2/14 >> Fluconazole (MD) >> 2/19  Tmax: now afebrile WBCs:  Increased today  Renal: SCr Continues to trend up 1.95 <1.4 today  Microbiology 2/12 blood x2: NGF 2/12 urine: >100K enterococcus sp (S amp, nitro, vanc, R levo, tetracyclines) 2/12 MRSA PCR (+)   Drug level / dose changes info: 2/14 VT = 23.9 on 750mg  q 12 h => change to 1250mg  q24h 2/19 VT @ 1925= 24.7 was on 1250mg  q24, LD given 2/18 @ 2208   Goal of Therapy:  Appropriate antibiotic dosing for renal function; eradication of infection Vancomycin trough 15-20  Plan:  - Decrease vancomycin to 750mg  IV T61W to start 2/20 at 1000 - follow-up clinical course, culture results, renal function - follow-up antibiotic de-escalation and length of  therapy  Thank you for the consult.  Johny Drilling, PharmD, BCPS Pager: [phone] Pharmacy: [phone] 08/12/2013 8:49 PM

## 2013-08-12 NOTE — Progress Notes (Signed)
CRITICAL VALUE ALERT  Critical value received:  Platelets 27  Date of notification:  08/11/2013  Time of notification:  0817  Critical value read back:yes  Nurse who received alert:  Aldean Baker RN  MD notified (1st page):  Karleen Hampshire  Time of first page:  0820  MD notified (2nd page):  Time of second page:  Responding MD:  Karleen Hampshire  Time MD responded:  7106 - no new orders given. MD stated she would review when she saw the patient

## 2013-08-13 ENCOUNTER — Ambulatory Visit: Payer: Medicare Other

## 2013-08-13 ENCOUNTER — Inpatient Hospital Stay (HOSPITAL_COMMUNITY): Payer: Medicare Other

## 2013-08-13 LAB — BASIC METABOLIC PANEL
BUN: 49 mg/dL — AB (ref 6–23)
CO2: 17 mEq/L — ABNORMAL LOW (ref 19–32)
Calcium: 8.6 mg/dL (ref 8.4–10.5)
Chloride: 109 mEq/L (ref 96–112)
Creatinine, Ser: 2.01 mg/dL — ABNORMAL HIGH (ref 0.50–1.35)
GFR calc non Af Amer: 29 mL/min — ABNORMAL LOW (ref >90)
GFR, EST AFRICAN AMERICAN: 34 mL/min — AB (ref >90)
Glucose, Bld: 103 mg/dL — ABNORMAL HIGH (ref 70–99)
Potassium: 3.8 mEq/L (ref 3.7–5.3)
Sodium: 143 mEq/L (ref 137–147)

## 2013-08-13 LAB — URINE MICROSCOPIC-ADD ON

## 2013-08-13 LAB — URINALYSIS, ROUTINE W REFLEX MICROSCOPIC
Bilirubin Urine: NEGATIVE
Glucose, UA: NEGATIVE mg/dL
Ketones, ur: NEGATIVE mg/dL
Leukocytes, UA: NEGATIVE
NITRITE: NEGATIVE
PH: 5.5 (ref 5.0–8.0)
Protein, ur: 30 mg/dL — AB
Specific Gravity, Urine: 1.012 (ref 1.005–1.030)
Urobilinogen, UA: 0.2 mg/dL (ref 0.0–1.0)

## 2013-08-13 MED ORDER — SODIUM CHLORIDE 0.9 % IJ SOLN
10.0000 mL | Freq: Two times a day (BID) | INTRAMUSCULAR | Status: DC
  Administered 2013-08-13 – 2013-08-16 (×4): 10 mL via INTRAVENOUS

## 2013-08-13 NOTE — Progress Notes (Signed)
Patient ID:Blanton RADFORD PEASE      DOB: [birth year]      JKA:[id number]  Patient sleeping soundly after he had been up and ambulating this am .  Met with his daughters to off emotional support and answer questions.  Appreciate Dr. Crissie Sickles note and concur with his assessment and recommendations.  Spoke with Dr. Alen Blew this am, he would not disagree with this plan if the patient so chooses.  Will continue to complement Dr. Alen Blew and Dr. Linda Hedges' work with Suezanne Jacquet through the weekend.   Total time 300 pm - 4 pm with family only. Angelize Ryce L. Lovena Le, MD MBA The Palliative Medicine Team at [facility] Phone: [phone] Pager: [phone]

## 2013-08-13 NOTE — Progress Notes (Addendum)
Physical Therapy Treatment Patient Details Name: Jack [name] MRN: [id number] DOB: 01-Nov-1928 Today's Date: 08/13/2013 Time: 1030-1101 PT Time Calculation (min): 31 min  PT Assessment / Plan / Recommendation  History of Present Illness 80 y.o. male with h/o B TKA and lymphoma (on chemo) admitted with SIRS, sepsis, leukopenia,  acute pulmonary embolism.    PT Comments   *Pt tolerated increased activity today. He walked 22' x 2 with 5 minute seated rest break between trials. ST-SNF recommended. **  Follow Up Recommendations  SNF;Supervision/Assistance - 24 hour     Does the patient have the potential to tolerate intense rehabilitation     Barriers to Discharge        Equipment Recommendations  Wheelchair (measurements PT);Hospital bed [facility] and hospital if DC home (bedroom is upstairs))    Recommendations for Other Services    Frequency Min 3X/week   Progress towards PT Goals Progress towards PT goals: Progressing toward goals  Plan Current plan remains appropriate    Precautions / Restrictions Precautions Precautions: Fall Precaution Comments: h/o 2 falls at home Restrictions Weight Bearing Restrictions: No   Pertinent Vitals/Pain *SaO2 100% on RA, HR 114 with activity**    Mobility  Bed Mobility Overal bed mobility: Needs Assistance Bed Mobility: Supine to Sit Supine to sit: Mod assist General bed mobility comments: assist to raise trunk Transfers Overall transfer level: Needs assistance Equipment used: Rolling walker (2 wheeled) Transfers: Sit to/from Stand Sit to Stand: Mod assist;From elevated surface Mod assist initially required in standing with RW due to posterior lean, with cuing, pt able to maintain balance with RW.  General transfer comment: MOd A to rise and to manage RW, VCs for safety and hand placement Ambulation/Gait Ambulation Distance (Feet): 44 Feet (22' x 2 with seated rest break for 5 minutes btwn trials) Assistive device: Rolling walker (2  wheeled) Gait Pattern/deviations: Step-to pattern;Decreased step length - right;Decreased step length - left;Trunk flexed Gait velocity interpretation: Below normal speed for age/gender General Gait Details: increased time, distance limited by fatigue/SOB, min A for safety due to fall risk; HR 114 with walking, 2/4 dyspnea with walking    Exercises     PT Diagnosis:    PT Problem List:   PT Treatment Interventions:     PT Goals (current goals can now be found in the care plan section) Acute Rehab PT Goals Patient Stated Goal: to go home -family stated ST-SNF then home as home has toxic soot that needs to be cleaned up first PT Goal Formulation: With patient/family Time For Goal Achievement: 08/24/13 Potential to Achieve Goals: Fair  Visit Information  Last PT Received On: 08/13/13 Assistance Needed: +1 History of Present Illness: 80 y.o. male with h/o B TKA and lymphoma (on chemo) admitted with SIRS, sepsis, leukopenia,  acute pulmonary embolism.     Subjective Data  Patient Stated Goal: to go home -family stated ST-SNF then home as home has toxic soot that needs to be cleaned up first   Cognition  Cognition Arousal/Alertness: Awake/alert Behavior During Therapy: WFL for tasks assessed/performed Overall Cognitive Status: Within Functional Limits for tasks assessed    Balance  Balance Sitting balance-Leahy Scale: Fair Standing balance-Leahy Scale: Poor  End of Session PT - End of Session Equipment Utilized During Treatment: Gait belt Activity Tolerance: Patient limited by fatigue Patient left: in chair;with call bell/phone within reach;with family/visitor present Nurse Communication: Mobility status   GP     Blondell Reveal Kistler 08/13/2013, 11:19 AM [phone]

## 2013-08-13 NOTE — Progress Notes (Signed)
Patient has been incontinent of urine several times this shift.  We have attempted a condom catheter x 2 and they have both fallen off.  Patient has soaked the bed x 2 with urine.  Patient just had renal ultrasound.  The renal tech stated patient still had over 1000 cc urine in bladder despite the fact he had just voided.  Dr. Karleen Hampshire notified and order received for foley catheter.  Zandra Abts [facility]  08/13/2013 4:24 PM

## 2013-08-13 NOTE — Progress Notes (Signed)
CSW continuing to follow.  CSW spoke with PMT MD, Dr. Lovena Le who was able to meet with pt daughters, but pt was sleeping soundly and PMT MD unable to speak with pt. Pt daughters are leaning toward residential hospice with plan to transition home once pt home is habitable again, but pt daughters want pt to also be agreeable to this plan.   CSW spoke with pt daughter in hallway. Pt daughter confirmed above and hopeful that discussion can be had with pt soon in order to move forward with residential hospice referral.  PMT MD plans to notify weekend CSW when if pt agreeable to residential hospice in order for weekend CSW to initiate referral.  CSW to continue to follow.  Alison Murray, MSW, [place] Work [phone]

## 2013-08-13 NOTE — Progress Notes (Signed)
[facility] PROGRESS NOTE  Jack [name] PYK:[id number] DOB: 19-Nov-1928 DOA: 08/05/2013 PCP: Adella Hare, MD  Interim Summary Patient is a pleasant 80 year old man with a past medical history of stage II follicular lymphoma that was diagnosed in 2007, currently undergoing salvage chemotherapy with ifosfamide, carboplatin, etoposide and Rituxan at the [facility]. He presented to the emergency room on 08/05/2013 with complaints of cough, fevers, sputum production as well as minimal amount of hemoptysis. Symptoms had worsened over the 2-3 days prior to admission. Initial lab work in the emergency room showed a white count of 0.1 with hemoglobin of 6.6 and platelet count of 5,000. He was admitted to the step down unit where he was started on broad-spectrum IV antibiotic therapy with vancomycin, aztreonam and Levaquin. Initial chest x-ray did not reveal acute cardiopulmonary disease. This was followed by a CT scan of lungs with IV contrast which revealed the presence of small pulmonary embolism incompletely obstructing the left lower lobe superior segment pulmonary artery. Also notable is a new nodular opacity in the left perihilar region that measured 8.6 mm with radiology recommended a followup study in 10-12 weeks. Given profound thrombocytopenia and pancytopenia secondary to chemotherapy it was felt that the risks of anticoagulation outweigh the benefits. During this hospitalization he was transfused with both platelets and packed red blood cells. Patient was seen and evaluated by his medical oncologist Dr Alen Blew. Dr. [name] a palliative care was consulted for assistance with the goals of care and pain management. Patient stabilized and on 08/09/2013 he was transferred out of the step down unit to the MedSurg floor. Given concerns for aspiration speech pathology was consulted and found to have moderate to severe oropharyngeal dysphasia. Speech pathology recommended dysphasia 3 diet with thin  liquids.  Assessment/Plan: 1. Sepsis, present on admission, evidenced by white count of 0.1, heart rate of 121, with presence of metabolic encephalopathy. Blood cultures remain negative however urine growing Enterococcus species. Organism susceptible to Vancomycin and Ampicillin.  Blood cultures negative x 2 sets. Neutropenia resolved, afebrile, he received 3 days of triple antibiotic coverage. Of total he received 8 days of vancomycin and 7 days of diflucan iv. We will discontinue fluconazole and vancomycin as his cultures have been negative and his renal function is worsening. .  2. Neutropenic fever. Patient with history of follicular lymphoma, undergoing chemotherapy at the [facility], presented with a white count of 0.1. Now Resolved 3. Pancytopenia secondary to systemic chemotherapy. Patient transfused with 2 units of PRBC's  and 2 units of PLT's on 08/06/13. Hg stable however platelet count down to 9,000. Transfuse 3 units of platelets on 08/08/13. Labs improving this AM. Patient no longer neutropenic. Platelet count currently at 45,000.    4. Urinary Tract Infection. Urine cultures growing enterococcus species, organism susceptible to Vancomycin and Ampicillin. He has completed 8 to 9 days of treatment.  5. Acute renal failure. A.m. lab work showing upward trend and creatinine to 2. suspected secondary to prerenal azotemia,  Started him on  IV fluids with normal saline at 75 mL per hour . His renal function is worsening despite, fluids. And he is more acidotic. His vancomycin trough is elevated. Discontinued the vancomycin. Obtained renal ultrasound which did not reveal any hydronephrosis.  Fluid stopped as he was developing bilateral pleural effusions.  6. Pulmonary embolism. He had a CT scan of lungs with contrast that showed a small pulmonary embolus and completely obstructing the left lower lobe superior segment pulmonary artery. He is hemodynamically stable, not  an anticoagulation candidate  given significant anemia and thrombocytopenia in setting of chemotherapy. Risks of anticoagulation outweigh benefits.  7. Hypokalemic. Repleted.  8. Hyponatremia. Labs showed improving sodium 9. Stage II follicular lymphoma. Patient had been on salvage chemotherapy at the [facility] with ifosphamide, carboplatin, etoposide, and rituxan. Care consulted for goals of care. After further discussion over night regarding CODE STATUS with his daughters at bedside, patient made a DO NOT RESUSCITATE per his wishes. Dr Lovena Le and Dr Alen Blew recommendations appreciated.   Code Status: DNR Family Communication: I spoke with family at bedside,  present at bedside Disposition Plan: Continue supportive care, physical therapy consult.     Consultants:  Medical Oncology  Palliative care consult.   Antibiotics:  Vancomycin (started on 08/05/2013)  Aztreonam (started on 08/05/2013 discontinued on 08/09/13)  Levaquin (started on 08/05/2013 discontinued on 08/09/13)  HPI/Subjective:  knows he is at [facility], minimally communicative. Walked in the hallway today and sound asleep after that.  Discussed with family at bedside.     Objective: Filed Vitals:   08/13/13 1352  BP: 142/92  Pulse: 104  Temp: 97.6 F (36.4 C)  Resp: 20    Intake/Output Summary (Last 24 hours) at 08/13/13 1832 Last data filed at 08/13/13 1726  Gross per 24 hour  Intake    600 ml  Output   1550 ml  Net   -950 ml   Filed Weights   08/06/13 0400 08/07/13 0400 08/09/13 0500  Weight: 82.5 kg (181 lb 14.1 oz) 81.1 kg (178 lb 12.7 oz) 80.2 kg (176 lb 12.9 oz)    Exam:   General:  Patient is ill-appearing, cachetic  Cardiovascular: Tachycardic, regular rate rhythm normal S1-S2  Respiratory: Positive bilateral rhonchi, expiratory wheezing, crackles. Mildly tachypnea, room air currently satting mid 90s.  Abdomen: Soft, nontender, nondistended  Musculoskeletal: Left lower extremity edema noted, mild localized  erythema  Data Reviewed: Basic Metabolic Panel:  Recent Labs Lab 08/09/13 0530 08/10/13 0540 08/11/13 0530 08/12/13 1105 08/13/13 0519  NA 140 139 140 143 143  K 3.5* 3.9 3.9 3.9 3.8  CL 104 105 106 108 109  CO2 23 20 18* 18* 17*  GLUCOSE 121* 133* 120* 117* 103*  BUN 17 27* 35* 46* 49*  CREATININE 1.02 1.24 1.40* 1.95* 2.01*  CALCIUM 8.8 8.7 8.7 8.8 8.6   Liver Function Tests: No results found for this basename: AST, ALT, ALKPHOS, BILITOT, PROT, ALBUMIN,  in the last 168 hours No results found for this basename: LIPASE, AMYLASE,  in the last 168 hours No results found for this basename: AMMONIA,  in the last 168 hours CBC:  Recent Labs Lab 08/08/13 0645 08/09/13 0530 08/10/13 0540 08/11/13 0530 08/12/13 1105  WBC 3.0* 3.4* 5.6 8.2 11.5*  NEUTROABS 2.5  --   --   --  9.4*  HGB 9.8* 9.1* 8.9* 9.0* 9.5*  HCT 28.1* 26.5* 26.2* 27.2* 29.1*  MCV 78.5 79.1 79.9 81.2 82.2  PLT 9* 44* 30* 27* 45*   Cardiac Enzymes: No results found for this basename: CKTOTAL, CKMB, CKMBINDEX, TROPONINI,  in the last 168 hours BNP (last 3 results) No results found for this basename: PROBNP,  in the last 8760 hours CBG: No results found for this basename: GLUCAP,  in the last 168 hours  Recent Results (from the past 240 hour(s))  CULTURE, BLOOD (ROUTINE X 2)     Status: None   Collection Time    08/05/13  8:52 AM      Result  Value Ref Range Status   Specimen Description BLOOD RIGHT CHEST   Final   Special Requests BOTTLES DRAWN AEROBIC AND ANAEROBIC 5ML   Final   Culture  Setup Time     Final   Value: 08/05/2013 10:17     Performed at [facility]   Culture     Final   Value: NO GROWTH 5 DAYS     Performed at [facility]   Report Status 08/11/2013 FINAL   Final  URINE CULTURE     Status: None   Collection Time    08/05/13  8:54 AM      Result Value Ref Range Status   Specimen Description URINE, CLEAN CATCH   Final   Special Requests NONE   Final   Culture   Setup Time     Final   Value: 08/05/2013 12:58     Performed at [place]     Final   Value: >=100,000 COLONIES/ML     Performed at [facility]   Culture     Final   Value: ENTEROCOCCUS SPECIES     Performed at [facility]   Report Status 08/07/2013 FINAL   Final   Organism ID, Bacteria ENTEROCOCCUS SPECIES   Final  CULTURE, BLOOD (ROUTINE X 2)     Status: None   Collection Time    08/05/13  9:00 AM      Result Value Ref Range Status   Specimen Description BLOOD LEFT HAND   Final   Special Requests BOTTLES DRAWN AEROBIC AND ANAEROBIC 4CC   Final   Culture  Setup Time     Final   Value: 08/05/2013 10:17     Performed at [facility]   Culture     Final   Value: NO GROWTH 5 DAYS     Performed at [facility]   Report Status 08/11/2013 FINAL   Final  MRSA PCR SCREENING     Status: Abnormal   Collection Time    08/05/13  4:14 PM      Result Value Ref Range Status   MRSA by PCR POSITIVE (*) NEGATIVE Final   Comment:            The GeneXpert MRSA Assay (FDA     approved for NASAL specimens     only), is one component of a     comprehensive MRSA colonization     surveillance program. It is not     intended to diagnose MRSA     infection nor to guide or     monitor treatment for     MRSA infections.     RESULT CALLED TO, READ BACK BY AND VERIFIED WITH:     Marlou Sa RN [id number] 08/05/13 A NAVARRO     Studies: US Renal  08/13/2013   CLINICAL DATA:  Acute renal failure  EXAM: RENAL/URINARY TRACT ULTRASOUND COMPLETE  COMPARISON:  06/29/2013.  FINDINGS: Right Kidney:  Length: 12.3 cm.  Mild fullness of the collecting system is noted.  Left Kidney:  Length: 12.3 cm. A 2.9 cm cyst is noted this is stable from the prior CT examination.  Bladder:  Appears normal for degree of bladder distention.  Note is made of bilateral pleural effusions.  IMPRESSION: Left renal cyst.  Bilateral pleural effusions.   Electronically Signed   By:  Inez Catalina M.D.   On: 08/13/2013 16:32   Dg Chest [facility]  08/12/2013   CLINICAL DATA:  Suspect aspiration pneumonia, history of COPD with swallowing dysfunction  EXAM: PORTABLE CHEST - 1 VIEW  COMPARISON:  DG CHEST 1 VIEW dated 08/06/2013  FINDINGS: The lungs are adequately inflated. There is mild stable interstitial prominence. There is no evidence of atelectasis or pneumonia. A stable calcified nodule in the left mid lung is consistent with previous granulomatous infection. The cardiopericardial silhouette is mildly enlarged. The pulmonary vascularity is not engorged. The Port-A-Cath appliance appears unchanged with the tip of the catheter in the region of the distal SVC. There is no pleural effusion. The trachea is midline. There are degenerative changes of the right shoulder.  IMPRESSION: There is no objective evidence of atelectasis or pneumonia. There are pulmonary interstitial changes secondary to underlying COPD and previous tobacco use.   Electronically Signed   By: David  Martinique   On: 08/12/2013 12:15    Scheduled Meds: . antiseptic oral rinse  15 mL Mouth Rinse BID  . ipratropium  0.5 mg Nebulization TID  . levalbuterol  0.63 mg Nebulization TID  . magic mouthwash  5 mL Oral QID  . OxyCODONE  20 mg Oral Q12H  . OxyCODONE  20 mg Oral Once  . pantoprazole  40 mg Oral Daily  . protein supplement  2 oz Oral BID AC   Continuous Infusions: . sodium chloride 75 mL/hr at 08/13/13 0720    Principal Problem:   Neutropenic fever Active Problems:   LYMPHOMA   HYPERLIPIDEMIA   SIRS (systemic inflammatory response syndrome)   Sepsis   Anemia   Leukopenia   Antineoplastic chemotherapy induced pancytopenia   Thrombocytopenia, unspecified   Hemoptysis   Cellulitis   Acute pulmonary embolism   Protein-calorie malnutrition, severe   Acute renal failure   Enterococcus UTI    Time spent: 35 minutes    Jack [name]  [facility] Pager [phone] If 7PM-7AM, please contact  night-coverage at [email or web address], password [facility] 08/13/2013, 6:32 PM  LOS: 8 days

## 2013-08-13 NOTE — Progress Notes (Signed)
Courtesy note  Chart reviewed. Noted Dr. Hazeline Junker last entry where he agrees that it is becoming less likely that Jack [name] will ever be able to tolerate more chemotherapy.  Spoke with [name] and gave him my best recommendation: to forego further chemotherapy given there is very high risk of suffering with little or no meaningful benefit and that I recommend he become involved with [facility].  I told him I would return Sunday to discuss this further. The best scenario: admit to hospice, transfer to [facility] while his home is made habitable and then return home to stay.  I appreciate the caring that he is receiving.  M.Rainbow Salman, MD (c) [phone]

## 2013-08-13 NOTE — Progress Notes (Addendum)
IP PROGRESS NOTE  Subjective:   No new complaints. Still very weak at this point. He was able to walk outside the room today.  No pain.   Objective:  Vital signs in last 24 hours: Temp:  [97.5 F (36.4 C)-98.7 F (37.1 C)] 98.7 F (37.1 C) (02/20 0615) Pulse Rate:  [101-125] 115 (02/20 1112) Resp:  [18-22] 18 (02/20 0615) BP: (120-128)/(79-85) 128/85 mmHg (02/20 0615) SpO2:  [93 %-100 %] 100 % (02/20 1112) Weight change:  Last BM Date: 08/11/13  Intake/Output from previous day:    Mouth: mucous membranes moist, pharynx normal without lesions Resp: clear to auscultation bilaterally Cardio: regular rate and rhythm, S1, S2 normal, no murmur, click, rub or gallop GI: soft, non-tender; bowel sounds normal; no masses,  no organomegaly Extremities: extremities normal, atraumatic, no cyanosis. Left leg edema much improved.   Portacath without erythema  Lab Results:  Recent Labs  08/11/13 0530 08/12/13 1105  WBC 8.2 11.5*  HGB 9.0* 9.5*  HCT 27.2* 29.1*  PLT 27* 45*    BMET  Recent Labs  08/12/13 1105 08/13/13 0519  NA 143 143  K 3.9 3.8  CL 108 109  CO2 18* 17*  GLUCOSE 117* 103*  BUN 46* 49*  CREATININE 1.95* 2.01*  CALCIUM 8.8 8.6     Medications: I have reviewed the patient's current medications.  Assessment/Plan:  80 year old gentleman with the following issues:  1. Relapsed diffuse large cell lymphoma presented with abdominal adenopathy and lower extremity edema. He status post salvage chemotherapy utilizing ICE with rituximab. He received the first cycle of chemotherapy and did well for the most part but now presenting with multiple complications related to that.  His next cycle on hold for now. He is still rather weak and recovering very slowly at this time. He is still not close to recovering from the previous cycle. I continue to be concerned about his ability to receive future treatments especially with his deteriorating condition. It is possible  that he could recover reasonably enough to receive chemotherapy but that is becoming less unless likely. I appreciate the help and input from Dr. Lovena Le in assisting the family with goals of care.   2. Pancytopenia: This is related to systemic chemotherapy this has improved dramatically at this time except for his platelets. No transfusion needed today.   3. Neutropenic sepsis: Appears to be clinically better at this time.  4. Small pulmonary embolus on his CT scan: His risk of bleeding is very high at this point and I favor holding any anticoagulation for the time being. I would probably consider that if his platelet counts above 100,000.  5. Pain: He will need long acting medication restarted once he is able to swallow.   6. Disposition: To be determined at this point but likely will require the skilled nursing facility upon discharge.  This was discussed Dr. Lovena Le as well as the patient and his family today. I appreciate the help and care of the hospitalist team.  I will see him back on Monday, 08/16/2013 if he is still hospitalized. Please call with questions over the weekend.   LOS: 8 days   [facility] 08/13/2013, 12:39 PM

## 2013-08-14 DIAGNOSIS — B952 Enterococcus as the cause of diseases classified elsewhere: Secondary | ICD-10-CM

## 2013-08-14 DIAGNOSIS — M533 Sacrococcygeal disorders, not elsewhere classified: Secondary | ICD-10-CM

## 2013-08-14 DIAGNOSIS — K7030 Alcoholic cirrhosis of liver without ascites: Secondary | ICD-10-CM

## 2013-08-14 LAB — BASIC METABOLIC PANEL
BUN: 41 mg/dL — ABNORMAL HIGH (ref 6–23)
CO2: 19 mEq/L (ref 19–32)
Calcium: 8.9 mg/dL (ref 8.4–10.5)
Chloride: 113 mEq/L — ABNORMAL HIGH (ref 96–112)
Creatinine, Ser: 1.66 mg/dL — ABNORMAL HIGH (ref 0.50–1.35)
GFR calc Af Amer: 43 mL/min — ABNORMAL LOW (ref >90)
GFR calc non Af Amer: 37 mL/min — ABNORMAL LOW (ref >90)
Glucose, Bld: 100 mg/dL — ABNORMAL HIGH (ref 70–99)
Potassium: 3.2 mEq/L — ABNORMAL LOW (ref 3.7–5.3)
Sodium: 150 mEq/L — ABNORMAL HIGH (ref 137–147)

## 2013-08-14 MED ORDER — DEXTROSE 5 % IV SOLN
INTRAVENOUS | Status: DC
  Administered 2013-08-14: 500 mL via INTRAVENOUS
  Administered 2013-08-15 – 2013-08-16 (×3): via INTRAVENOUS

## 2013-08-14 MED ORDER — POTASSIUM CHLORIDE 20 MEQ/15ML (10%) PO LIQD
40.0000 meq | Freq: Once | ORAL | Status: AC
  Administered 2013-08-14: 40 meq via ORAL
  Filled 2013-08-14 (×2): qty 30

## 2013-08-14 NOTE — Progress Notes (Signed)
[facility] PROGRESS NOTE  Jack [name]:[id number] DOB: [birth year] DOA: 08/05/2013 PCP: Adella Hare, MD  Interim Summary Patient is a pleasant 80 year old man with a past medical history of stage II follicular lymphoma that was diagnosed in 2007, currently undergoing salvage chemotherapy with ifosfamide, carboplatin, etoposide and Rituxan at the [facility]. He presented to the emergency room on 08/05/2013 with complaints of cough, fevers, sputum production as well as minimal amount of hemoptysis. Symptoms had worsened over the 2-3 days prior to admission. Initial lab work in the emergency room showed a white count of 0.1 with hemoglobin of 6.6 and platelet count of 5,000. He was admitted to the step down unit where he was started on broad-spectrum IV antibiotic therapy with vancomycin, aztreonam and Levaquin. Initial chest x-ray did not reveal acute cardiopulmonary disease. This was followed by a CT scan of lungs with IV contrast which revealed the presence of small pulmonary embolism incompletely obstructing the left lower lobe superior segment pulmonary artery. Also notable is a new nodular opacity in the left perihilar region that measured 8.6 mm with radiology recommended a followup study in 10-12 weeks. Given profound thrombocytopenia and pancytopenia secondary to chemotherapy it was felt that the risks of anticoagulation outweigh the benefits. During this hospitalization he was transfused with both platelets and packed red blood cells. Patient was seen and evaluated by his medical oncologist Dr Alen Blew. Dr. [name] a palliative care was consulted for assistance with the goals of care and pain management. Patient stabilized and on 08/09/2013 he was transferred out of the step down unit to the MedSurg floor. Given concerns for aspiration speech pathology was consulted and found to have moderate to severe oropharyngeal dysphasia. Speech pathology recommended dysphasia 3 diet with thin  liquids.  Assessment/Plan: 1. Sepsis, present on admission, evidenced by white count of 0.1, heart rate of 121, with presence of metabolic encephalopathy. Blood cultures remain negative however urine growing Enterococcus species. Organism susceptible to Vancomycin and Ampicillin.  Blood cultures negative x 2 sets. Neutropenia resolved, afebrile, he received 3 days of triple antibiotic coverage. Of total he received 8 days of vancomycin and 7 days of diflucan iv. We will discontinue fluconazole and vancomycin as his cultures have been negative and his renal function is worsening. .  2. Neutropenic fever. Patient with history of follicular lymphoma, undergoing chemotherapy at the [facility], presented with a white count of 0.1. Now Resolved 3. Pancytopenia secondary to systemic chemotherapy. Patient transfused with 2 units of PRBC's  and 2 units of PLT's on 08/06/13. Hg stable however platelet count down to 9,000. Transfuse 3 units of platelets on 08/08/13. Labs improving this AM. Patient no longer neutropenic. Platelet count currently at 45,000.    4. Urinary Tract Infection. Urine cultures growing enterococcus species, organism susceptible to Vancomycin and Ampicillin. He has completed 8 to 9 days of treatment.  5. Acute renal failure. A.m. lab work showing upward trend and creatinine to 2. suspected secondary to prerenal azotemia,  Started him on  IV fluids with normal saline at 75 mL per hour . His renal function is worsening despite, fluids. And he is more acidotic. His vancomycin trough is elevated. Discontinued the vancomycin. Obtained renal ultrasound which did not reveal any hydronephrosis.  Fluid stopped as he was developing bilateral pleural effusions. Repeat values show much improvement.  6. Pulmonary embolism. He had a CT scan of lungs with contrast that showed a small pulmonary embolus and completely obstructing the left lower lobe superior segment pulmonary artery.  He is hemodynamically stable,  not an anticoagulation candidate given significant anemia and thrombocytopenia in setting of chemotherapy. Risks of anticoagulation outweigh benefits.  7. Hypokalemic. Repleted.  8. Hyponatremia. Labs showed improving sodium 9. Stage II follicular lymphoma. Patient had been on salvage chemotherapy at the [facility] with ifosphamide, carboplatin, etoposide, and rituxan. Care consulted for goals of care. After further discussion over night regarding CODE STATUS with his daughters at bedside, patient made a DO NOT RESUSCITATE per his wishes. Dr Lovena Le and Dr Alen Blew recommendations appreciated.  10.  hypernatremia; possibly secondary to free water deficient.encourage po water intake. Repeat labs inam.   Code Status: DNR Family Communication: I spoke with family at bedside,  present at bedside Disposition Plan: Continue supportive care, physical therapy consult.     Consultants:  Medical Oncology  Palliative care consult.   Antibiotics:  Vancomycin (started on 08/05/2013)  Aztreonam (started on 08/05/2013 discontinued on 08/09/13)  Levaquin (started on 08/05/2013 discontinued on 08/09/13)  HPI/Subjective: He is more alert today a nd appears comfortable.     Objective: Filed Vitals:   08/14/13 1457  BP: 157/76  Pulse: 105  Temp: 98.4 F (36.9 C)  Resp: 20    Intake/Output Summary (Last 24 hours) at 08/14/13 1506 Last data filed at 08/14/13 1300  Gross per 24 hour  Intake    490 ml  Output   3550 ml  Net  -3060 ml   Filed Weights   08/06/13 0400 08/07/13 0400 08/09/13 0500  Weight: 82.5 kg (181 lb 14.1 oz) 81.1 kg (178 lb 12.7 oz) 80.2 kg (176 lb 12.9 oz)    Exam:   General:  Patient is ill-appearing, cachetic  Cardiovascular: Tachycardic, regular rate rhythm normal S1-S2  Respiratory: clear to ausculatation .. Mildly tachypnea, room air currently satting mid 90s.  Abdomen: Soft, nontender, nondistended  Musculoskeletal: Left lower extremity edema noted, mild  localized erythema  Data Reviewed: Basic Metabolic Panel:  Recent Labs Lab 08/10/13 0540 08/11/13 0530 08/12/13 1105 08/13/13 0519 08/14/13 0535  NA 139 140 143 143 150*  K 3.9 3.9 3.9 3.8 3.2*  CL 105 106 108 109 113*  CO2 20 18* 18* 17* 19  GLUCOSE 133* 120* 117* 103* 100*  BUN 27* 35* 46* 49* 41*  CREATININE 1.24 1.40* 1.95* 2.01* 1.66*  CALCIUM 8.7 8.7 8.8 8.6 8.9   Liver Function Tests: No results found for this basename: AST, ALT, ALKPHOS, BILITOT, PROT, ALBUMIN,  in the last 168 hours No results found for this basename: LIPASE, AMYLASE,  in the last 168 hours No results found for this basename: AMMONIA,  in the last 168 hours CBC:  Recent Labs Lab 08/08/13 0645 08/09/13 0530 08/10/13 0540 08/11/13 0530 08/12/13 1105  WBC 3.0* 3.4* 5.6 8.2 11.5*  NEUTROABS 2.5  --   --   --  9.4*  HGB 9.8* 9.1* 8.9* 9.0* 9.5*  HCT 28.1* 26.5* 26.2* 27.2* 29.1*  MCV 78.5 79.1 79.9 81.2 82.2  PLT 9* 44* 30* 27* 45*   Cardiac Enzymes: No results found for this basename: CKTOTAL, CKMB, CKMBINDEX, TROPONINI,  in the last 168 hours BNP (last 3 results) No results found for this basename: PROBNP,  in the last 8760 hours CBG: No results found for this basename: GLUCAP,  in the last 168 hours  Recent Results (from the past 240 hour(s))  CULTURE, BLOOD (ROUTINE X 2)     Status: None   Collection Time    08/05/13  8:52 AM  Result Value Ref Range Status   Specimen Description BLOOD RIGHT CHEST   Final   Special Requests BOTTLES DRAWN AEROBIC AND ANAEROBIC 5ML   Final   Culture  Setup Time     Final   Value: 08/05/2013 10:17     Performed at [facility]   Culture     Final   Value: NO GROWTH 5 DAYS     Performed at [facility]   Report Status 08/11/2013 FINAL   Final  URINE CULTURE     Status: None   Collection Time    08/05/13  8:54 AM      Result Value Ref Range Status   Specimen Description URINE, CLEAN CATCH   Final   Special Requests NONE   Final    Culture  Setup Time     Final   Value: 08/05/2013 12:58     Performed at [place]     Final   Value: >=100,000 COLONIES/ML     Performed at [facility]   Culture     Final   Value: ENTEROCOCCUS SPECIES     Performed at [facility]   Report Status 08/07/2013 FINAL   Final   Organism ID, Bacteria ENTEROCOCCUS SPECIES   Final  CULTURE, BLOOD (ROUTINE X 2)     Status: None   Collection Time    08/05/13  9:00 AM      Result Value Ref Range Status   Specimen Description BLOOD LEFT HAND   Final   Special Requests BOTTLES DRAWN AEROBIC AND ANAEROBIC 4CC   Final   Culture  Setup Time     Final   Value: 08/05/2013 10:17     Performed at [facility]   Culture     Final   Value: NO GROWTH 5 DAYS     Performed at [facility]   Report Status 08/11/2013 FINAL   Final  MRSA PCR SCREENING     Status: Abnormal   Collection Time    08/05/13  4:14 PM      Result Value Ref Range Status   MRSA by PCR POSITIVE (*) NEGATIVE Final   Comment:            The GeneXpert MRSA Assay (FDA     approved for NASAL specimens     only), is one component of a     comprehensive MRSA colonization     surveillance program. It is not     intended to diagnose MRSA     infection nor to guide or     monitor treatment for     MRSA infections.     RESULT CALLED TO, READ BACK BY AND VERIFIED WITH:     Marlou Sa RN [id number] 08/05/13 A NAVARRO     Studies: US Renal  08/13/2013   CLINICAL DATA:  Acute renal failure  EXAM: RENAL/URINARY TRACT ULTRASOUND COMPLETE  COMPARISON:  06/29/2013.  FINDINGS: Right Kidney:  Length: 12.3 cm.  Mild fullness of the collecting system is noted.  Left Kidney:  Length: 12.3 cm. A 2.9 cm cyst is noted this is stable from the prior CT examination.  Bladder:  Appears normal for degree of bladder distention.  Note is made of bilateral pleural effusions.  IMPRESSION: Left renal cyst.  Bilateral pleural effusions.   Electronically  Signed   By: Inez Catalina M.D.   On: 08/13/2013 16:32    Scheduled Meds: .  antiseptic oral rinse  15 mL Mouth Rinse BID  . ipratropium  0.5 mg Nebulization TID  . levalbuterol  0.63 mg Nebulization TID  . magic mouthwash  5 mL Oral QID  . OxyCODONE  20 mg Oral Q12H  . OxyCODONE  20 mg Oral Once  . pantoprazole  40 mg Oral Daily  . potassium chloride  40 mEq Oral Once  . protein supplement  2 oz Oral BID AC  . sodium chloride  10 mL Intravenous Q12H   Continuous Infusions: . dextrose 500 mL (08/14/13 1104)    Principal Problem:   Neutropenic fever Active Problems:   LYMPHOMA   HYPERLIPIDEMIA   SIRS (systemic inflammatory response syndrome)   Sepsis   Anemia   Leukopenia   Antineoplastic chemotherapy induced pancytopenia   Thrombocytopenia, unspecified   Hemoptysis   Cellulitis   Acute pulmonary embolism   Protein-calorie malnutrition, severe   Acute renal failure   Enterococcus UTI    Time spent: 35 minutes    Ileigh Mettler  [facility] Pager [phone] If 7PM-7AM, please contact night-coverage at [email or web address], password [facility] 08/14/2013, 3:06 PM  LOS: 9 days

## 2013-08-14 NOTE — Progress Notes (Signed)
Patient ID:Jack [name]      DOB: Aug 23, 1928      ERD:[id number]   Palliative Medicine Team at [facility] Progress Note    Subjective: Patient more awake today.  Voice improving.  Had visit from his dog.  Patient receiving Wine night prn , generally drinks 1-2 glasses per night.  Last known dosing was night before last. Updated daughter Jack [name]. Noted urinary retention with foley placement.  Filed Vitals:   08/14/13 1457  BP: 157/76  [name]: 105  Temp: 98.4 F (36.9 C)  Resp: 20   Physical exam:  General: confused but more awake and alert, voice stronger Chest decreased but clear anteriorly CVS, tachy S1, S2 Abd: somewhat softer since placing catheter, still slightly distended Ext: less red left leg Neuro: intermittently confused  Lab Results  Component Value Date   CREATININE 1.66* 08/14/2013   BUN 41* 08/14/2013   NA 150* 08/14/2013   K 3.2* 08/14/2013   CL 113* 08/14/2013   CO2 19 08/14/2013   Lab Results  Component Value Date   WBC 11.5* 08/12/2013   HGB 9.5* 08/12/2013   HCT 29.1* 08/12/2013   MCV 82.2 08/12/2013   PLT 45* 08/12/2013     Assessment and plan: 80 yr old white male with follicular lymphoma s/p chemotherapy admitted with neutropenic fever, and altered mental status with dehydration.  Course complicated by cellulitis of left leg, altered mentation, urinary retention, mucocitis. 1.  DNR  2.  Patient too confused to really participate in goals.  I have been supporting his daughters.  3.  Added water to [place] oxygen to improve oral hydration  4.  Sacral discomfort. Add air mattress overlay.   Updated Jack [name] on improved renal function   Total Time 15 min 445-500 pm   Jack Marti L. Lovena Le, MD MBA The Palliative Medicine Team at [facility] Phone: [phone] Pager: [phone]

## 2013-08-14 NOTE — Progress Notes (Signed)
Overlay bed exchanged for reg. Bed. Daughter at bedside most of the day.

## 2013-08-15 LAB — BASIC METABOLIC PANEL
BUN: 22 mg/dL (ref 6–23)
CHLORIDE: 108 meq/L (ref 96–112)
CO2: 24 mEq/L (ref 19–32)
Calcium: 8.7 mg/dL (ref 8.4–10.5)
Creatinine, Ser: 1.24 mg/dL (ref 0.50–1.35)
GFR calc non Af Amer: 52 mL/min — ABNORMAL LOW (ref >90)
GFR, EST AFRICAN AMERICAN: 61 mL/min — AB (ref >90)
Glucose, Bld: 147 mg/dL — ABNORMAL HIGH (ref 70–99)
POTASSIUM: 3.1 meq/L — AB (ref 3.7–5.3)
Sodium: 145 mEq/L (ref 137–147)

## 2013-08-15 LAB — CBC WITH DIFFERENTIAL/PLATELET
Basophils Absolute: 0.1 10*3/uL (ref 0.0–0.1)
Basophils Relative: 1 % (ref 0–1)
Eosinophils Absolute: 0 10*3/uL (ref 0.0–0.7)
Eosinophils Relative: 0 % (ref 0–5)
HEMATOCRIT: 27.9 % — AB (ref 39.0–52.0)
Hemoglobin: 9.2 g/dL — ABNORMAL LOW (ref 13.0–17.0)
Lymphocytes Relative: 13 % (ref 12–46)
Lymphs Abs: 1.3 10*3/uL (ref 0.7–4.0)
MCH: 27.1 pg (ref 26.0–34.0)
MCHC: 33 g/dL (ref 30.0–36.0)
MCV: 82.1 fL (ref 78.0–100.0)
MONOS PCT: 14 % — AB (ref 3–12)
Monocytes Absolute: 1.4 10*3/uL — ABNORMAL HIGH (ref 0.1–1.0)
NEUTROS ABS: 7 10*3/uL (ref 1.7–7.7)
Neutrophils Relative %: 72 % (ref 43–77)
Platelets: 94 10*3/uL — ABNORMAL LOW (ref 150–400)
RBC: 3.4 MIL/uL — AB (ref 4.22–5.81)
RDW: 17.8 % — ABNORMAL HIGH (ref 11.5–15.5)
WBC: 9.8 10*3/uL (ref 4.0–10.5)

## 2013-08-15 MED ORDER — POTASSIUM CHLORIDE 20 MEQ/15ML (10%) PO LIQD
40.0000 meq | Freq: Two times a day (BID) | ORAL | Status: DC
  Filled 2013-08-15 (×4): qty 30

## 2013-08-15 MED ORDER — OXYCODONE HCL 5 MG PO TABS
5.0000 mg | ORAL_TABLET | ORAL | Status: DC | PRN
  Administered 2013-08-15 – 2013-08-16 (×3): 5 mg via ORAL
  Filled 2013-08-15 (×3): qty 1

## 2013-08-15 NOTE — Progress Notes (Addendum)
Patient ID:Jack [name]      DOB: [birth year]      SFK:[id number]  Noted events of the morning.  Patient has decided not to do chemo and wants to transition to Hospice facility temporarily until he can be in his own home.  Emotional support to his daughters only today.  Nursing states patient could use access to his pain meds at q 4 prn which is not unreasonable. Patient refusing Dilaudid will DC   Will continue to shadow with family as he transitions.  Reviewed counts with daughters, they are recovering but he would likely not tolerate another round of chemo.   Gianna Calef L. Lovena Le, MD MBA The Palliative Medicine Team at [facility] Phone: [phone] Pager: [phone]

## 2013-08-15 NOTE — Progress Notes (Signed)
Pt not able to tolerate the taste of po Potassium elixir nor the size of the tablets. Pt has a central line; could the Potassium supplements be given iv? Josph Macho RN [phone]

## 2013-08-15 NOTE — Progress Notes (Signed)
[facility] PROGRESS NOTE  Jack [name] HWE:[id number] DOB: [birth year] DOA: 08/05/2013 PCP: Adella Hare, MD  Interim Summary Patient is a pleasant 80 year old man with a past medical history of stage II follicular lymphoma that was diagnosed in 2007, currently undergoing salvage chemotherapy with ifosfamide, carboplatin, etoposide and Rituxan at the [facility]. He presented to the emergency room on 08/05/2013 with complaints of cough, fevers, sputum production as well as minimal amount of hemoptysis. Symptoms had worsened over the 2-3 days prior to admission. Initial lab work in the emergency room showed a white count of 0.1 with hemoglobin of 6.6 and platelet count of 5,000. He was admitted to the step down unit where he was started on broad-spectrum IV antibiotic therapy with vancomycin, aztreonam and Levaquin. Initial chest x-ray did not reveal acute cardiopulmonary disease. This was followed by a CT scan of lungs with IV contrast which revealed the presence of small pulmonary embolism incompletely obstructing the left lower lobe superior segment pulmonary artery. Also notable is a new nodular opacity in the left perihilar region that measured 8.6 mm with radiology recommended a followup study in 10-12 weeks. Given profound thrombocytopenia and pancytopenia secondary to chemotherapy it was felt that the risks of anticoagulation outweigh the benefits. During this hospitalization he was transfused with both platelets and packed red blood cells. Patient was seen and evaluated by his medical oncologist Dr Alen Blew. Dr. [name] a palliative care was consulted for assistance with the goals of care and pain management. Patient stabilized and on 08/09/2013 he was transferred out of the step down unit to the MedSurg floor. Given concerns for aspiration speech pathology was consulted and found to have moderate to severe oropharyngeal dysphasia. Speech pathology recommended dysphasia 3 diet with thin  liquids.  Assessment/Plan: 1. Sepsis, present on admission, evidenced by white count of 0.1, heart rate of 121, with presence of metabolic encephalopathy. Blood cultures remain negative however urine growing Enterococcus species. Organism susceptible to Vancomycin and Ampicillin.  Blood cultures negative x 2 sets. Neutropenia resolved, afebrile, he received 3 days of triple antibiotic coverage. Of total he received 8 days of vancomycin and 7 days of diflucan iv. We will discontinue fluconazole and vancomycin as his cultures have been negative and his renal function is worsening. .  2. Neutropenic fever. Patient with history of follicular lymphoma, undergoing chemotherapy at the [facility], presented with a white count of 0.1. Now Resolved 3. Pancytopenia secondary to systemic chemotherapy. Patient transfused with 2 units of PRBC's  and 2 units of PLT's on 08/06/13. Hg stable however platelet count down to 9,000. Transfuse 3 units of platelets on 08/08/13. Labs improving this AM. Patient no longer neutropenic. Platelet count currently at 45,000.    4. Urinary Tract Infection. Urine cultures growing enterococcus species, organism susceptible to Vancomycin and Ampicillin. He has completed 8 days of treatment.  5. Acute renal failure. A.m. lab work showing upward trend and creatinine to 2. suspected secondary to prerenal azotemia,  Started him on  IV fluids with normal saline at 75 mL per hour . His renal function is worsening despite, fluids. And he is more acidotic. His vancomycin trough is elevated. Discontinued the vancomycin. Obtained renal ultrasound which did not reveal any hydronephrosis.  Fluid stopped as he was developing bilateral pleural effusions. Repeat values show much improvement.  6. Pulmonary embolism. He had a CT scan of lungs with contrast that showed a small pulmonary embolus and completely obstructing the left lower lobe superior segment pulmonary artery. He is  hemodynamically stable, not  an anticoagulation candidate given significant anemia and thrombocytopenia in setting of chemotherapy. Risks of anticoagulation outweigh benefits.  7. Hypokalemic. Will be repleted.  Repleted.  8. Hyponatremia. Labs showed improving sodium 9. Stage II follicular lymphoma. Patient had been on salvage chemotherapy at the [facility] with ifosphamide, carboplatin, etoposide, and rituxan. Care consulted for goals of care. After further discussion over night regarding CODE STATUS with his daughters at bedside, patient made a DO NOT RESUSCITATE per his wishes. Dr Lovena Le and Dr Alen Blew recommendations appreciated.  Pt reported that he does not want chemo anymore and that he know s he is actively dying. Social worker consulted for beacon placement.  10.  hypernatremia; possibly secondary to free water deficient.encourage po water intake. Repeat labs inam much improvement. Continue for another 24 hours.   Code Status: DNR Family Communication: I spoke with family at bedside,  present at bedside Disposition Plan: Continue supportive care, physical therapy consult.     Consultants:  Medical Oncology  Palliative care consult.   Antibiotics:  Vancomycin (started on 08/05/2013)-2/20  Aztreonam (started on 08/05/2013 discontinued on 08/09/13)  Levaquin (started on 08/05/2013 discontinued on 08/09/13)  HPI/Subjective: He is more alert today a nd appears exhausted.  Reported that he doesn ot want chemo anymore and reports he is dying.  He wanted to be back in bed.     Objective: Filed Vitals:   08/15/13 0557  BP: 141/90  Pulse: 112  Temp: 98.6 F (37 C)  Resp: 20    Intake/Output Summary (Last 24 hours) at 08/15/13 1329 Last data filed at 08/15/13 0600  Gross per 24 hour  Intake 1286.67 ml  Output   1870 ml  Net -583.33 ml   Filed Weights   08/06/13 0400 08/07/13 0400 08/09/13 0500  Weight: 82.5 kg (181 lb 14.1 oz) 81.1 kg (178 lb 12.7 oz) 80.2 kg (176 lb 12.9 oz)     Exam:   General:  Patient is ill-appearing, cachetic  Cardiovascular: Tachycardic, regular rate rhythm normal S1-S2  Respiratory: clear to ausculatation .. Mildly tachypnea, room air currently satting mid 90s.  Abdomen: Soft, nontender, nondistended  Musculoskeletal: Left lower extremity edema noted, mild localized erythema  Data Reviewed: Basic Metabolic Panel:  Recent Labs Lab 08/11/13 0530 08/12/13 1105 08/13/13 0519 08/14/13 0535 08/15/13 1004  NA 140 143 143 150* 145  K 3.9 3.9 3.8 3.2* 3.1*  CL 106 108 109 113* 108  CO2 18* 18* 17* 19 24  GLUCOSE 120* 117* 103* 100* 147*  BUN 35* 46* 49* 41* 22  CREATININE 1.40* 1.95* 2.01* 1.66* 1.24  CALCIUM 8.7 8.8 8.6 8.9 8.7   Liver Function Tests: No results found for this basename: AST, ALT, ALKPHOS, BILITOT, PROT, ALBUMIN,  in the last 168 hours No results found for this basename: LIPASE, AMYLASE,  in the last 168 hours No results found for this basename: AMMONIA,  in the last 168 hours CBC:  Recent Labs Lab 08/09/13 0530 08/10/13 0540 08/11/13 0530 08/12/13 1105 08/15/13 1004  WBC 3.4* 5.6 8.2 11.5* 9.8  NEUTROABS  --   --   --  9.4* 7.0  HGB 9.1* 8.9* 9.0* 9.5* 9.2*  HCT 26.5* 26.2* 27.2* 29.1* 27.9*  MCV 79.1 79.9 81.2 82.2 82.1  PLT 44* 30* 27* 45* 94*   Cardiac Enzymes: No results found for this basename: CKTOTAL, CKMB, CKMBINDEX, TROPONINI,  in the last 168 hours BNP (last 3 results) No results found for this basename: PROBNP,  in  the last 8760 hours CBG: No results found for this basename: GLUCAP,  in the last 168 hours  Recent Results (from the past 240 hour(s))  MRSA PCR SCREENING     Status: Abnormal   Collection Time    08/05/13  4:14 PM      Result Value Ref Range Status   MRSA by PCR POSITIVE (*) NEGATIVE Final   Comment:            The GeneXpert MRSA Assay (FDA     approved for NASAL specimens     only), is one component of a     comprehensive MRSA colonization     surveillance  program. It is not     intended to diagnose MRSA     infection nor to guide or     monitor treatment for     MRSA infections.     RESULT CALLED TO, READ BACK BY AND VERIFIED WITH:     Marlou Sa RN [id number] 08/05/13 A NAVARRO     Studies: US Renal  08/13/2013   CLINICAL DATA:  Acute renal failure  EXAM: RENAL/URINARY TRACT ULTRASOUND COMPLETE  COMPARISON:  06/29/2013.  FINDINGS: Right Kidney:  Length: 12.3 cm.  Mild fullness of the collecting system is noted.  Left Kidney:  Length: 12.3 cm. A 2.9 cm cyst is noted this is stable from the prior CT examination.  Bladder:  Appears normal for degree of bladder distention.  Note is made of bilateral pleural effusions.  IMPRESSION: Left renal cyst.  Bilateral pleural effusions.   Electronically Signed   By: Inez Catalina M.D.   On: 08/13/2013 16:32    Scheduled Meds: . antiseptic oral rinse  15 mL Mouth Rinse BID  . ipratropium  0.5 mg Nebulization TID  . levalbuterol  0.63 mg Nebulization TID  . magic mouthwash  5 mL Oral QID  . OxyCODONE  20 mg Oral Q12H  . OxyCODONE  20 mg Oral Once  . pantoprazole  40 mg Oral Daily  . potassium chloride  40 mEq Oral BID  . protein supplement  2 oz Oral BID AC  . sodium chloride  10 mL Intravenous Q12H   Continuous Infusions: . dextrose 50 mL/hr at 08/15/13 [id number]    Principal Problem:   Neutropenic fever Active Problems:   LYMPHOMA   HYPERLIPIDEMIA   SIRS (systemic inflammatory response syndrome)   Sepsis   Anemia   Leukopenia   Antineoplastic chemotherapy induced pancytopenia   Thrombocytopenia, unspecified   Hemoptysis   Cellulitis   Acute pulmonary embolism   Protein-calorie malnutrition, severe   Acute renal failure   Enterococcus UTI    Time spent: 35 minutes    Jack [name]  [facility] Pager [phone] If 7PM-7AM, please contact night-coverage at [email or web address], password [facility] 08/15/2013, 1:29 PM  LOS: 10 days
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
## 2013-08-16 ENCOUNTER — Telehealth: Payer: Self-pay | Admitting: *Deleted

## 2013-08-16 ENCOUNTER — Non-Acute Institutional Stay: Payer: Medicare Other | Admitting: Internal Medicine

## 2013-08-16 DIAGNOSIS — J438 Other emphysema: Secondary | ICD-10-CM

## 2013-08-16 DIAGNOSIS — D61818 Other pancytopenia: Secondary | ICD-10-CM

## 2013-08-16 DIAGNOSIS — D649 Anemia, unspecified: Secondary | ICD-10-CM

## 2013-08-16 DIAGNOSIS — C8589 Other specified types of non-Hodgkin lymphoma, extranodal and solid organ sites: Secondary | ICD-10-CM

## 2013-08-16 DIAGNOSIS — C8590 Non-Hodgkin lymphoma, unspecified, unspecified site: Secondary | ICD-10-CM

## 2013-08-16 DIAGNOSIS — R6510 Systemic inflammatory response syndrome (SIRS) of non-infectious origin without acute organ dysfunction: Secondary | ICD-10-CM

## 2013-08-16 DIAGNOSIS — I2699 Other pulmonary embolism without acute cor pulmonale: Secondary | ICD-10-CM

## 2013-08-16 DIAGNOSIS — A419 Sepsis, unspecified organism: Secondary | ICD-10-CM

## 2013-08-16 MED ORDER — UNJURY CHICKEN SOUP POWDER: 2.0000 [oz_av] | Freq: Two times a day (BID) | ORAL | Status: DC

## 2013-08-16 MED ORDER — LEVALBUTEROL HCL 0.63 MG/3ML IN NEBU: 0.6300 mg | INHALATION_SOLUTION | Freq: Four times a day (QID) | RESPIRATORY_TRACT | Status: AC | PRN

## 2013-08-16 MED ORDER — IPRATROPIUM BROMIDE 0.02 % IN SOLN: 0.5000 mg | RESPIRATORY_TRACT | Status: AC | PRN

## 2013-08-16 NOTE — Progress Notes (Signed)
CSW received notification from [facility], Erling Conte that pt has bed available at [facility] for today.  CSW notified MD.  CSW facilitated pt discharge needs including discussing with [facility] liaison, Erling Conte, faxing pt discharge information to [facility], discussing with pt and pt family at bedside, providing RN phone number to call report, and arranging ambulance transport for pt to [facility]. (Service Request ID#: [id number])  No further social work needs identified at this time.  CSW signing off.   Alison Murray, MSW, [place] [facility] [phone]

## 2013-08-16 NOTE — Discharge Summary (Signed)
Physician Discharge Summary  Jack [name] [id number] DOB: 12-10-1928 DOA: 08/05/2013  PCP: Adella Hare, MD  Admit date: 08/05/2013 Discharge date: 08/16/2013  Time spent: 25 minutes  Recommendations for Outpatient Follow-up:  1. Follow up with Dr Alen Blew as needed  Discharge Diagnoses:  Principal Problem:   Neutropenic fever Active Problems:   LYMPHOMA   HYPERLIPIDEMIA   SIRS (systemic inflammatory response syndrome)   Sepsis   Anemia   Leukopenia   Antineoplastic chemotherapy induced pancytopenia   Thrombocytopenia, unspecified   Hemoptysis   Cellulitis   Acute pulmonary embolism   Protein-calorie malnutrition, severe   Acute renal failure   Enterococcus UTI     Diet recommendation: dysphagia 3 diet  Filed Weights   08/07/13 0400 08/09/13 0500 08/16/13 0500  Weight: 81.1 kg (178 lb 12.7 oz) 80.2 kg (176 lb 12.9 oz) 83.8 kg (184 lb 11.9 oz)    History of present illness:   Patient is a pleasant 80 year old man with a past medical history of stage II follicular lymphoma that was diagnosed in 2007, currently undergoing salvage chemotherapy with ifosfamide, carboplatin, etoposide and Rituxan at the [facility]. He presented to the emergency room on 08/05/2013 with complaints of cough, fevers, sputum production as well as minimal amount of hemoptysis. Symptoms had worsened over the 2-3 days prior to admission. Initial lab work in the emergency room showed a white count of 0.1 with hemoglobin of 6.6 and platelet count of 5,000. He was admitted to the step down unit where he was started on broad-spectrum IV antibiotic therapy with vancomycin, aztreonam and Levaquin. Initial chest x-ray did not reveal acute cardiopulmonary disease. This was followed by a CT scan of lungs with IV contrast which revealed the presence of small pulmonary embolism incompletely obstructing the left lower lobe superior segment pulmonary artery. Also notable is a new nodular opacity in the left  perihilar region that measured 8.6 mm with radiology recommended a followup study in 10-12 weeks. Given profound thrombocytopenia and pancytopenia secondary to chemotherapy it was felt that the risks of anticoagulation outweigh the benefits. During this hospitalization he was transfused with both platelets and packed red blood cells. Patient was seen and evaluated by his medical oncologist Dr Alen Blew. Dr. [name] a palliative care was consulted for assistance with the goals of care and pain management. Patient stabilized and on 08/09/2013 he was transferred out of the step down unit to the MedSurg floor. Given concerns for aspiration speech pathology was consulted and found to have moderate to severe oropharyngeal dysphasia. Speech pathology recommended dysphasia 3 diet with thin liquids. Patient did not want any chemotherapy and he is aware that he is actively dying. Family wanted him to be discharged [facility] till they can make his house suitable, for him to come.   Hospital Course:  Sepsis, present on admission, evidenced by white count of 0.1, heart rate of 121, with presence of metabolic encephalopathy. Blood cultures remain negative however urine growing Enterococcus species. Organism susceptible to Vancomycin and Ampicillin. Blood cultures negative x 2 sets.  Of total patient received 8 days of vancomycin and 7 days of diflucan iv. We will discontinue fluconazole and vancomycin as his cultures have been negative. He did not have any more fevers.   Neutropenic fever. Patient with history of follicular lymphoma, undergoing chemotherapy at the [facility], presented with a white count of 0.1. Now Resolved  Pancytopenia secondary to systemic chemotherapy. Patient transfused with 2 units of PRBC's and 2 units of PLT's on  08/06/13.  Transfused  3 units of platelets on 08/08/13.  Patient no longer neutropenic. Platelet count currently at 94,000.   Urinary Tract Infection. Urine cultures growing  enterococcus species, organism susceptible to Vancomycin and Ampicillin. He has completed 8 days of treatment.   Acute renal failure. suspected secondary to prerenal azotemia, Started him on IV fluids with normal saline at 75 mL per hour . His renal function is worsening despite, fluids. And he is more acidotic. His vancomycin trough is elevated. Discontinued the vancomycin. Obtained renal ultrasound which did not reveal any hydronephrosis. Fluid stopped as he was developing bilateral pleural effusions. Repeat values show much improvement.   Pulmonary embolism. He had a CT scan of lungs with contrast that showed a small pulmonary embolus and completely obstructing the left lower lobe superior segment pulmonary artery. He is hemodynamically stable, not an anticoagulation candidate given significant anemia and thrombocytopenia in setting of chemotherapy. Risks of anticoagulation outweigh benefits.   Hypokalemic.Marland Kitchen Repleted.          Stage II follicular lymphoma. Patient had been on salvage chemotherapy at the [facility] with ifosphamide,         carboplatin, etoposide, and rituxan. Care consulted for goals of care. After further discussion over night regarding CODE                 STATUS with his daughters at bedside, patient made a DO NOT RESUSCITATE per his wishes. Pt reported that he does not want chemo anymore and that he know s he is actively dying. Social worker consulted for [facility]. He has a bed available and he will be discharged. Family, his daughters are aware and at bedside  hypernatremia; possibly secondary to free water deficient.encourage po water intake. Repeat labs in am much improvement.     Procedures:  none  Consultations: Medical Oncology  Palliative care consult.    Discharge Exam: Filed Vitals:   08/16/13 0500  BP: 91/61  Pulse: 96  Temp: 97.4 F (36.3 C)  Resp: 20    General: alert afebrile comfortable Cardiovascular: s1s2 Respiratory:  ctab  Discharge Instructions  Discharge Orders   Future Appointments Provider Department Dept Phone   04/21/2014 10:00 AM [facility] [phone]   04/21/2014 10:30 AM Wyatt Portela, MD [facility] [phone]   Future Orders Complete By Expires   Discharge instructions  As directed    Comments:     Follow up with Dr Alen Blew as needed Follow up with PCP as needed.       Medication List    STOP taking these medications       aspirin EC 81 MG tablet     famciclovir 500 MG tablet  Commonly known as:  FAMVIR     magnesium oxide 400 MG tablet  Commonly known as:  MAG-OX     quiNINE 324 MG capsule  Commonly known as:  QUALAQUIN     simvastatin 20 MG tablet  Commonly known as:  ZOCOR      TAKE these medications       albuterol 108 (90 BASE) MCG/ACT inhaler  Commonly known as:  PROVENTIL HFA;VENTOLIN HFA  Inhale 2 puffs into the lungs every 6 (six) hours as needed for shortness of breath.     alfuzosin 10 MG 24 hr tablet  Commonly known as:  UROXATRAL  Take 10 mg by mouth every morning.     allopurinol 100 MG tablet  Commonly known as:  ZYLOPRIM  Take 1 tablet (100 mg total) by mouth daily.     dextromethorphan 30 MG/5ML liquid  Commonly known as:  DELSYM  Take 60 mg by mouth 2 (two) times daily.     esomeprazole 40 MG capsule  Commonly known as:  NEXIUM  Take 40 mg by mouth daily before breakfast.     Fluticasone-Salmeterol 500-50 MCG/DOSE Aepb  Commonly known as:  ADVAIR  Inhale 1 puff into the lungs 2 (two) times daily.     hyoscyamine 0.125 MG SL tablet  Commonly known as:  LEVSIN SL  dissolve 1 tablet under the tongue every 2 hours if needed for ABDOMINAL CRAMPS     ipratropium 0.02 % nebulizer solution  Commonly known as:  ATROVENT  Take 2.5 mLs (0.5 mg total) by nebulization every 4 (four) hours as needed for wheezing or shortness of breath.     levalbuterol 0.63  MG/3ML nebulizer solution  Commonly known as:  XOPENEX  Take 3 mLs (0.63 mg total) by nebulization every 6 (six) hours as needed for wheezing or shortness of breath.     lidocaine-prilocaine cream  Commonly known as:  EMLA  Apply 1 application topically as needed. Apply to port 1-2 hours before chemotherapy.     magic mouthwash Soln  Take 5 mLs by mouth 4 (four) times daily.     magnesium citrate Soln  Take 1 Bottle by mouth once.     Menthol (Topical Analgesic) 10 % Liqd  Apply 1 application topically 3 (three) times daily as needed (pain).     metoprolol succinate 25 MG 24 hr tablet  Commonly known as:  TOPROL-XL  Take 25 mg by mouth daily.     OxyCODONE 20 mg T12a 12 hr tablet  Commonly known as:  OXYCONTIN  Take 1 tablet (20 mg total) by mouth every 12 (twelve) hours.     oxyCODONE 5 MG immediate release tablet  Commonly known as:  Oxy IR/ROXICODONE  Take one to two tablets every four hours as needed.     potassium chloride SA 20 MEQ tablet  Commonly known as:  K-DUR,KLOR-CON  take 1 tablet by mouth once daily     PRESERVISION AREDS PO  Take 1 tablet by mouth 2 (two) times daily.     prochlorperazine 10 MG tablet  Commonly known as:  COMPAZINE  Take 1 tablet (10 mg total) by mouth every 6 (six) hours as needed for nausea or vomiting.     protein supplement Powd  Commonly known as:  UNJURY CHICKEN SOUP  Take 7 g (2 oz total) by mouth 2 (two) times daily before lunch and supper.     senna-docusate 8.6-50 MG per tablet  Commonly known as:  Senokot-S  Take 1 tablet by mouth 2 (two) times daily.     traZODone 50 MG tablet  Commonly known as:  DESYREL  Take 1 tablet (50 mg total) by mouth at bedtime as needed for sleep.       Allergies  Allergen Reactions  . Amoxicillin     REACTION: rash  . Latex Rash  . Penicillins Rash      The results of significant diagnostics from this hospitalization (including imaging, microbiology, ancillary and laboratory) are  listed below for reference.    Significant Diagnostic Studies: Dg Chest 1 View  08/06/2013   CLINICAL DATA:  Weakness  EXAM: CHEST - 1 VIEW  COMPARISON:  Chest radiograph and chest CT August 05, 2013.  FINDINGS: Central catheter tip is at the cavoatrial junction. No pneumothorax. There is a calcified granuloma in the left mid lung. There is no edema or consolidation. Heart size and pulmonary vascularity are normal. No adenopathy. There is arthropathy in both shoulders.  IMPRESSION: No pneumothorax. No edema or consolidation. Calcified granuloma left midlung.   Electronically Signed   By: Lowella Grip M.D.   On: 08/06/2013 08:09   Ct Angio Chest Pe W/cm &/or Wo Cm  08/05/2013   CLINICAL DATA:  Hemoptysis; history of lymphoma  EXAM: CT ANGIOGRAPHY CHEST WITH CONTRAST  TECHNIQUE: Multidetector CT imaging of the chest was performed using the standard protocol during bolus administration of intravenous contrast. Multiplanar CT image reconstructions and MIPs were obtained to evaluate the vascular anatomy.  CONTRAST:  131mL OMNIPAQUE IOHEXOL 350 MG/ML SOLN  COMPARISON:  Chest CT June 29, 2013 and chest radiograph August 05, 2013  FINDINGS: There is a small pulmonary embolus in the left superior segment lower lobe pulmonary artery. This finding is best seen on axial series 6 slice 123XX123. No larger pulmonary emboli are apparent. There is no thoracic aortic aneurysm or dissection.  There is patchy airspace consolidation in the left base. There is a degree of underlying emphysematous change.  On axial slice 48, series 7, there is an ill-defined nodular opacity which is new measuring 8 x 6 mm just lateral to the left hilum in the posterior segment of the left upper lobe. There is a calcified granuloma in the posterior segment of the left upper lobe which is stable.  There are small mediastinal lymph nodes but no adenopathy by size criteria. Port-A-Cath tip is in the superior vena cava. Pericardium is not  thickened.  In the visualized upper abdomen, there is atherosclerotic change in the aorta as well as calcified granulomas in the liver and spleen. No upper abdominal adenopathy is seen. There are no appreciable blastic or lytic bone lesions. Thyroid appears normal.  Review of the MIP images confirms the above findings.  IMPRESSION: Small pulmonary embolus incompletely obstructing the left lower lobe superior segment pulmonary artery. No other pulmonary emboli seen.  New nodular opacity in the left perihilar region measuring 8 x 6 mm. Given known lymphoma, a followup study in 10-12 weeks may be advised to further assess.  Evidence of prior granulomatous disease.  No adenopathy. Critical Value/emergent results were called by telephone at the time of interpretation on 08/05/2013 at 8:54 AM to Dr. Jinny Blossom, [facility] , who verbally acknowledged these results.   Electronically Signed   By: Lowella Grip M.D.   On: 08/05/2013 08:55   US Renal  08/13/2013   CLINICAL DATA:  Acute renal failure  EXAM: RENAL/URINARY TRACT ULTRASOUND COMPLETE  COMPARISON:  06/29/2013.  FINDINGS: Right Kidney:  Length: 12.3 cm.  Mild fullness of the collecting system is noted.  Left Kidney:  Length: 12.3 cm. A 2.9 cm cyst is noted this is stable from the prior CT examination.  Bladder:  Appears normal for degree of bladder distention.  Note is made of bilateral pleural effusions.  IMPRESSION: Left renal cyst.  Bilateral pleural effusions.   Electronically Signed   By: Inez Catalina M.D.   On: 08/13/2013 16:32   Dg Chest Port 1 View  08/12/2013   CLINICAL DATA:  Suspect aspiration pneumonia, history of COPD with swallowing dysfunction  EXAM: PORTABLE CHEST - 1 VIEW  COMPARISON:  DG CHEST 1 VIEW dated 08/06/2013  FINDINGS: The lungs are adequately inflated. There is mild stable  interstitial prominence. There is no evidence of atelectasis or pneumonia. A stable calcified nodule in the left mid lung is consistent with previous granulomatous  infection. The cardiopericardial silhouette is mildly enlarged. The pulmonary vascularity is not engorged. The Port-A-Cath appliance appears unchanged with the tip of the catheter in the region of the distal SVC. There is no pleural effusion. The trachea is midline. There are degenerative changes of the right shoulder.  IMPRESSION: There is no objective evidence of atelectasis or pneumonia. There are pulmonary interstitial changes secondary to underlying COPD and previous tobacco use.   Electronically Signed   By: David  Martinique   On: 08/12/2013 12:15   Dg Chest Portable 1 View  08/05/2013   CLINICAL DATA:  Lymphoma.  Spitting up blood.  EXAM: PORTABLE CHEST - 1 VIEW  COMPARISON:  CT chest, abdomen and pelvis 06/29/2013 and PA and lateral chest 06/01/2013.  FINDINGS: Port-A-Cath is in place. Punctate calcified granuloma left upper lobe is identified. The lungs are otherwise clear. Heart size is normal. No pneumothorax or pleural effusion.  IMPRESSION: No acute disease.   Electronically Signed   By: Inge Rise M.D.   On: 08/05/2013 07:40   Dg Swallowing Func-speech Pathology  08/09/2013   Earle Gell McCoy, CCC-SLP     08/09/2013  3:33 PM Objective Swallowing Evaluation: Modified Barium Swallowing Study   Patient Details  Name: KERIN CECCHI MRN: [id number] Date of Birth: May 24, 1929  Today's Date: 08/09/2013 Time: 3244-0102 SLP Time Calculation (min): 20 min  Past Medical History:  Past Medical History  Diagnosis Date  . GERD (gastroesophageal reflux disease)   . Osteoarthritis   . Alcohol abuse     in recovery 2006  . 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 wine only  . Irritable bowel   . Hyperlipidemia   . Insomnia   . PAC (premature atrial contraction)     stable (ruled out for a fib)  . Prostatitis     chronic bacterial  . Pseudogout   . Sinus problem     chronic  . Cellulitis     2nd to T. Pedis(not at present)  . Hearing loss 03-10-12    bilateral hearing aids  .  Complication of anesthesia 03-10-12    after shoulder  surgery -difficulty awakening,breathing  problems  . Emphysema 03-10-12    tx. inhalers, uses steam room frequently  . Cramping of feet 03-10-12    cramping of both legs and hands occ.  . Osteoarthritis of left knee 03/17/2012  . Glaucoma   . Macular degeneration   . Hypertension   . Shortness of breath   . Non Hodgkin's lymphoma 03-10-12     '07-1 yr. in remission(Shadad)-not seeing now   Past Surgical History:  Past Surgical History  Procedure Laterality Date  . Esophagogastroduodenoscopy  01-27-02  . Inguinal hernia repair  1979  . Torn biceps repair  03-10-12    Left rotator cuff repair  . Hernia repair    . Cataract surgery  03-10-12    03-04-12(right)/ (03-11-12-left)  . Total knee arthroplasty  03/17/2012    Procedure: TOTAL KNEE ARTHROPLASTY;  Surgeon: Johnny Bridge,  MD;  Location: [facility];  Service: Orthopedics;  Laterality: Left;  Marland Kitchen Eye surgery  2014    cataract extraction with IOL both eyes staged  . Colonoscopy    . Rotator cuff repair Left   . Total knee arthroplasty Right 03/22/2013    Procedure: TOTAL KNEE  ARTHROPLASTY;  Surgeon: Johnny Bridge,  MD;  Location: [facility];  Service: Orthopedics;  Laterality: Right;   righ total knee arthroplasty   HPI:  Patient is a pleasant 80 year old man with a past medical history  of stage II follicular lymphoma that was diagnosed in 2007,  currently undergoing salvage chemotherapy with ifosfamide,  carboplatin, etoposide and Rituxan at the [facility] He  presented to the emergency room on 08/05/2013 with complaints of  cough, fevers, sputum production as well as minimal amount of  hemoptysis. Symptoms had worsened over the past 2-3 days. Initial  lab work in the emergency room showed a white count of 0.1 with  hemoglobin of 6.6 and platelet count of 5. Patient was seen and  evaluated by his medical oncologist Dr Alen Blew. He was transfused  with platelets overnight. He was also started on broad-spectrum  IV antibiotic  therapy with vancomycin, aztreonam and Levaquin.  Acute diagnosis include relapsed diffuse large cell lymphoma  presented with abdominal adenopathy and lower extremity edema,  pancytopenia related to systemic chemotherapy, neutropenic  sepsis, small pulmonary embolus.      Assessment / Plan / Recommendation Clinical Impression  Dysphagia Diagnosis: Moderate pharyngeal phase dysphagia;Severe  pharyngeal phase dysphagia Clinical impression: Patient presents with a moderate-severe  oropharyngeal dysphagia with both sensory and motor components.  Delayed swallow initiation combined with laryngeal weakness  results in decreased airway protection with thin liquids.  Moderate amounts of aspiration noted, initially with strong cough  response however subsequently silent, not clearing despite cueing  for throat clearing, coughing, or prevented despite max cues for  use of chin tuck which patient had a difficulty time  coordinating. Patient better able to protect airway with nectar  thick liquids and solid pos trials however mild-moderate  pharyngeal residuals noted post swallow (primarily in the  vallecula) which patient is unaware of, further increasing  aspiration risk.     Treatment Recommendation  Therapy as outlined in treatment plan below    Diet Recommendation Dysphagia 3 (Mechanical Soft);Thin liquid  (family to provide thickened liquids as they wish)   Liquid Administration via: Cup;No straw Medication Administration: Whole meds with puree Supervision: Patient able to self feed;Full supervision/cueing  for compensatory strategies Compensations: Small sips/bites;Slow rate;Multiple dry swallows  after each bite/sip Postural Changes and/or Swallow Maneuvers: Seated upright 90  degrees;Upright 30-60 min after meal    Other  Recommendations Recommended Consults: MBS Oral Care Recommendations: Oral care BID   Follow Up Recommendations   (TBD)    Frequency and Duration min 3x week  2 weeks           General HPI: Patient is  a pleasant 80 year old man with a past  medical history of stage II follicular lymphoma that was  diagnosed in 2007, currently undergoing salvage chemotherapy with  ifosfamide, carboplatin, etoposide and Rituxan at the [facility]. He presented to the emergency room on 08/05/2013 with  complaints of cough, fevers, sputum production as well as minimal  amount of hemoptysis. Symptoms had worsened over the past 2-3  days. Initial lab work in the emergency room showed a white count  of 0.1 with hemoglobin of 6.6 and platelet count of 5. Patient  was seen and evaluated by his medical oncologist Dr Alen Blew. He  was transfused with platelets overnight. He was also started on  broad-spectrum IV antibiotic therapy with vancomycin, aztreonam  and Levaquin. Acute diagnosis include relapsed diffuse large cell  lymphoma presented with abdominal adenopathy and lower extremity  edema, pancytopenia related to systemic chemotherapy, neutropenic  sepsis, small pulmonary embolus.  Type of Study: Modified Barium Swallowing Study Reason for Referral: Objectively evaluate swallowing function Previous Swallow Assessment: none Diet Prior to this Study: Dysphagia 3 (soft);Thin liquids Temperature Spikes Noted: No Respiratory Status: Room air History of Recent Intubation: No Behavior/Cognition: Alert;Cooperative;Pleasant mood Oral Cavity - Dentition: Adequate natural dentition Oral Motor / Sensory Function: Within functional limits Self-Feeding Abilities: Able to feed self Patient Positioning: Upright in chair Baseline Vocal Quality: Hoarse;Breathy;Low vocal intensity Volitional Cough: Weak Volitional Swallow: Able to elicit Anatomy: Within functional limits Pharyngeal Secretions: Not observed secondary MBS    Reason for Referral Objectively evaluate swallowing function   Oral Phase Oral Preparation/Oral Phase Oral Phase: WFL   Pharyngeal Phase Pharyngeal Phase Pharyngeal Phase: Impaired Pharyngeal - Nectar Pharyngeal - Nectar Cup: Delayed  swallow initiation;Premature  spillage to pyriform sinuses;Reduced anterior laryngeal  mobility;Reduced laryngeal elevation;Reduced tongue base  retraction;Pharyngeal residue - valleculae;Pharyngeal residue -  pyriform sinuses Pharyngeal - Thin Pharyngeal - Thin Teaspoon: Delayed swallow initiation;Premature  spillage to pyriform sinuses;Reduced anterior laryngeal  mobility;Reduced laryngeal elevation;Reduced tongue base  retraction;Pharyngeal residue - valleculae;Pharyngeal residue -  pyriform sinuses;Penetration/Aspiration before  swallow;Penetration/Aspiration during  swallow;Penetration/Aspiration after swallow;Compensatory  strategies attempted (Comment);Moderate aspiration (chin tuck  ineffective) Penetration/Aspiration details (thin teaspoon): Material enters  airway, passes BELOW cords without attempt by patient to eject  out (silent aspiration) Pharyngeal - Thin Cup: Delayed swallow initiation;Premature  spillage to pyriform sinuses;Reduced anterior laryngeal  mobility;Reduced laryngeal elevation;Reduced tongue base  retraction;Pharyngeal residue - valleculae;Pharyngeal residue -  pyriform sinuses;Penetration/Aspiration before  swallow;Penetration/Aspiration during  swallow;Penetration/Aspiration after swallow;Significant  aspiration (Amount) Penetration/Aspiration details (thin cup): Material enters  airway, passes BELOW cords and not ejected out despite cough  attempt by patient;Material enters airway, passes BELOW cords  without attempt by patient to eject out (silent aspiration) Pharyngeal - Solids Pharyngeal - Puree: Delayed swallow initiation;Reduced anterior  laryngeal mobility;Reduced laryngeal elevation;Reduced tongue  base retraction;Pharyngeal residue - valleculae;Premature  spillage to valleculae Pharyngeal - Mechanical Soft: Delayed swallow initiation;Reduced  anterior laryngeal mobility;Reduced laryngeal elevation;Reduced  tongue base retraction;Premature spillage to  valleculae;Pharyngeal  residue - valleculae Pharyngeal - Pill: Delayed swallow initiation;Reduced anterior  laryngeal mobility;Reduced laryngeal elevation;Reduced tongue  base retraction (patient partially masticated pill provided whole  in puree)  Cervical Esophageal Phase    GO   Gabriel Rainwater MA, CCC-SLP [phone]  Cervical Esophageal Phase Cervical Esophageal Phase: [facility]         McCoy Leah Meryl 08/09/2013, 3:33 PM     Microbiology: No results found for this or any previous visit (from the past 240 hour(s)).   Labs: Basic Metabolic Panel:  Recent Labs Lab 08/11/13 0530 08/12/13 1105 08/13/13 0519 08/14/13 0535 08/15/13 1004  NA 140 143 143 150* 145  K 3.9 3.9 3.8 3.2* 3.1*  CL 106 108 109 113* 108  CO2 18* 18* 17* 19 24  GLUCOSE 120* 117* 103* 100* 147*  BUN 35* 46* 49* 41* 22  CREATININE 1.40* 1.95* 2.01* 1.66* 1.24  CALCIUM 8.7 8.8 8.6 8.9 8.7   Liver Function Tests: No results found for this basename: AST, ALT, ALKPHOS, BILITOT, PROT, ALBUMIN,  in the last 168 hours No results found for this basename: LIPASE, AMYLASE,  in the last 168 hours No results found for this basename: AMMONIA,  in the last 168 hours CBC:  Recent Labs Lab 08/10/13 0540 08/11/13 0530 08/12/13 1105 08/15/13 1004  WBC 5.6 8.2 11.5* 9.8  NEUTROABS  --   --  9.4* 7.0  HGB 8.9* 9.0* 9.5* 9.2*  HCT 26.2* 27.2* 29.1* 27.9*  MCV 79.9 81.2 82.2 82.1  PLT 30* 27* 45* 94*   Cardiac Enzymes: No results found for this basename: CKTOTAL, CKMB, CKMBINDEX, TROPONINI,  in the last 168 hours BNP: BNP (last 3 results) No results found for this basename: PROBNP,  in the last 8760 hours CBG: No results found for this basename: GLUCAP,  in the last 168 hours     Signed:  Makaylah Oddo  [facility] 08/16/2013, 8:32 AM

## 2013-08-16 NOTE — Telephone Encounter (Signed)
Patient was admitted to [facility] today.  If further orders required, please advise.  CB# at [place]

## 2013-08-16 NOTE — Progress Notes (Signed)
IP PROGRESS NOTE  Subjective:   Events noted. He more awake today still very weak and able to eat much.   Objective:  Vital signs in last 24 hours: Temp:  [97.4 F (36.3 C)-98.4 F (36.9 C)] 97.4 F (36.3 C) (02/23 0500) Pulse Rate:  [96-101] 96 (02/23 0500) Resp:  [20] 20 (02/23 0500) BP: (91-137)/(61-86) 91/61 mmHg (02/23 0500) SpO2:  [99 %-100 %] 99 % (02/23 0500) Weight:  [184 lb 11.9 oz (83.8 kg)] 184 lb 11.9 oz (83.8 kg) (02/23 0500) Weight change:  Last BM Date: 08/11/13  Intake/Output from previous day: 02/22 0701 - 02/23 0700 In: 3360 [P.O.:2160; I.V.:1200] Out: 2100 [Urine:2100]  Mouth: mucous membranes moist, pharynx normal without lesions  Extremities: Left leg edema much improved.  Decrease redness.   Portacath without erythema  Lab Results:  Recent Labs  08/15/13 1004  WBC 9.8  HGB 9.2*  HCT 27.9*  PLT 94*    BMET  Recent Labs  08/14/13 0535 08/15/13 1004  NA 150* 145  K 3.2* 3.1*  CL 113* 108  CO2 19 24  GLUCOSE 100* 147*  BUN 41* 22  CREATININE 1.66* 1.24  CALCIUM 8.9 8.7     Medications: I have reviewed the patient's current medications.  Assessment/Plan:  80 year old gentleman with the following issues:  1. Relapsed diffuse large cell lymphoma presented with abdominal adenopathy and lower extremity edema. He status post salvage chemotherapy utilizing ICE with rituximab. He received the first cycle of chemotherapy and did well for the most part but now presenting with multiple complications related to that.  He wishes not to have further chemotherapy for the time being. I discussed the implications of this decision with him and his family today. I certainly support his decision given his overall decline. I also told him if things change, we can certainly revisit this is incision in the future.   2. Pancytopenia: This is related to systemic chemotherapy this has improved dramatically at this time.  3. Neutropenic sepsis: Appears  to be clinically better at this time.  4. Small pulmonary embolus on his CT scan: His risk of bleeding is very high at this point and I favor holding any anticoagulation for the time being. I would probably consider that if his platelet counts above 100,000.  5. Pain: Under control at this time.  6. Disposition: He will be discharged to residential hospice and possibly transitioned to home.   LOS: 11 days   CNOBSJ,GGEZM 08/16/2013, 9:06 AM

## 2013-08-20 NOTE — Progress Notes (Signed)
   Subjective:    Patient ID: Jack [name], male    DOB: 08/10/1928, 80 y.o.   MRN: [id number]  HPI Admission note to [facility], residential Hospice/nursing home. Please see scanned note.    Review of Systems     Objective:   Physical Exam        Assessment & Plan:  Supportive care.

## 2013-08-24 ENCOUNTER — Telehealth: Payer: Self-pay | Admitting: *Deleted

## 2013-08-24 NOTE — Telephone Encounter (Signed)
Nira Conn, from [facility] phoned as a FYI courtesy to notify PCP that pt has been transferred/discharged home.

## 2013-08-25 ENCOUNTER — Encounter: Payer: Self-pay | Admitting: Internal Medicine

## 2013-08-25 DIAGNOSIS — M1120 Other chondrocalcinosis, unspecified site: Secondary | ICD-10-CM

## 2013-08-25 DIAGNOSIS — N179 Acute kidney failure, unspecified: Secondary | ICD-10-CM

## 2013-08-25 DIAGNOSIS — I2699 Other pulmonary embolism without acute cor pulmonale: Secondary | ICD-10-CM

## 2013-08-25 DIAGNOSIS — B952 Enterococcus as the cause of diseases classified elsewhere: Secondary | ICD-10-CM

## 2013-08-25 DIAGNOSIS — L0291 Cutaneous abscess, unspecified: Secondary | ICD-10-CM

## 2013-08-25 DIAGNOSIS — L0390 Cellulitis, unspecified: Secondary | ICD-10-CM

## 2013-08-25 DIAGNOSIS — N390 Urinary tract infection, site not specified: Secondary | ICD-10-CM

## 2013-08-25 DIAGNOSIS — C8589 Other specified types of non-Hodgkin lymphoma, extranodal and solid organ sites: Secondary | ICD-10-CM

## 2013-08-25 DIAGNOSIS — J438 Other emphysema: Secondary | ICD-10-CM

## 2013-08-25 DIAGNOSIS — K219 Gastro-esophageal reflux disease without esophagitis: Secondary | ICD-10-CM

## 2013-08-25 NOTE — Assessment & Plan Note (Signed)
Taking Nexium daily. No c/o GERD/reflux

## 2013-08-25 NOTE — Assessment & Plan Note (Signed)
Stable, no SOB or increased WOB. Was never anticoagulated due to low platlet count. Using O2  Plan Continue o2

## 2013-08-25 NOTE — Assessment & Plan Note (Signed)
Patient with recent flare of gout right thumb despite allopurinol. He did respond well to colchicine.

## 2013-08-25 NOTE — Assessment & Plan Note (Signed)
Stble. Using Advair diskus twice a day, prn neubulizer treatments. Today he did not require rescue treatment

## 2013-08-25 NOTE — Assessment & Plan Note (Signed)
At time of hospital d/c creatinine was improving:  BMET    Component Value Date/Time   NA 145 08/15/2013 1004   NA 132* 07/27/2013 0840   K 3.1* 08/15/2013 1004   K 4.4 07/27/2013 0840   CL 108 08/15/2013 1004   CL 104 04/22/2012 1310   CO2 24 08/15/2013 1004   CO2 24 07/27/2013 0840   GLUCOSE 147* 08/15/2013 1004   GLUCOSE 131 07/27/2013 0840   GLUCOSE 108* 04/22/2012 1310   BUN 22 08/15/2013 1004   BUN 11.8 07/27/2013 0840   CREATININE 1.24 08/15/2013 1004   CREATININE 0.6* 07/27/2013 0840   CALCIUM 8.7 08/15/2013 1004   CALCIUM 9.6 07/27/2013 0840   GFRNONAA 52* 08/15/2013 1004   GFRAA 61* 08/15/2013 1004

## 2013-08-25 NOTE — Assessment & Plan Note (Signed)
Recovering from pancytopenia and complications of last round of chemo. It is my understanding that no further chemo is planned at this time. Working with Jack [name] on acceptance of his diagnosis and to continue with hospice/palliative care. Dr. Alen Blew has seen Jack [name] at [facility] - no plans for treatment at this time. Pain is well controlled on oxycontin with oxyfast as needed. Reviewed administration and dosing with daughter and sitter.

## 2013-08-25 NOTE — Progress Notes (Signed)
Subjective:    Patient ID: Jack [name], male    DOB: 16-May-1929, 80 y.o.   MRN: [id number]  HPI Home visit: [name] suffers with large cell lymphoma as a recurrence. He was recently hospitalized after chemotherapy for pan cytopenia, cellulitis, thrush, UTI with fever and profound weakness. He was d/c to [facility] for about a week. He was able to be d/c'd to home Tuesday, March 3rd.  At home [name] is resting comfortably in a hospital bed set up in the den. His daughters continue to be primary supporters but there is a full time day sitter and sitters at night. [name] remains very weak, needing 2+ assist to stand or transfer. Over the past 24 hours he has done much better: good appetite, BM 08/24/13, only required  2 doses of oxyfast 0.25 ml for break-thru pain and he has not needed any nebulizer treatments today.   [name] is awake and alert. He wants to be out of the bed and more active. He denies any discomfort at the time of this visit.   Past Medical History  Diagnosis Date  . GERD (gastroesophageal reflux disease)   . Osteoarthritis   . Alcohol abuse     in recovery 2006  . 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 wine only  . Irritable bowel   . Hyperlipidemia   . Insomnia   . PAC (premature atrial contraction)     stable (ruled out for a fib)  . Prostatitis     chronic bacterial  . Pseudogout   . Sinus problem     chronic  . Cellulitis     2nd to T. Pedis(not at present)  . Hearing loss 03-10-12    bilateral hearing aids  . Complication of anesthesia 03-10-12    after shoulder  surgery -difficulty awakening,breathing problems  . Emphysema 03-10-12    tx. inhalers, uses steam room frequently  . Cramping of feet 03-10-12    cramping of both legs and hands occ.  . Osteoarthritis of left knee 03/17/2012  . Glaucoma   . Macular degeneration   . Hypertension   . Shortness of breath   . Non Hodgkin's lymphoma 03-10-12       '07-1 yr. in remission([facility])-not seeing now   Past Surgical History  Procedure Laterality Date  . Esophagogastroduodenoscopy  01-27-02  . Inguinal hernia repair  1979  . Torn biceps repair  03-10-12    Left rotator cuff repair  . Hernia repair    . Cataract surgery  03-10-12    03-04-12(right)/ (03-11-12-left)  . Total knee arthroplasty  03/17/2012    Procedure: TOTAL KNEE ARTHROPLASTY;  Surgeon: Johnny Bridge, MD;  Location: [facility];  Service: Orthopedics;  Laterality: Left;  Marland Kitchen Eye surgery  2014    cataract extraction with IOL both eyes staged  . Colonoscopy    . Rotator cuff repair Left   . Total knee arthroplasty Right 03/22/2013    Procedure: TOTAL KNEE ARTHROPLASTY;  Surgeon: Johnny Bridge, MD;  Location: [facility];  Service: Orthopedics;  Laterality: Right;  righ total knee arthroplasty   Family History  Problem Relation Age of Onset  . Coronary artery disease Father   . Heart attack Father   . Cancer Brother     colon  . Other Other     TB   History   Social History  . Marital Status: Widowed    Spouse Name:  N/A    Number of Children: 3  . Years of Education: 18   Occupational History  . 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 Smoking status: Former Smoker    Types: Cigarettes    Quit date: 06/24/1978  . Smokeless tobacco: Never Used  . Alcohol Use: 6.7 oz/week    7 Glasses of wine, 5 Drinks containing 0.5 oz of alcohol per week     Comment: Currently only drinks wine - in moderation. He is cutting back-past hx. ETOH abuse  . Drug Use: No  . Sexual Activity: Yes   Other Topics Concern  . Not on file   Social History Narrative   [employer]. married '56- widowed '07. 1 son, 2 daughters, 4 grandchildren   work: former Clinical biochemist; very active in civic affairs. lives alone: completed  [place] continues to [place] improvement ('12), keeps up his beach house; takes his extended family  on great trips - [place] and [place] July '12. Socially active.  Active in AA      End of Life issues: He does want CPR; no prolonged intubation; no futile or heroic measure to maintain him if the quality of life isn't good. [place]  Son - Jack [name] ([phone].    Current Outpatient Prescriptions on File Prior to Visit  Medication Sig Dispense Refill  . albuterol (PROVENTIL HFA;VENTOLIN HFA) 108 (90 BASE) MCG/ACT inhaler Inhale 2 puffs into the lungs every 6 (six) hours as needed for shortness of breath.       . alfuzosin (UROXATRAL) 10 MG 24 hr tablet Take 10 mg by mouth every morning.       Marland Kitchen allopurinol (ZYLOPRIM) 100 MG tablet Take 1 tablet (100 mg total) by mouth daily.  30 tablet  5  . Alum & Mag Hydroxide-Simeth (MAGIC MOUTHWASH) SOLN Take 5 mLs by mouth 4 (four) times daily.  250 mL  0  . dextromethorphan (DELSYM) 30 MG/5ML liquid Take 60 mg by mouth 2 (two) times daily.      Marland Kitchen esomeprazole (NEXIUM) 40 MG capsule Take 40 mg by mouth daily before breakfast.      . Fluticasone-Salmeterol (ADVAIR) 500-50 MCG/DOSE AEPB Inhale 1 puff into the lungs 2 (two) times daily.      . hyoscyamine (LEVSIN SL) 0.125 MG SL tablet dissolve 1 tablet under the tongue every 2 hours if needed for ABDOMINAL CRAMPS  48 tablet  1  . ipratropium (ATROVENT) 0.02 % nebulizer solution Take 2.5 mLs (0.5 mg total) by nebulization every 4 (four) hours as needed for wheezing or shortness of breath.  75 mL  12  . levalbuterol (XOPENEX) 0.63 MG/3ML nebulizer solution Take 3 mLs (0.63 mg total) by nebulization every 6 (six) hours as needed for wheezing or shortness of breath.  3 mL  12  . lidocaine-prilocaine (EMLA) cream Apply 1 application topically as needed. Apply to port 1-2 hours before chemotherapy.  30 g  1  . magnesium citrate SOLN Take 1 Bottle by mouth once.      . Menthol, Topical Analgesic, 10 % LIQD Apply 1 application topically 3 (three) times daily as needed (pain).      . metoprolol succinate  (TOPROL-XL) 25 MG 24 hr tablet Take 25 mg by mouth daily.      . Multiple Vitamins-Minerals (PRESERVISION AREDS PO) Take 1 tablet by mouth 2 (two) times daily.      Marland Kitchen oxyCODONE (OXY  IR/ROXICODONE) 5 MG immediate release tablet Take one to two tablets every four hours as needed.  60 tablet  0  . OxyCODONE (OXYCONTIN) 20 mg T12A 12 hr tablet Take 1 tablet (20 mg total) by mouth every 12 (twelve) hours.  60 tablet  0  . potassium chloride SA (K-DUR,KLOR-CON) 20 MEQ tablet take 1 tablet by mouth once daily  30 tablet  5  . prochlorperazine (COMPAZINE) 10 MG tablet Take 1 tablet (10 mg total) by mouth every 6 (six) hours as needed for nausea or vomiting.  30 tablet  2  . protein supplement (UNJURY CHICKEN SOUP) POWD Take 7 g (2 oz total) by mouth 2 (two) times daily before lunch and supper.      . senna-docusate (SENOKOT-S) 8.6-50 MG per tablet Take 1 tablet by mouth 2 (two) times daily.  60 tablet  1  . traZODone (DESYREL) 50 MG tablet Take 1 tablet (50 mg total) by mouth at bedtime as needed for sleep.  30 tablet  3   No current facility-administered medications on file prior to visit.   Reviewed med list from hospice: no magnesium, no trazodone. Taking nexium. Instructed care-takers in syringe use for oral oxyfast.   Review of Systems No fevers, chills, sweats. No acute respiratory problems, no chest pain. Good appetite, no N/V     Objective:   Physical Exam Warm and dry Gen'l - older man with very thin hair who appears weak. HEENT- [place]/AT, balding, C&S clear, oropharynx clear, no oral thrust Cor 2+ radial, RRR Pulm - no increased WOB despite being off oxygen. Lungs CTAP Chest - portacath site right anterior chest Abd - soft, BS+, not tender Ext - left leg with diffuse 2+ swelling with 1+ pitting edema. Right thumb w/o erythema or tenderness. Derm - left leg with minimal rubor, no heat, no tenderness, no open lesions       Assessment & Plan:

## 2013-08-25 NOTE — Assessment & Plan Note (Signed)
Left leg with swelling but no heat, redness or tenderness

## 2013-08-25 NOTE — Assessment & Plan Note (Signed)
Stble with no fever. Still with condom cath. No evidence of infection

## 2013-08-30 ENCOUNTER — Telehealth: Payer: Self-pay | Admitting: *Deleted

## 2013-08-30 MED ORDER — PROMETHAZINE HCL 25 MG RE SUPP: 25.0000 mg | Freq: Four times a day (QID) | RECTAL | Status: AC | PRN

## 2013-08-30 NOTE — Telephone Encounter (Signed)
Jack [name], of [facility] phoned requesting PT Eval order for assistance in teaching family how to transfer patient.  Please advise  CB# [phone]  Fax [phone]

## 2013-08-30 NOTE — Telephone Encounter (Signed)
Order for [facility] for PT eval and treat entered last week. Please check on status of that order.

## 2013-08-30 NOTE — Telephone Encounter (Signed)
Arbie Cookey, with [facility], phoned stating that patient was d/c'ed from hospital with promethazine 25 mg PR q6 prn but does not have any & is requesting script be sent to [facility] on [address].  Not shown on MAR.  Please advise.  CB# [phone]

## 2013-08-30 NOTE — Telephone Encounter (Signed)
done

## 2013-08-30 NOTE — Telephone Encounter (Signed)
Phoned & left voicemail message for [facility] that PT order had been placed 08/26/13 with [place].

## 2013-08-31 ENCOUNTER — Encounter: Payer: Self-pay | Admitting: *Deleted

## 2013-08-31 ENCOUNTER — Telehealth: Payer: Self-pay

## 2013-08-31 NOTE — Telephone Encounter (Signed)
Phone call from Arbie Cookey [phone] with [facility]. She saw patient today and states he is complaining of sores in his mouth. He does have white patches and is using magic mouth wash that he had. He uses [facility] on [address]. Please advise.

## 2013-08-31 NOTE — Telephone Encounter (Signed)
Continue magic mouthwash. Rx sent for fluconazole 100 mg daily x 10 days Ask if tomorrow (Weds) evening is ok for a home visit.

## 2013-08-31 NOTE — Telephone Encounter (Signed)
Phoned and left voicemail message for Jack [name] that request script had been sent in to requested pharmacy.

## 2013-09-01 NOTE — Telephone Encounter (Signed)
Spoke to patient's daughter and they are continuing magic mouth wash. I let her know rx was sent in and Wed is good for a home visit.

## 2013-09-02 ENCOUNTER — Telehealth: Payer: Self-pay | Admitting: *Deleted

## 2013-09-02 NOTE — Telephone Encounter (Signed)
[place] RN phoned for update for mouthwash.  Informed her that Jack [name] had already notified family & scripts sent in.

## 2013-09-03 ENCOUNTER — Telehealth: Payer: Self-pay

## 2013-09-03 ENCOUNTER — Telehealth: Payer: Self-pay | Admitting: *Deleted

## 2013-09-03 MED ORDER — OXYCODONE HCL 20 MG/ML PO CONC: 5.0000 mg | ORAL | Status: AC | PRN

## 2013-09-03 MED ORDER — LORAZEPAM 1 MG PO TABS: 1.0000 mg | ORAL_TABLET | Freq: Three times a day (TID) | ORAL | Status: AC

## 2013-09-03 NOTE — Telephone Encounter (Signed)
[facility] called [phone] stating the Oxycodone 20 mg /ml is not in stock. Other [facility] locations and [facility] have been contacted and none of the pharmacies have this in [place]. Please advise.

## 2013-09-03 NOTE — Telephone Encounter (Signed)
rx's have been faxed to [facility] [phone]  Med list has been updated

## 2013-09-03 NOTE — Telephone Encounter (Signed)
rx done. Please change med rec to be the same as the hospice med list. THANK YOU

## 2013-09-03 NOTE — Telephone Encounter (Signed)
Arbie Cookey, hospice RN, phoned requesting refills for meds (ativan & oxycodone liquid).  Faxing updated post hospital d/c MAR to [facility] with updated meds and also with requested refills.

## 2013-09-03 NOTE — Telephone Encounter (Signed)
Received a fax from hospice and [facility] requesting refills on Oxycodone concentrate 20 mg/ml and Lorazepam increase from 0.5 mg to 1 mg every 4 hours prn (do not see Lorazepam on med list). Patient uses [facility]

## 2013-09-06 MED ORDER — OXYCODONE HCL 5 MG PO TABS: 5.0000 mg | ORAL_TABLET | ORAL | Status: AC | PRN

## 2013-09-06 NOTE — Telephone Encounter (Signed)
We can try oxy IR for now. See Rx. The pharmacy should please order the liquid concentrate because he will get to where he cannot swallow pills.

## 2013-09-06 NOTE — Telephone Encounter (Signed)
Script has been faxed to [facility] [phone]

## 2013-09-15 ENCOUNTER — Telehealth: Payer: Self-pay | Admitting: *Deleted

## 2013-09-15 NOTE — Telephone Encounter (Signed)
Arbie Cookey from [facility] called states pt is experiencing increased agitation.  Pt is on Lorazepam PRN every 6 hours however he is still having agitation.  She is also requesting Lorazepam liquid be prescribed due to difficulty swallowing.  Please advise

## 2013-09-15 NOTE — Telephone Encounter (Signed)
For this purpose I am retired and my hospice doctor colleagues will need to take over.

## 2013-09-16 NOTE — Telephone Encounter (Signed)
Left detailed message on [facility] identified VM.

## 2013-09-27 ENCOUNTER — Telehealth: Payer: Self-pay | Admitting: Internal Medicine

## 2013-09-27 NOTE — Telephone Encounter (Signed)
Yes thanks 

## 2013-09-27 NOTE — Telephone Encounter (Signed)
Wendy with [facility] is calling to find out if it is okay with Dr. Asa Lente is their physicians can assist with symptom management. Please call back to confirm. Ph# [phone]

## 2013-09-27 NOTE — Telephone Encounter (Signed)
Notified Abigail Butts with md response.../lm,b

## 2013-10-07 ENCOUNTER — Telehealth: Payer: Self-pay | Admitting: Internal Medicine

## 2013-10-07 NOTE — Telephone Encounter (Signed)
Rec'd death certificate from [facility]. Dr. Asa Lente signed, I called funeral Home to pick up 10/07/2013

## 2013-10-22 DEATH — deceased

## 2014-02-15 ENCOUNTER — Other Ambulatory Visit: Payer: Self-pay | Admitting: *Deleted

## 2014-04-21 ENCOUNTER — Other Ambulatory Visit: Payer: Medicare Other

## 2014-04-21 ENCOUNTER — Ambulatory Visit: Payer: Medicare Other | Admitting: Oncology

## 2015-02-17 IMAGING — CR DG KNEE 1-2V PORT*R*
2 series · 2 of 2 positions shown · non-contrast
Comparison: None.

CLINICAL DATA: The osteoarthritis.

EXAM:
PORTABLE RIGHT KNEE - 1-2 VIEW

[AP]
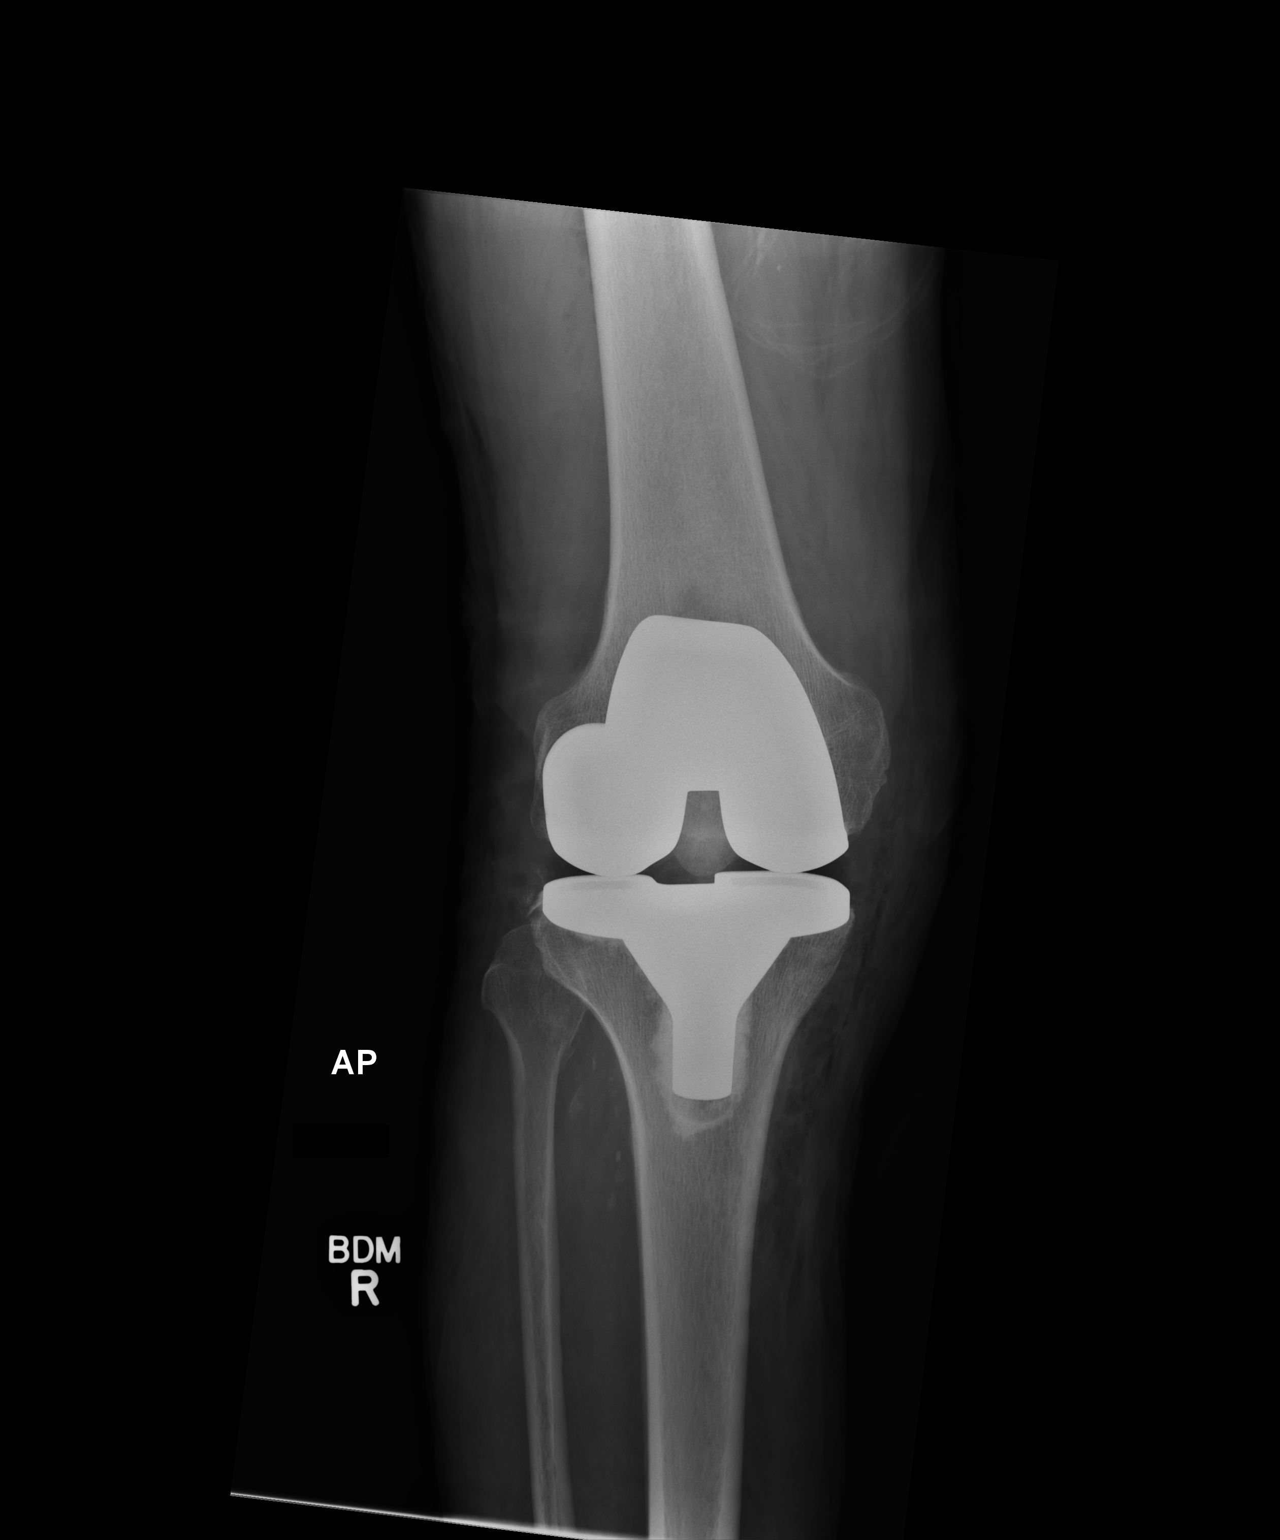

[xtable lateral]
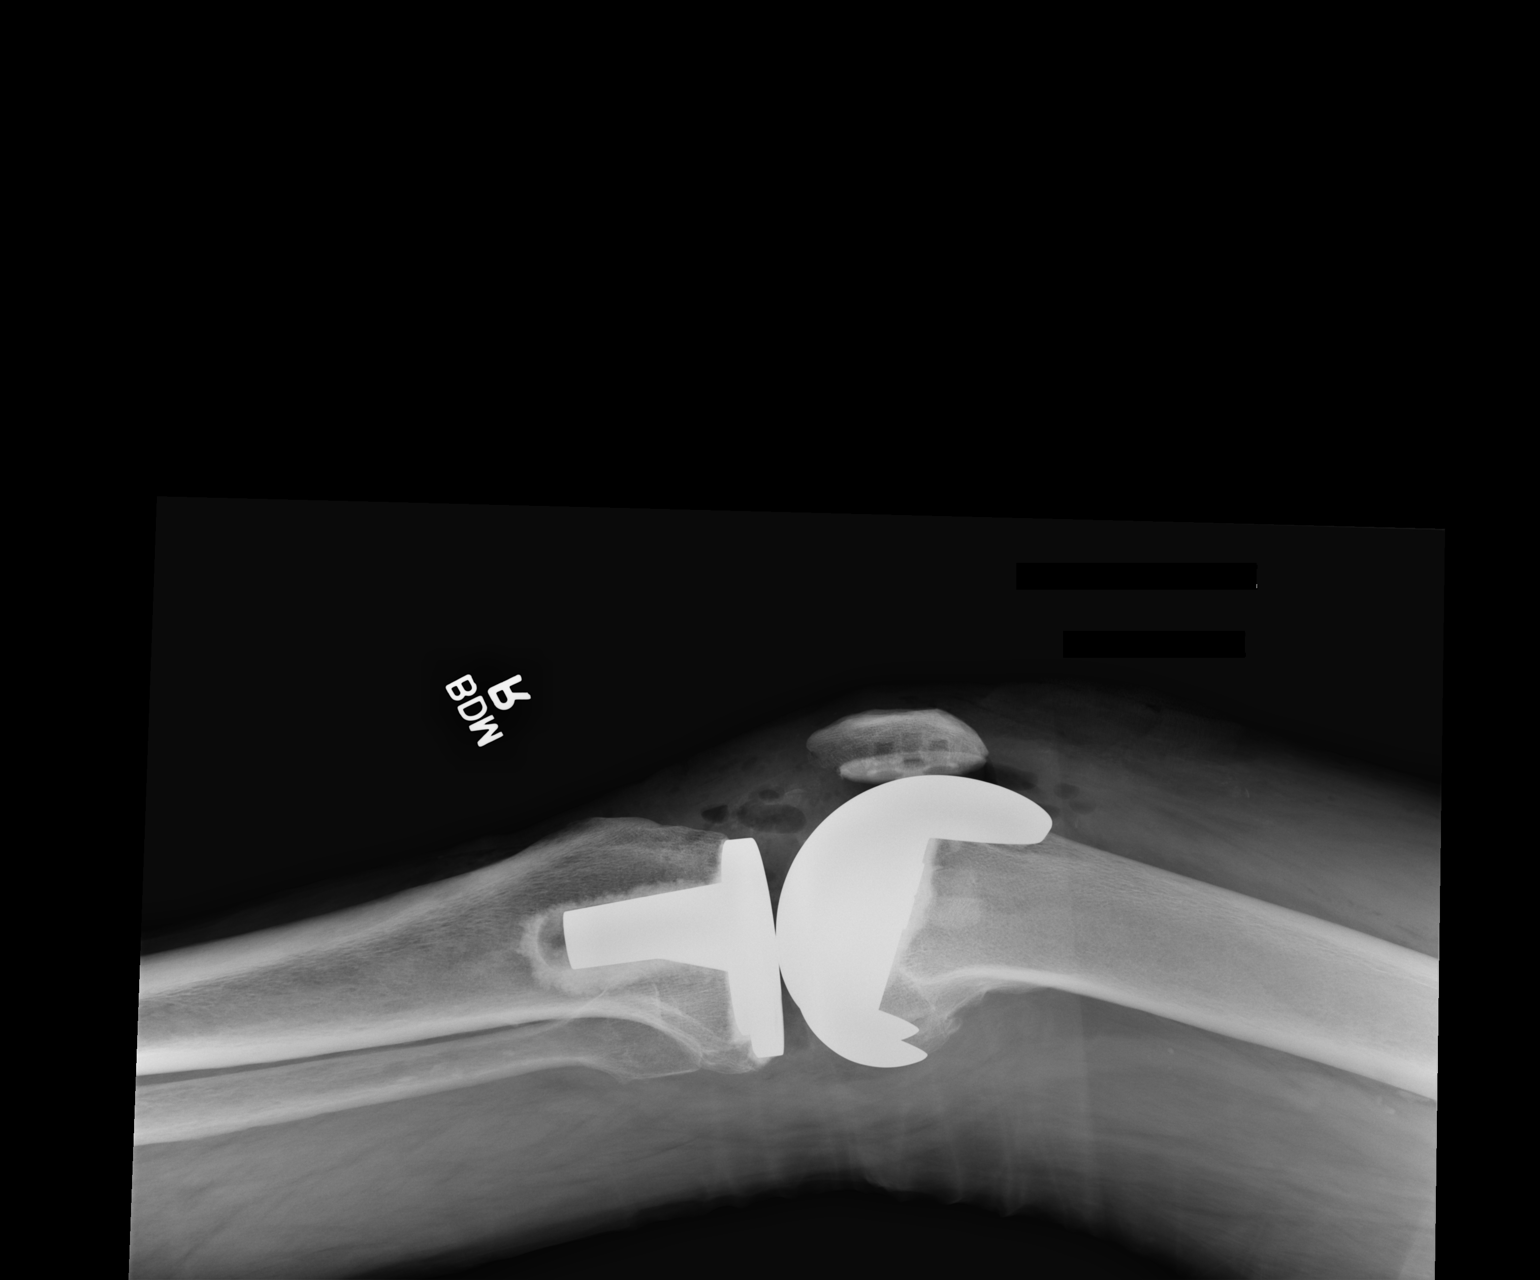

[2 of 2 positions shown; findings below may reference images not displayed]

FINDINGS: The patient has undergone a right total knee prosthesis insertion.
The components appear in good position. No fractures.
IMPRESSION: Satisfactory appearance of the left knee after total knee prosthesis
insertion.

## 2015-02-20 IMAGING — CR DG ABDOMEN 1V
1 series · 1 of 1 positions shown · non-contrast
Comparison: PET CT 03/05/2011

CLINICAL DATA: Abdominal pain and distension evaluate for ileus

ABDOMEN - 1 VIEW

[x abdomen supine]
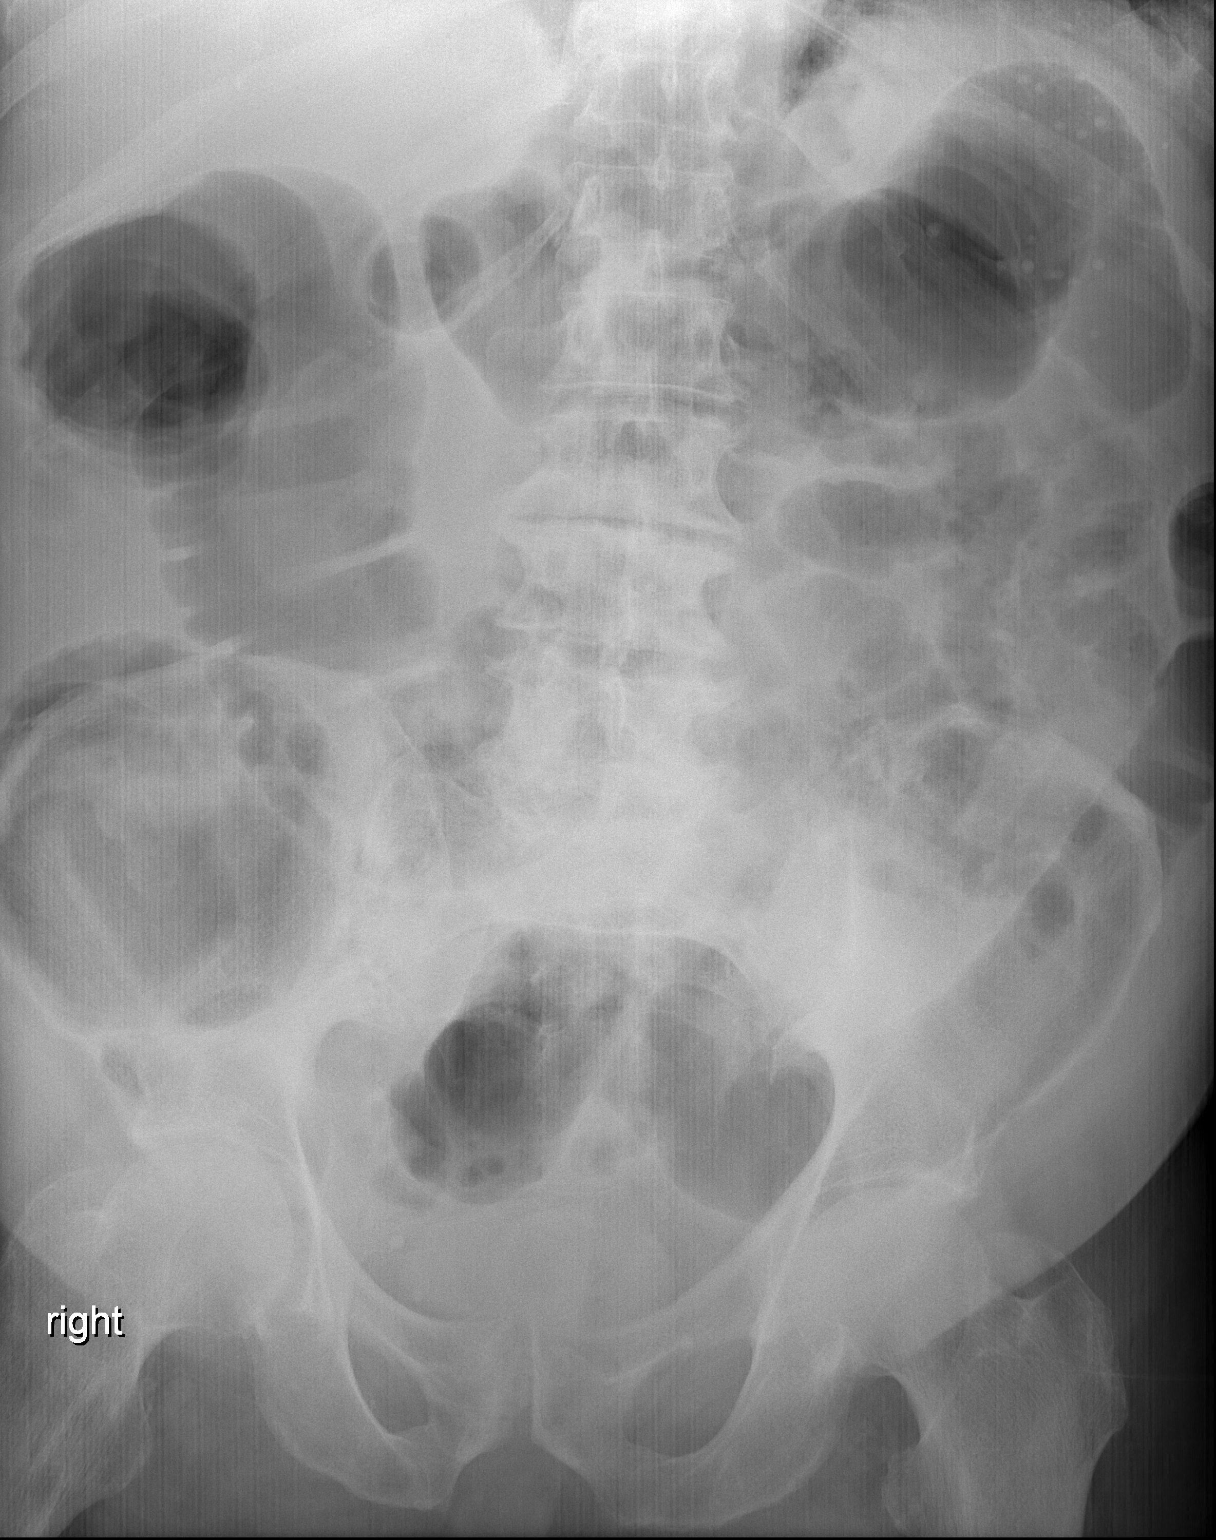

[1 of 1 positions shown; findings below may reference images not displayed]

FINDINGS: Mild gaseous distension of the colon extending from the
cecum to the upper rectum.  Gas within several loops of nondilated
small bowel as well.  Numerous round calcifications in the left
upper quadrant consistent with prior granulomatous disease of the
spleen.  Lower lumbar degenerative disc disease.  No acute osseous
abnormality.
IMPRESSION: Nonspecific gas filled but not particularly distended colon.
Early ileus is not excluded.

## 2015-05-26 ENCOUNTER — Encounter: Payer: Self-pay | Admitting: Cardiology
